# Patient Record
Sex: Male | Born: 1945
Health system: Southern US, Community
[De-identification: ages and names within clinical notes are randomized; demographics above are authoritative.]

## PROBLEM LIST (undated history)

## (undated) DIAGNOSIS — I10 Essential (primary) hypertension: Secondary | ICD-10-CM

## (undated) DIAGNOSIS — I509 Heart failure, unspecified: Secondary | ICD-10-CM

## (undated) DIAGNOSIS — R030 Elevated blood-pressure reading, without diagnosis of hypertension: Secondary | ICD-10-CM

## (undated) DIAGNOSIS — E119 Type 2 diabetes mellitus without complications: Secondary | ICD-10-CM

## (undated) DIAGNOSIS — K439 Ventral hernia without obstruction or gangrene: Secondary | ICD-10-CM

## (undated) DIAGNOSIS — IMO0001 Reserved for inherently not codable concepts without codable children: Secondary | ICD-10-CM

## (undated) DIAGNOSIS — I499 Cardiac arrhythmia, unspecified: Secondary | ICD-10-CM

## (undated) DIAGNOSIS — R42 Dizziness and giddiness: Secondary | ICD-10-CM

## (undated) DIAGNOSIS — N400 Enlarged prostate without lower urinary tract symptoms: Secondary | ICD-10-CM

## (undated) HISTORY — DX: Type 2 diabetes mellitus without complications: E11.9

## (undated) HISTORY — DX: Heart failure, unspecified: I50.9

## (undated) HISTORY — DX: Elevated blood-pressure reading, without diagnosis of hypertension: R03.0

## (undated) HISTORY — DX: Essential (primary) hypertension: I10

## (undated) HISTORY — DX: Benign prostatic hyperplasia without lower urinary tract symptoms: N40.0

## (undated) HISTORY — DX: Reserved for inherently not codable concepts without codable children: IMO0001

## (undated) HISTORY — DX: Dizziness and giddiness: R42

## (undated) HISTORY — DX: Cardiac arrhythmia, unspecified: I49.9

---

## 2015-10-16 DIAGNOSIS — S20469A Insect bite (nonvenomous) of unspecified back wall of thorax, initial encounter: Secondary | ICD-10-CM | POA: Diagnosis not present

## 2015-10-16 DIAGNOSIS — W57XXXA Bitten or stung by nonvenomous insect and other nonvenomous arthropods, initial encounter: Secondary | ICD-10-CM | POA: Diagnosis not present

## 2016-02-12 ENCOUNTER — Encounter (HOSPITAL_COMMUNITY): Payer: Self-pay | Admitting: *Deleted

## 2016-02-12 ENCOUNTER — Emergency Department (HOSPITAL_COMMUNITY): Payer: Medicare Other

## 2016-02-12 ENCOUNTER — Emergency Department (HOSPITAL_COMMUNITY)
Admission: EM | Admit: 2016-02-12 | Discharge: 2016-02-13 | Disposition: A | Discharge status: Home / Self Care | Payer: Medicare Other | Attending: Emergency Medicine | Admitting: Emergency Medicine

## 2016-02-12 DIAGNOSIS — R110 Nausea: Secondary | ICD-10-CM | POA: Insufficient documentation

## 2016-02-12 DIAGNOSIS — E669 Obesity, unspecified: Secondary | ICD-10-CM | POA: Diagnosis not present

## 2016-02-12 DIAGNOSIS — R42 Dizziness and giddiness: Secondary | ICD-10-CM | POA: Insufficient documentation

## 2016-02-12 DIAGNOSIS — R5383 Other fatigue: Secondary | ICD-10-CM | POA: Insufficient documentation

## 2016-02-12 DIAGNOSIS — R404 Transient alteration of awareness: Secondary | ICD-10-CM | POA: Diagnosis not present

## 2016-02-12 LAB — CBC
HCT: 41.5 % (ref 39.0–52.0)
Hemoglobin: 14.1 g/dL (ref 13.0–17.0)
MCH: 31.7 pg (ref 26.0–34.0)
MCHC: 34 g/dL (ref 30.0–36.0)
MCV: 93.3 fL (ref 78.0–100.0)
PLATELETS: 206 10*3/uL (ref 150–400)
RBC: 4.45 MIL/uL (ref 4.22–5.81)
RDW: 13.3 % (ref 11.5–15.5)
WBC: 9.5 10*3/uL (ref 4.0–10.5)

## 2016-02-12 NOTE — ED Notes (Signed)
Pt to ED from home by EMS c/o dizziness and weakness x 2 days, worsening today around 4pm. Has had nausea with one episode of emesis, in which nausea improved after emesis. Initial EMS ekg was sinus brady at rate of 52 with BBB, following EKG NSR with rate of 66. Pt denies nausea, no neuro deficits on exam

## 2016-02-12 NOTE — ED Provider Notes (Signed)
By signing my name below, I, Freida Busman, attest that this documentation has been prepared under the direction and in the presence of Osceola Depaz N Madigan Rosensteel, DO . Electronically Signed: Freida Busman, Scribe. 02/12/2016. 11:31 PM.  TIME SEEN: 11:14 PM  CHIEF COMPLAINT:  Chief Complaint  Patient presents with  . Dizziness  . Fatigue   HPI:  HPI Comments:  Adam Jennings is a 70 y.o. male who presents to the Emergency Department complaining dizziness that began this evening and lasted for 1 hour. Pt reports associated nausea without episode of vomiting. He describes the sensation as feeling like he's had too much to drink. He also notes "hot sweats" with the nausea. Pt states at this time all symptoms have resolved. He denies CP, abdominal pain, SOB, numbness/tingling/focal weakness, speech changes, hearing loss, tinnitus, severe HA and blood in his stool, melena. States he had a mild frontal headache with these symptoms that is gone. No modifying factors . He also denies h/o vertigo, DM, HTN, HLD, and CAD, CVA. Not on anticoagulation.  PCP was Dr. Jeanie Sewer, sees unknown physician in [place]  ROS: See HPI Constitutional: no fever  Eyes: no drainage  ENT: no runny nose   Cardiovascular:  no chest pain  Resp: no SOB  GI: no vomiting GU: no dysuria Integumentary: no rash  Allergy: no hives  Musculoskeletal: no leg swelling  Neurological: no slurred speech ROS otherwise negative  PAST MEDICAL HISTORY/PAST SURGICAL HISTORY:  History reviewed. No pertinent past medical history.  MEDICATIONS:  Prior to Admission medications   Not on File    ALLERGIES:  No Known Allergies  SOCIAL HISTORY:  Social History  Substance Use Topics  . Smoking status: Never Smoker   . Smokeless tobacco: Not on file  . Alcohol Use: No    FAMILY HISTORY: No family history on file.  EXAM: BP 150/67 mmHg  Pulse 56  Temp(Src) 97.6 F (36.4 C)  Resp 16  Ht 6' (1.829 m)  Wt 350 lb (158.759 kg)  BMI  47.46 kg/m2  SpO2 94% CONSTITUTIONAL: Alert and oriented and responds appropriately to questions. Well-appearing; obese HEAD: Normocephalic EYES: Conjunctivae clear, PERRL; strabismus of the right eye; EOMI ENT: normal nose; no rhinorrhea; moist mucous membranes NECK: Supple, no meningismus, no LAD  CARD: RRR; S1 and S2 appreciated; no murmurs, no clicks, no rubs, no gallops RESP: Normal chest excursion without splinting or tachypnea; breath sounds clear and equal bilaterally; no wheezes, no rhonchi, no rales, no hypoxia or respiratory distress, speaking full sentences ABD/GI: Normal bowel sounds; non-distended; soft, non-tender, no rebound, no guarding, no peritoneal signs BACK:  The back appears normal and is non-tender to palpation, there is no CVA tenderness EXT: Normal ROM in all joints; non-tender to palpation; no edema; normal capillary refill; no cyanosis, no calf tenderness or swelling    SKIN: Normal color for age and race; warm; no rash NEURO: Moves all extremities equally, sensation to light touch intact diffusely, cranial nerves II through XII intact; strength 5/5 all 4 extremites  PSYCH: The patient's mood and manner are appropriate. Grooming and personal hygiene are appropriate.   EKG Interpretation  Date/Time:  Tuesday February 12 2016 23:01:34 EDT Ventricular Rate:  55 PR Interval:  184 QRS Duration: 156 QT Interval:  505 QTC Calculation: 483 R Axis:   -62 Text Interpretation:  Sinus rhythm IVCD, consider atypical RBBB LVH with IVCD and secondary repol abnrm Borderline prolonged QT interval No old tracing to compare Confirmed by Wynonia Medero,  DO, Tanaka Gillen ([id number])  on 02/12/2016 11:07:01 PM       MEDICAL DECISION MAKING: Pt here With dizziness described as what sounds like vertigo, generalized weakness. Feeling better now. Asymptomatic currently. Symptoms lasted for 1 hour. Had mild frontal headache with this. Hemodynamically stable. Neurologically intact. They have been a brief  episode of BPPV versus TIA. Reports he felt like he was leaning to the left. Will obtain labs, urine, head CT. EKG shows intraventricular conduction delay with old for comparison.  ED PROGRESS: 12:45 AM  Pt's labs are unremarkable. Head CT shows no acute abnormality. He is still a symptomatic. Orthostatic vital signs negative. Will consult neurology for further recommendations.   1:00 AM  D/w Dr. Roseanne RenoStewart who is on-call for neurology. Given patient is still asymptomatic, he feels this is likely peripheral vertigo. Unlikely stroke which I agree given no symptoms currently. He does not recommend further imaging given patient's low risk factors. Recommended outpatient follow-up. He has been able to ambulate without difficulty.  2:00 AM  Pt is still symptomatic. Been able to drink without difficulty or vomiting.  Urine shows no sign of infection, no ketones. I feel he is safe to be discharged. Discussed return precautions with patient and family. Recommended outpatient follow-up with his primary care provider but have also provided him with outpatient neurology follow-up. Patient and family verbalize understanding and are comfortable with this plan.  I personally performed the services described in this documentation, which was scribed in my presence. The recorded information has been reviewed and is accurate.    Layla MawKristen N Phillippe Orlick, DO 02/13/16 [phone]

## 2016-02-13 DIAGNOSIS — R42 Dizziness and giddiness: Secondary | ICD-10-CM | POA: Diagnosis not present

## 2016-02-13 LAB — URINALYSIS, ROUTINE W REFLEX MICROSCOPIC
BILIRUBIN URINE: NEGATIVE
GLUCOSE, UA: NEGATIVE mg/dL
Hgb urine dipstick: NEGATIVE
KETONES UR: NEGATIVE mg/dL
LEUKOCYTES UA: NEGATIVE
NITRITE: NEGATIVE
PROTEIN: NEGATIVE mg/dL
Specific Gravity, Urine: 1.02 (ref 1.005–1.030)
pH: 6.5 (ref 5.0–8.0)

## 2016-02-13 LAB — BASIC METABOLIC PANEL
Anion gap: 7 (ref 5–15)
BUN: 11 mg/dL (ref 6–20)
CALCIUM: 9.3 mg/dL (ref 8.9–10.3)
CO2: 26 mmol/L (ref 22–32)
CREATININE: 0.94 mg/dL (ref 0.61–1.24)
Chloride: 105 mmol/L (ref 101–111)
GFR calc Af Amer: >60 mL/min (ref >60)
GLUCOSE: 158 mg/dL — AB (ref 65–99)
Potassium: 4.2 mmol/L (ref 3.5–5.1)
SODIUM: 138 mmol/L (ref 135–145)

## 2016-02-13 LAB — TROPONIN I: Troponin I: <0.03 ng/mL (ref <0.031)

## 2016-02-13 NOTE — ED Notes (Signed)
Ambulated Pt to bathroom. PT did not feel dizzy and claimed to feel much better/normal. RN notified.

## 2016-02-13 NOTE — Discharge Instructions (Signed)

## 2016-02-25 ENCOUNTER — Ambulatory Visit (INDEPENDENT_AMBULATORY_CARE_PROVIDER_SITE_OTHER): Payer: Medicare Other | Admitting: Primary Care

## 2016-02-25 ENCOUNTER — Encounter: Payer: Self-pay | Admitting: Primary Care

## 2016-02-25 VITALS — BP 162/90 | HR 67 | Temp 98.0°F | Ht 72.0 in | Wt 333.0 lb

## 2016-02-25 DIAGNOSIS — I10 Essential (primary) hypertension: Secondary | ICD-10-CM

## 2016-02-25 MED ORDER — LISINOPRIL 20 MG PO TABS: 20.0000 mg | ORAL_TABLET | Freq: Every day | ORAL | Status: DC

## 2016-02-25 NOTE — Progress Notes (Signed)
Subjective:    Patient ID: Adam Jennings, male    DOB: 14-Sep-1946, 70 y.o.   MRN: [id number]  HPI  Adam Jennings is a 70 year old male who presents today to establish care and discuss the problems mentioned below. Will obtain old records. His last physical was 3 years ago.   1) Elevated Blood Pressure: Elevated in the clinic today at 162/90. Denies prior history of Hypertension. His BP was elevated in the Emergency Department at 150/67, and 151/74 upon recheck. He endorses a headache to his bilateral occipital lobes that has been constant since his visit to the Emergency Department for dizziness on 02/12/16. He's taken Excedrin Migraine for his headaches with improvement for 1-2 hours. Denies chest pain, SOB, lower extremity swelling, changes in vision. FH of hypertension with his father. Denies FH of MI, CAD, Stroke.  2) Hospital Follow Up: Evaluated in the ED on 02/12/16 with a chief complaint of dizziness and fatigue. His dizziness had been present for 1 hour with nausea. Denies chest pain, SOB. His BP was slightly above goal in the ED at 150/67 that day. He underwent ECG which showed questionable RBBB with LVH, borderline prolonged QT. He underwent labs including Troponin, CBC, BMP, and UA which were unremarkable. CT scan of head was unremarkable. The ED physician did consult with neurology who believed no further work up was necessary. He was provided with instructions for outpatient follow up upon discharge if symptoms persisted. He was discharged early morning of 02/13/16.  Since his discharge home he's experienced 1 episode of dizziness 2 days after discharge, denies dizziness since. Denies numbness/tingling, changes in vision, chest pain. His main complaint today is the persistent headache to his occipital lobes since ED visit.  Review of Systems  Eyes: Negative for photophobia and visual disturbance.  Respiratory: Negative for shortness of breath.   Cardiovascular: Negative for chest pain and  leg swelling.  Gastrointestinal:       Endorses periumbilical hernia for 12 years  Musculoskeletal:       Chronic knee pain to right knee.   Neurological: Positive for headaches. Negative for dizziness.       Past Medical History  Diagnosis Date  . Elevated blood pressure   . Dizziness     Social History   Social History  . Marital Status: Married    Spouse Name: N/A  . Number of Children: N/A  . Years of Education: N/A   Occupational History  . Not on file.   Social History Main Topics  . Smoking status: Never Smoker   . Smokeless tobacco: Not on file  . Alcohol Use: No  . Drug Use: No  . Sexual Activity: Not on file   Other Topics Concern  . Not on file   Social History Narrative   Married.   1 daughter, 4 grandchildren.   Works in [employer] in [place].   Enjoys working on tractors.     No past surgical history on file.  Family History  Problem Relation Age of Onset  . Hypertension Father     No Known Allergies  Current Outpatient Prescriptions on File Prior to Visit  Medication Sig Dispense Refill  . Omega-3 Fatty Acids (FISH OIL) 1000 MG CAPS Take 1,000 mg by mouth daily.     No current facility-administered medications on file prior to visit.    BP 162/90 mmHg  Pulse 67  Temp(Src) 98 F (36.7 C) (Oral)  Ht 6' (1.829 m)  Wt 333 lb (151.048  kg)  BMI 45.15 kg/m2  SpO2 95%    Objective:   Physical Exam  Constitutional: He appears well-nourished.  Eyes: EOM are normal. Pupils are equal, round, and reactive to light.  Neck: Neck supple.  Cardiovascular: Normal rate and regular rhythm.   Pulmonary/Chest: Effort normal and breath sounds normal.  Neurological: No cranial nerve deficit.  Skin: Skin is warm and dry.  Psychiatric: He has a normal mood and affect.          Assessment & Plan:  Hospital Follow Up:  Evaluated in ED on 02/12/16 for dizziness and nausea. BP noted to be slightly above goal, even on recheck. All labs and imaging  unremarkable. ECG abnormal, no prior ECG on file to compare. Dizziness has dissipated since, now complains of headache. Exam unremarkable. Suspect headache, and possibly dizziness are related to undiagnosed HTN which is addressed in another section. Return precautions provided regarding dizziness.  All labs, imaging, and hospital notes reviewed.

## 2016-02-25 NOTE — Assessment & Plan Note (Signed)
No prior history, FH of HTN with father. Elevated today in clinic, elevated on 2 separate readings in ED 2 weeks ago. Suspect his headaches and dizziness are related and will treat. Start Lisinopril 20 mg tablets.  Also discussed importance of low salt diet and exercise. Follow up in 2 weeks for BMP and evaluation.

## 2016-02-25 NOTE — Progress Notes (Signed)
Pre visit review using our clinic review tool, if applicable. No additional management support is needed unless otherwise documented below in the visit note. 

## 2016-02-25 NOTE — Patient Instructions (Signed)
Start Lisinopril 20 mg for high blood pressure. Take 1 tablet by mouth every morning.  Take a look at the information below regarding a proper diet. Ensure to limit salty, fried, and fatty foods.  Follow up in 2 weeks for recheck of your blood pressure.  It was a pleasure to meet you today! Please don't hesitate to call me with any questions. Welcome to [facility]!   DASH Eating Plan DASH stands for "Dietary Approaches to Stop Hypertension." The DASH eating plan is a healthy eating plan that has been shown to reduce high blood pressure (hypertension). Additional health benefits may include reducing the risk of type 2 diabetes mellitus, heart disease, and stroke. The DASH eating plan may also help with weight loss. WHAT DO I NEED TO KNOW ABOUT THE DASH EATING PLAN? For the DASH eating plan, you will follow these general guidelines:  Choose foods with a percent daily value for sodium of less than 5% (as listed on the food label).  Use salt-free seasonings or herbs instead of table salt or sea salt.  Check with your health care provider or pharmacist before using salt substitutes.  Eat lower-sodium products, often labeled as "lower sodium" or "no salt added."  Eat fresh foods.  Eat more vegetables, fruits, and low-fat dairy products.  Choose whole grains. Look for the word "whole" as the first word in the ingredient list.  Choose fish and skinless chicken or [place] more often than red meat. Limit fish, poultry, and meat to 6 oz (170 g) each day.  Limit sweets, desserts, sugars, and sugary drinks.  Choose heart-healthy fats.  Limit cheese to 1 oz (28 g) per day.  Eat more home-cooked food and less restaurant, buffet, and fast food.  Limit fried foods.  Cook foods using methods other than frying.  Limit canned vegetables. If you do use them, rinse them well to decrease the sodium.  When eating at a restaurant, ask that your food be prepared with less salt, or no salt if  possible. WHAT FOODS CAN I EAT? Seek help from a dietitian for individual calorie needs. Grains Whole grain or whole wheat bread. Brown rice. Whole grain or whole wheat pasta. Quinoa, bulgur, and whole grain cereals. Low-sodium cereals. Corn or whole wheat flour tortillas. Whole grain cornbread. Whole grain crackers. Low-sodium crackers. Vegetables Fresh or frozen vegetables (raw, steamed, roasted, or grilled). Low-sodium or reduced-sodium tomato and vegetable juices. Low-sodium or reduced-sodium tomato sauce and paste. Low-sodium or reduced-sodium canned vegetables.  Fruits All fresh, canned (in natural juice), or frozen fruits. Meat and Other Protein Products Ground beef (85% or leaner), grass-fed beef, or beef trimmed of fat. Skinless chicken or [place]. Ground chicken or [place]. Pork trimmed of fat. All fish and seafood. Eggs. Dried beans, peas, or lentils. Unsalted nuts and seeds. Unsalted canned beans. Dairy Low-fat dairy products, such as skim or 1% milk, 2% or reduced-fat cheeses, low-fat ricotta or cottage cheese, or plain low-fat yogurt. Low-sodium or reduced-sodium cheeses. Fats and Oils Tub margarines without trans fats. Light or reduced-fat mayonnaise and salad dressings (reduced sodium). Avocado. Safflower, olive, or canola oils. Natural peanut or almond butter. Other Unsalted popcorn and pretzels. The items listed above may not be a complete list of recommended foods or beverages. Contact your dietitian for more options. WHAT FOODS ARE NOT RECOMMENDED? Grains White bread. White pasta. White rice. Refined cornbread. Bagels and croissants. Crackers that contain trans fat. Vegetables Creamed or fried vegetables. Vegetables in a cheese sauce. Regular canned vegetables. Regular  canned tomato sauce and paste. Regular tomato and vegetable juices. Fruits Dried fruits. Canned fruit in light or heavy syrup. Fruit juice. Meat and Other Protein Products Fatty cuts of meat. Ribs, chicken  wings, bacon, sausage, bologna, salami, chitterlings, fatback, hot dogs, bratwurst, and packaged luncheon meats. Salted nuts and seeds. Canned beans with salt. Dairy Whole or 2% milk, cream, half-and-half, and cream cheese. Whole-fat or sweetened yogurt. Full-fat cheeses or blue cheese. Nondairy creamers and whipped toppings. Processed cheese, cheese spreads, or cheese curds. Condiments Onion and garlic salt, seasoned salt, table salt, and sea salt. Canned and packaged gravies. Worcestershire sauce. Tartar sauce. Barbecue sauce. Teriyaki sauce. Soy sauce, including reduced sodium. Steak sauce. Fish sauce. Oyster sauce. Cocktail sauce. Horseradish. Ketchup and mustard. Meat flavorings and tenderizers. Bouillon cubes. Hot sauce. Tabasco sauce. Marinades. Taco seasonings. Relishes. Fats and Oils Butter, stick margarine, lard, shortening, ghee, and bacon fat. Coconut, palm kernel, or palm oils. Regular salad dressings. Other Pickles and olives. Salted popcorn and pretzels. The items listed above may not be a complete list of foods and beverages to avoid. Contact your dietitian for more information. WHERE CAN I FIND MORE INFORMATION? National Heart, Lung, and Blood Institute: [employer].itwww.nhlbi.nih.gov/health/health-topics/topics/dash/   This information is not intended to replace advice given to you by your health care provider. Make sure you discuss any questions you have with your health care provider.   Document Released: 10/30/2011 Document Revised: 12/01/2014 Document Reviewed: 09/14/2013 Elsevier Interactive Patient Education [employer]2016 Elsevier Inc.

## 2016-03-10 ENCOUNTER — Ambulatory Visit (INDEPENDENT_AMBULATORY_CARE_PROVIDER_SITE_OTHER): Payer: Medicare Other | Admitting: Primary Care

## 2016-03-10 ENCOUNTER — Encounter: Payer: Self-pay | Admitting: Primary Care

## 2016-03-10 VITALS — BP 144/84 | HR 64 | Temp 98.1°F | Ht 72.0 in | Wt 332.0 lb

## 2016-03-10 DIAGNOSIS — I10 Essential (primary) hypertension: Secondary | ICD-10-CM | POA: Diagnosis not present

## 2016-03-10 LAB — BASIC METABOLIC PANEL
BUN: 14 mg/dL (ref 6–23)
CALCIUM: 9.6 mg/dL (ref 8.4–10.5)
CHLORIDE: 103 meq/L (ref 96–112)
CO2: 29 mEq/L (ref 19–32)
CREATININE: 1.03 mg/dL (ref 0.40–1.50)
GFR: 76.27 mL/min (ref >60.00)
Glucose, Bld: 175 mg/dL — ABNORMAL HIGH (ref 70–99)
Potassium: 4.2 mEq/L (ref 3.5–5.1)
Sodium: 140 mEq/L (ref 135–145)

## 2016-03-10 NOTE — Assessment & Plan Note (Signed)
Stable and improved on Lisinopril 20 mg per current guidelines. Will continue Lisinopril for now, check BMP today. Will complete physical and BP recheck in 3 months.

## 2016-03-10 NOTE — Progress Notes (Signed)
   Subjective:    Patient ID: Adam Jennings, male    DOB: 02/09/1946, 70 y.o.   MRN: [id number]  HPI  Adam Jennings is a 70 year old male who presents today for follow up of hypertension. He was evaluated as a new patient on 02/25/16 and noted to have recent and prior elevated BP readings with dizziness and headaches. He was initiated on Lisinopril 20 mg last visit.   Since his last visit his BP is stable at 144/84 in the office today. He's not checked his BP since his last visit. His headaches had dissipated 3 days ago and he's feeling much better overall. Denies chest pain, dizziness, cough.   Review of Systems  Respiratory: Negative for cough and shortness of breath.   Cardiovascular: Negative for chest pain.  Neurological: Negative for dizziness and headaches.       Past Medical History  Diagnosis Date  . Elevated blood pressure   . Dizziness      Social History   Social History  . Marital Status: Married    Spouse Name: N/A  . Number of Children: N/A  . Years of Education: N/A   Occupational History  . Not on file.   Social History Main Topics  . Smoking status: Never Smoker   . Smokeless tobacco: Not on file  . Alcohol Use: No  . Drug Use: No  . Sexual Activity: Not on file   Other Topics Concern  . Not on file   Social History Narrative   Married.   1 daughter, 4 grandchildren.   Works in [employer] in [place].   Enjoys working on tractors.     No past surgical history on file.  Family History  Problem Relation Age of Onset  . Hypertension Father     No Known Allergies  Current Outpatient Prescriptions on File Prior to Visit  Medication Sig Dispense Refill  . lisinopril (PRINIVIL,ZESTRIL) 20 MG tablet Take 1 tablet (20 mg total) by mouth daily. 30 tablet 3  . Omega-3 Fatty Acids (FISH OIL) 1000 MG CAPS Take 1,000 mg by mouth daily.     No current facility-administered medications on file prior to visit.    BP 144/84 mmHg  Pulse 64  Temp(Src) 98.1 F  (36.7 C) (Oral)  Ht 6' (1.829 m)  Wt 332 lb (150.594 kg)  BMI 45.02 kg/m2  SpO2 96%    Objective:   Physical Exam  Constitutional: He appears well-nourished.  Cardiovascular: Normal rate and regular rhythm.   Pulmonary/Chest: Effort normal and breath sounds normal.  Skin: Skin is warm and dry.          Assessment & Plan:

## 2016-03-10 NOTE — Patient Instructions (Signed)
Complete lab work prior to leaving today. I will notify you of your results once received.   Please schedule a physical with me in 3 months. We will also follow up on your blood pressure at that time. You may also schedule a lab only appointment 3-4 days prior. We will discuss your lab results in detail during your physical.  It was a pleasure to see you today!  Hypertension Hypertension, commonly called high blood pressure, is when the force of blood pumping through your arteries is too strong. Your arteries are the blood vessels that carry blood from your heart throughout your body. A blood pressure reading consists of a higher number over a lower number, such as 110/72. The higher number (systolic) is the pressure inside your arteries when your heart pumps. The lower number (diastolic) is the pressure inside your arteries when your heart relaxes. Ideally you want your blood pressure below 120/80. Hypertension forces your heart to work harder to pump blood. Your arteries may become narrow or stiff. Having untreated or uncontrolled hypertension can cause heart attack, stroke, kidney disease, and other problems. RISK FACTORS Some risk factors for high blood pressure are controllable. Others are not.  Risk factors you cannot control include:   Race. You may be at higher risk if you are African American.  Age. Risk increases with age.  Gender. Men are at higher risk than women before age 68 years. After age 4, women are at higher risk than men. Risk factors you can control include:  Not getting enough exercise or physical activity.  Being overweight.  Getting too much fat, sugar, calories, or salt in your diet.  Drinking too much alcohol. SIGNS AND SYMPTOMS Hypertension does not usually cause signs or symptoms. Extremely high blood pressure (hypertensive crisis) may cause headache, anxiety, shortness of breath, and nosebleed. DIAGNOSIS To check if you have hypertension, your health care  provider will measure your blood pressure while you are seated, with your arm held at the level of your heart. It should be measured at least twice using the same arm. Certain conditions can cause a difference in blood pressure between your right and left arms. A blood pressure reading that is higher than normal on one occasion does not mean that you need treatment. If it is not clear whether you have high blood pressure, you may be asked to return on a different day to have your blood pressure checked again. Or, you may be asked to monitor your blood pressure at home for 1 or more weeks. TREATMENT Treating high blood pressure includes making lifestyle changes and possibly taking medicine. Living a healthy lifestyle can help lower high blood pressure. You may need to change some of your habits. Lifestyle changes may include:  Following the DASH diet. This diet is high in fruits, vegetables, and whole grains. It is low in salt, red meat, and added sugars.  Keep your sodium intake below 2,300 mg per day.  Getting at least 30-45 minutes of aerobic exercise at least 4 times per week.  Losing weight if necessary.  Not smoking.  Limiting alcoholic beverages.  Learning ways to reduce stress. Your health care provider may prescribe medicine if lifestyle changes are not enough to get your blood pressure under control, and if one of the following is true:  You are 69-58 years of age and your systolic blood pressure is above 140.  You are 77 years of age or older, and your systolic blood pressure is above 150.  Your diastolic blood pressure is above 90.  You have diabetes, and your systolic blood pressure is over 140 or your diastolic blood pressure is over 90.  You have kidney disease and your blood pressure is above 140/90.  You have heart disease and your blood pressure is above 140/90. Your personal target blood pressure may vary depending on your medical conditions, your age, and other  factors. HOME CARE INSTRUCTIONS  Have your blood pressure rechecked as directed by your health care provider.   Take medicines only as directed by your health care provider. Follow the directions carefully. Blood pressure medicines must be taken as prescribed. The medicine does not work as well when you skip doses. Skipping doses also puts you at risk for problems.  Do not smoke.   Monitor your blood pressure at home as directed by your health care provider. SEEK MEDICAL CARE IF:   You think you are having a reaction to medicines taken.  You have recurrent headaches or feel dizzy.  You have swelling in your ankles.  You have trouble with your vision. SEEK IMMEDIATE MEDICAL CARE IF:  You develop a severe headache or confusion.  You have unusual weakness, numbness, or feel faint.  You have severe chest or abdominal pain.  You vomit repeatedly.  You have trouble breathing. MAKE SURE YOU:   Understand these instructions.  Will watch your condition.  Will get help right away if you are not doing well or get worse.   This information is not intended to replace advice given to you by your health care provider. Make sure you discuss any questions you have with your health care provider.   Document Released: 11/10/2005 Document Revised: 03/27/2015 Document Reviewed: 09/02/2013 Elsevier Interactive Patient Education [employer]2016 Elsevier Inc.

## 2016-03-10 NOTE — Progress Notes (Signed)
Pre visit review using our clinic review tool, if applicable. No additional management support is needed unless otherwise documented below in the visit note. 

## 2016-03-11 ENCOUNTER — Encounter: Payer: Self-pay | Admitting: *Deleted

## 2016-04-18 ENCOUNTER — Ambulatory Visit (INDEPENDENT_AMBULATORY_CARE_PROVIDER_SITE_OTHER): Payer: Medicare Other | Admitting: Primary Care

## 2016-04-18 ENCOUNTER — Ambulatory Visit (INDEPENDENT_AMBULATORY_CARE_PROVIDER_SITE_OTHER)
Admission: RE | Admit: 2016-04-18 | Discharge: 2016-04-18 | Disposition: A | Discharge status: Home / Self Care | Payer: Medicare Other | Source: Ambulatory Visit | Attending: Primary Care | Admitting: Primary Care

## 2016-04-18 VITALS — BP 138/70 | HR 63 | Temp 97.7°F | Ht 72.0 in | Wt 329.0 lb

## 2016-04-18 DIAGNOSIS — M25512 Pain in left shoulder: Secondary | ICD-10-CM

## 2016-04-18 DIAGNOSIS — S4992XA Unspecified injury of left shoulder and upper arm, initial encounter: Secondary | ICD-10-CM | POA: Diagnosis not present

## 2016-04-18 NOTE — Progress Notes (Signed)
Pre visit review using our clinic review tool, if applicable. No additional management support is needed unless otherwise documented below in the visit note. 

## 2016-04-18 NOTE — Patient Instructions (Signed)
Complete xray(s) prior to leaving today. I will notify you of your results once received.  Start Ibuprofen 600 mg. Take 600 mg three times daily as needed for pain and inflammation.  Apply a heat/ice to your left shoulder for pain three times daily for about 20-30 minutes.  Please do not lift anything heavier than 5 pounds at this time as you could cause further injury.  I will be in touch later today.  It was a pleasure to see you today!  Shoulder Pain The shoulder is the joint that connects your arms to your body. The bones that form the shoulder joint include the upper arm bone (humerus), the shoulder blade (scapula), and the collarbone (clavicle). The top of the humerus is shaped like a ball and fits into a rather flat socket on the scapula (glenoid cavity). A combination of muscles and strong, fibrous tissues that connect muscles to bones (tendons) support your shoulder joint and hold the ball in the socket. Small, fluid-filled sacs (bursae) are located in different areas of the joint. They act as cushions between the bones and the overlying soft tissues and help reduce friction between the gliding tendons and the bone as you move your arm. Your shoulder joint allows a wide range of motion in your arm. This range of motion allows you to do things like scratch your back or throw a ball. However, this range of motion also makes your shoulder more prone to pain from overuse and injury. Causes of shoulder pain can originate from both injury and overuse and usually can be grouped in the following four categories:  Redness, swelling, and pain (inflammation) of the tendon (tendinitis) or the bursae (bursitis).  Instability, such as a dislocation of the joint.  Inflammation of the joint (arthritis).  Broken bone (fracture). HOME CARE INSTRUCTIONS   Apply ice to the sore area.  Put ice in a plastic bag.  Place a towel between your skin and the bag.  Leave the ice on for 15-20 minutes, 3-4  times per day for the first 2 days, or as directed by your health care provider.  Stop using cold packs if they do not help with the pain.  If you have a shoulder sling or immobilizer, wear it as long as your caregiver instructs. Only remove it to shower or bathe. Move your arm as little as possible, but keep your hand moving to prevent swelling.  Squeeze a soft ball or foam pad as much as possible to help prevent swelling.  Only take over-the-counter or prescription medicines for pain, discomfort, or fever as directed by your caregiver. SEEK MEDICAL CARE IF:   Your shoulder pain increases, or new pain develops in your arm, hand, or fingers.  Your hand or fingers become cold and numb.  Your pain is not relieved with medicines. SEEK IMMEDIATE MEDICAL CARE IF:   Your arm, hand, or fingers are numb or tingling.  Your arm, hand, or fingers are significantly swollen or turn white or blue. MAKE SURE YOU:   Understand these instructions.  Will watch your condition.  Will get help right away if you are not doing well or get worse.   This information is not intended to replace advice given to you by your health care provider. Make sure you discuss any questions you have with your health care provider.   Document Released: 08/20/2005 Document Revised: 12/01/2014 Document Reviewed: 03/05/2015 Elsevier Interactive Patient Education [employer]2016 Elsevier Inc.

## 2016-04-18 NOTE — Progress Notes (Signed)
   Subjective:    Patient ID: Adam Jennings, male    DOB: 02/05/1946, 70 y.o.   MRN: [id number]  HPI  [name] is a 70 year old male who presents today with a chief complaint of shoulder pain. His pain is located to the left shoulder and has been present since his fall about 2 weeks ago. He accidentally tripped and fell down into an embankment landing on his left shoulder which was fully extended.   His pain initially didn't bother him until 1 week ago. He describes his pain as a "toothache" and is worse when laying down, improved with sitting up. He's been lifting heavy objects during the last several weeks at work, even after his injury. His pain is present with any abduction. He's not taken anything for his pain. He's not tried applying ice or heat. His pain is no worse, but is no better.  Review of Systems  Musculoskeletal:       Left shoulder pain  Neurological: Negative for numbness.       Past Medical History  Diagnosis Date  . Elevated blood pressure   . Dizziness      Social History   Social History  . Marital Status: Married    Spouse Name: N/A  . Number of Children: N/A  . Years of Education: N/A   Occupational History  . Not on file.   Social History Main Topics  . Smoking status: Never Smoker   . Smokeless tobacco: Not on file  . Alcohol Use: No  . Drug Use: No  . Sexual Activity: Not on file   Other Topics Concern  . Not on file   Social History Narrative   Married.   1 daughter, 4 grandchildren.   Works in [employer] [place]   Enjoys working on tractors.     No past surgical history on file.  Family History  Problem Relation Age of Onset  . Hypertension Father     No Known Allergies  Current Outpatient Prescriptions on File Prior to Visit  Medication Sig Dispense Refill  . lisinopril (PRINIVIL,ZESTRIL) 20 MG tablet Take 1 tablet (20 mg total) by mouth daily. 30 tablet 3  . Omega-3 Fatty Acids (FISH OIL) 1000 MG CAPS Take 1,000 mg by mouth  daily.     No current facility-administered medications on file prior to visit.    BP 138/70 mmHg  Pulse 63  Temp(Src) 97.7 F (36.5 C) (Oral)  Ht 6' (1.829 m)  Wt 329 lb (149.233 kg)  BMI 44.61 kg/m2  SpO2 93%    Objective:   Physical Exam  Constitutional: He appears well-nourished.  Cardiovascular: Normal rate and regular rhythm.   Pulmonary/Chest: Effort normal and breath sounds normal.  Musculoskeletal:       Left shoulder: He exhibits decreased range of motion, pain and decreased strength. He exhibits no tenderness and no deformity.  Moderate reduction in ROM with any form of abduction.  Skin: Skin is warm and dry.          Assessment & Plan:  Shoulder Pain:  Located to left shoulder x 1 week. Patient fell on fully extended left shoulder 2 weeks ago. Exam today with moderate reduction in ROM with any form of abduction to left shoulder. He's not tried anything OTC for his symptoms. No obvious deformity or dislocation. Xray ordered and is pending. Will have him start Ibuprofen TID, PRN. Apply ice/heat TID. May need MRI.

## 2016-07-11 ENCOUNTER — Other Ambulatory Visit: Payer: Self-pay | Admitting: Primary Care

## 2016-07-11 DIAGNOSIS — I10 Essential (primary) hypertension: Secondary | ICD-10-CM

## 2016-08-20 ENCOUNTER — Telehealth: Payer: Self-pay

## 2016-08-20 NOTE — Telephone Encounter (Signed)
Noted. Per our records, patient is not on a blood thinner unless he takes daily Aspirin.

## 2016-08-20 NOTE — Telephone Encounter (Signed)
Adam Jennings [facility] said pt is at office for tooth extraction. Pt advised Adam Jennings that he thinks he takes a blood thinner that is 2 mg and pinkish color. [facility] and pt does not have blood thinner there either. Pharmacist said the Lisinopril 20 mg is a peach color. I advised Adam Jennings if pt thinks he is on blood thinner need name of med. Adam Jennings will talk with dentist and pt. FYI to Adam ReelKate Clark NP.

## 2016-10-09 ENCOUNTER — Ambulatory Visit (INDEPENDENT_AMBULATORY_CARE_PROVIDER_SITE_OTHER): Payer: Medicare Other | Admitting: Primary Care

## 2016-10-09 ENCOUNTER — Ambulatory Visit (INDEPENDENT_AMBULATORY_CARE_PROVIDER_SITE_OTHER)
Admission: RE | Admit: 2016-10-09 | Discharge: 2016-10-09 | Disposition: A | Discharge status: Home / Self Care | Payer: Medicare Other | Source: Ambulatory Visit | Attending: Primary Care | Admitting: Primary Care

## 2016-10-09 ENCOUNTER — Encounter: Payer: Self-pay | Admitting: Primary Care

## 2016-10-09 VITALS — BP 130/74 | HR 65 | Temp 97.9°F | Ht 72.0 in | Wt 327.4 lb

## 2016-10-09 DIAGNOSIS — G8929 Other chronic pain: Secondary | ICD-10-CM | POA: Diagnosis not present

## 2016-10-09 DIAGNOSIS — M25561 Pain in right knee: Secondary | ICD-10-CM

## 2016-10-09 MED ORDER — DICLOFENAC SODIUM 75 MG PO TBEC: 75.0000 mg | DELAYED_RELEASE_TABLET | Freq: Two times a day (BID) | ORAL | 0 refills | Status: DC

## 2016-10-09 NOTE — Assessment & Plan Note (Signed)
Present since May 2017, worse over last 3-4 weeks. Given injury in May, will obtain plain films for further evaluation.  Discussed to obtain brace for support.  Rx for Diclofenac sent to pharmacy. May consider PT/Sports Med referral.

## 2016-10-09 NOTE — Progress Notes (Signed)
Pre visit review using our clinic review tool, if applicable. No additional management support is needed unless otherwise documented below in the visit note. 

## 2016-10-09 NOTE — Patient Instructions (Signed)
Complete xray(s) prior to leaving today. I will notify you of your results once received.  You may take diclofenac tablets as needed for pain and inflammation. Take 1 tablet by mouth twice daily with meals as needed for pain.  It was a pleasure to see you today!   Knee Pain Knee pain is a very common symptom and can have many causes. Knee pain often goes away when you follow your health care provider's instructions for relieving pain and discomfort at home. However, knee pain can develop into a condition that needs treatment. Some conditions may include:  Arthritis caused by wear and tear (osteoarthritis).  Arthritis caused by swelling and irritation (rheumatoid arthritis or gout).  A cyst or growth in your knee.  An infection in your knee joint.  An injury that will not heal.  Damage, swelling, or irritation of the tissues that support your knee (torn ligaments or tendinitis). If your knee pain continues, additional tests may be ordered to diagnose your condition. Tests may include X-rays or other imaging studies of your knee. You may also need to have fluid removed from your knee. Treatment for ongoing knee pain depends on the cause, but treatment may include:  Medicines to relieve pain or swelling.  Steroid injections in your knee.  Physical therapy.  Surgery. HOME CARE INSTRUCTIONS  Take medicines only as directed by your health care provider.  Rest your knee and keep it raised (elevated) while you are resting.  Do not do things that cause or worsen pain.  Avoid high-impact activities or exercises, such as running, jumping rope, or doing jumping jacks.  Apply ice to the knee area:  Put ice in a plastic bag.  Place a towel between your skin and the bag.  Leave the ice on for 20 minutes, 2-3 times a day.  Ask your health care provider if you should wear an elastic knee support.  Keep a pillow under your knee when you sleep.  Lose weight if you are overweight.  Extra weight can put pressure on your knee.  Do not use any tobacco products, including cigarettes, chewing tobacco, or electronic cigarettes. If you need help quitting, ask your health care provider. Smoking may slow the healing of any bone and joint problems that you may have. SEEK MEDICAL CARE IF:  Your knee pain continues, changes, or gets worse.  You have a fever along with knee pain.  Your knee buckles or locks up.  Your knee becomes more swollen. SEEK IMMEDIATE MEDICAL CARE IF:   Your knee joint feels hot to the touch.  You have chest pain or trouble breathing. This information is not intended to replace advice given to you by your health care provider. Make sure you discuss any questions you have with your health care provider. Document Released: 09/07/2007 Document Revised: 12/01/2014 Document Reviewed: 06/26/2014 Elsevier Interactive Patient Education  2017 [employer]

## 2016-10-09 NOTE — Progress Notes (Signed)
   Subjective:    Patient ID: Adam Jennings, male    DOB: 11/05/1946, 70 y.o.   MRN: [id number]  HPI  [name] is a 70 year old male who presents today with a chief complaint of knee pain. His pain is located to the right knee which has been present since his fall in May 2017. Over the 3-4 weeks his pain has increased, also with temporary weakness until he starts walking for a few minutes.   He denies recent injury/trauma, numbness/tingling. His pain is constant, worse with flexion, also worse with standing after sitting for prolonged periods of time. He's had imaging of his knees several years ago and was told they were "wore out". He's taking "joint pills" OTC without much improvement.   Review of Systems  Cardiovascular: Negative for leg swelling.  Musculoskeletal: Positive for arthralgias.  Skin: Negative for color change.  Neurological: Positive for weakness. Negative for numbness.       Past Medical History:  Diagnosis Date  . Dizziness   . Elevated blood pressure      Social History   Social History  . Marital status: Married    Spouse name: N/A  . Number of children: N/A  . Years of education: N/A   Occupational History  . Not on file.   Social History Main Topics  . Smoking status: Never Smoker  . Smokeless tobacco: Not on file  . Alcohol use No  . Drug use: No  . Sexual activity: Not on file   Other Topics Concern  . Not on file   Social History Narrative   Married.   1 daughter, 4 grandchildren.   Works in [employer] [place]   Enjoys working on tractors.     No past surgical history on file.  Family History  Problem Relation Age of Onset  . Hypertension Father     No Known Allergies  Current Outpatient Prescriptions on File Prior to Visit  Medication Sig Dispense Refill  . lisinopril (PRINIVIL,ZESTRIL) 20 MG tablet TAKE 1 TABLET (20 MG TOTAL) BY MOUTH DAILY. 30 tablet 5  . Omega-3 Fatty Acids (FISH OIL) 1000 MG CAPS Take 1,000 mg by mouth  daily.     No current facility-administered medications on file prior to visit.     BP 130/74   Pulse 65   Temp 97.9 F (36.6 C) (Oral)   Ht 6' (1.829 m)   Wt (!) 327 lb 6.4 oz (148.5 kg)   SpO2 98%   BMI 44.40 kg/m    Objective:   Physical Exam  Constitutional: He appears well-nourished.  Neck: Neck supple.  Cardiovascular: Normal rate.   Pulmonary/Chest: Effort normal.  Musculoskeletal:       Right knee: He exhibits decreased range of motion. He exhibits no swelling, normal alignment and no bony tenderness. No tenderness found.  Decreased ROM with flexion and extension. 5/5 strength bilaterally  Skin: Skin is warm and dry.          Assessment & Plan:

## 2016-11-19 ENCOUNTER — Other Ambulatory Visit: Payer: Self-pay | Admitting: Primary Care

## 2016-11-19 DIAGNOSIS — M25561 Pain in right knee: Principal | ICD-10-CM

## 2016-11-19 DIAGNOSIS — G8929 Other chronic pain: Secondary | ICD-10-CM

## 2017-01-10 ENCOUNTER — Other Ambulatory Visit: Payer: Self-pay | Admitting: Primary Care

## 2017-01-10 DIAGNOSIS — I10 Essential (primary) hypertension: Secondary | ICD-10-CM

## 2017-01-21 ENCOUNTER — Telehealth: Payer: Self-pay | Admitting: Primary Care

## 2017-01-21 NOTE — Telephone Encounter (Signed)
LVM for pt to call back and schedule AWV + labs with Lesia and CPE with PCP. °

## 2017-02-12 DIAGNOSIS — M25562 Pain in left knee: Secondary | ICD-10-CM | POA: Diagnosis not present

## 2017-02-14 NOTE — Telephone Encounter (Signed)
Scheduled 03/05/17

## 2017-03-04 ENCOUNTER — Other Ambulatory Visit: Payer: Self-pay | Admitting: Primary Care

## 2017-03-04 DIAGNOSIS — I10 Essential (primary) hypertension: Secondary | ICD-10-CM

## 2017-03-04 DIAGNOSIS — Z125 Encounter for screening for malignant neoplasm of prostate: Secondary | ICD-10-CM

## 2017-03-05 ENCOUNTER — Encounter (INDEPENDENT_AMBULATORY_CARE_PROVIDER_SITE_OTHER): Payer: Self-pay

## 2017-03-05 ENCOUNTER — Ambulatory Visit (INDEPENDENT_AMBULATORY_CARE_PROVIDER_SITE_OTHER): Payer: Medicare Other

## 2017-03-05 VITALS — BP 124/62 | HR 71 | Temp 98.4°F | Ht 70.25 in | Wt 322.5 lb

## 2017-03-05 DIAGNOSIS — I10 Essential (primary) hypertension: Secondary | ICD-10-CM | POA: Diagnosis not present

## 2017-03-05 DIAGNOSIS — Z0000 Encounter for general adult medical examination without abnormal findings: Secondary | ICD-10-CM | POA: Diagnosis not present

## 2017-03-05 DIAGNOSIS — Z125 Encounter for screening for malignant neoplasm of prostate: Secondary | ICD-10-CM

## 2017-03-05 DIAGNOSIS — M25562 Pain in left knee: Secondary | ICD-10-CM | POA: Diagnosis not present

## 2017-03-05 DIAGNOSIS — Z1159 Encounter for screening for other viral diseases: Secondary | ICD-10-CM

## 2017-03-05 LAB — COMPREHENSIVE METABOLIC PANEL
ALT: 34 U/L (ref 0–53)
AST: 25 U/L (ref 0–37)
Albumin: 4.4 g/dL (ref 3.5–5.2)
Alkaline Phosphatase: 42 U/L (ref 39–117)
BUN: 20 mg/dL (ref 6–23)
CHLORIDE: 104 meq/L (ref 96–112)
CO2: 28 meq/L (ref 19–32)
CREATININE: 1.08 mg/dL (ref 0.40–1.50)
Calcium: 9.5 mg/dL (ref 8.4–10.5)
GFR: 72 mL/min (ref >60.00)
Glucose, Bld: 141 mg/dL — ABNORMAL HIGH (ref 70–99)
Potassium: 4.7 mEq/L (ref 3.5–5.1)
SODIUM: 140 meq/L (ref 135–145)
Total Bilirubin: 0.7 mg/dL (ref 0.2–1.2)
Total Protein: 7.2 g/dL (ref 6.0–8.3)

## 2017-03-05 LAB — LIPID PANEL
CHOL/HDL RATIO: 8
Cholesterol: 255 mg/dL — ABNORMAL HIGH (ref 0–200)
HDL: 32.9 mg/dL — ABNORMAL LOW (ref >39.00)
LDL CALC: 189 mg/dL — AB (ref 0–99)
NonHDL: 222.51
Triglycerides: 169 mg/dL — ABNORMAL HIGH (ref 0.0–149.0)
VLDL: 33.8 mg/dL (ref 0.0–40.0)

## 2017-03-05 LAB — PSA, MEDICARE: PSA: 7.05 ng/mL — AB (ref 0.10–4.00)

## 2017-03-05 NOTE — Progress Notes (Signed)
Pre visit review using our clinic review tool, if applicable. No additional management support is needed unless otherwise documented below in the visit note. 

## 2017-03-05 NOTE — Progress Notes (Signed)
PCP notes:   Health maintenance:  Tetanus - declined Flu vaccine - declined PNA vaccine - declined Hep C screening - completed Colon cancer screening - pt will discuss with PCP at next appt  Abnormal screenings:   None  Patient concerns:   None  Nurse concerns:  None  Next PCP appt:   03/11/17 @ 0815  I reviewed health advisor's note, was available for consultation on the day of service listed in this note, and agree with documentation and plan. Crawford Givens, MD.

## 2017-03-05 NOTE — Patient Instructions (Addendum)
Adam Jennings , Thank you for taking time to come for your Medicare Wellness Visit. I appreciate your ongoing commitment to your health goals. Please review the following plan we discussed and let me know if I can assist you in the future.   These are the goals we discussed: Goals    . Increase physical activity          Starting 03/05/2017, I will continue to walk at least 2.5 hours daily.        This is a list of the screening recommended for you and due dates:  Health Maintenance  Topic Date Due  . Tetanus Vaccine  03/23/2026*  . Pneumonia vaccines (1 of 2 - PCV13) 03/23/2026*  . Colon Cancer Screening  03/06/2027*  . Flu Shot  06/24/2017  .  Hepatitis C: One time screening is recommended by Center for Disease Control  (CDC) for  adults born from [birth year] through 1963.   Completed  *Topic was postponed. The date shown is not the original due date.    Preventive Care for Adults  A healthy lifestyle and preventive care can promote health and wellness. Preventive health guidelines for adults include the following key practices.  . A routine yearly physical is a good way to check with your health care provider about your health and preventive screening. It is a chance to share any concerns and updates on your health and to receive a thorough exam.  . Visit your dentist for a routine exam and preventive care every 6 months. Brush your teeth twice a day and floss once a day. Good oral hygiene prevents tooth decay and gum disease.  . The frequency of eye exams is based on your age, health, family medical history, use  of contact lenses, and other factors. Follow your health care provider's ecommendations for frequency of eye exams.  . Eat a healthy diet. Foods like vegetables, fruits, whole grains, low-fat dairy products, and lean protein foods contain the nutrients you need without too many calories. Decrease your intake of foods high in solid fats, added sugars, and salt. Eat the right amount  of calories for you. Get information about a proper diet from your health care provider, if necessary.  . Regular physical exercise is one of the most important things you can do for your health. Most adults should get at least 150 minutes of moderate-intensity exercise (any activity that increases your heart rate and causes you to sweat) each week. In addition, most adults need muscle-strengthening exercises on 2 or more days a week.  Silver Sneakers may be a benefit available to you. To determine eligibility, you may visit the website: www.silversneakers.com or contact program at 531-401-9060 Mon-Fri between 8AM-8PM.   . Maintain a healthy weight. The body mass index (BMI) is a screening tool to identify possible weight problems. It provides an estimate of body fat based on height and weight. Your health care provider can find your BMI and can help you achieve or maintain a healthy weight.   For adults 20 years and older: ? A BMI below 18.5 is considered underweight. ? A BMI of 18.5 to 24.9 is normal. ? A BMI of 25 to 29.9 is considered overweight. ? A BMI of 30 and above is considered obese.   . Maintain normal blood lipids and cholesterol levels by exercising and minimizing your intake of saturated fat. Eat a balanced diet with plenty of fruit and vegetables. Blood tests for lipids and cholesterol should begin at age  20 and be repeated every 5 years. If your lipid or cholesterol levels are high, you are over 50, or you are at high risk for heart disease, you may need your cholesterol levels checked more frequently. Ongoing high lipid and cholesterol levels should be treated with medicines if diet and exercise are not working.  . If you smoke, find out from your health care provider how to quit. If you do not use tobacco, please do not start.  . If you choose to drink alcohol, please do not consume more than 2 drinks per day. One drink is considered to be 12 ounces (355 mL) of beer, 5 ounces  (148 mL) of wine, or 1.5 ounces (44 mL) of liquor.  . If you are 69-54 years old, ask your health care provider if you should take aspirin to prevent strokes.  . Use sunscreen. Apply sunscreen liberally and repeatedly throughout the day. You should seek shade when your shadow is shorter than you. Protect yourself by wearing long sleeves, pants, a wide-brimmed hat, and sunglasses year round, whenever you are outdoors.  . Once a month, do a whole body skin exam, using a mirror to look at the skin on your back. Tell your health care provider of new moles, moles that have irregular borders, moles that are larger than a pencil eraser, or moles that have changed in shape or color.

## 2017-03-05 NOTE — Progress Notes (Signed)
Subjective:   Adam Jennings is a 71 y.o. male who presents for an Initial Medicare Annual Wellness Visit.  Review of Systems  N/A Cardiac Risk Factors include: advanced age (>32men, >71 women);obesity (BMI >30kg/m2);male gender;hypertension    Objective:    Today's Vitals   03/05/17 1317  BP: 124/62  Pulse: 71  Temp: 98.4 F (36.9 C)  TempSrc: Oral  SpO2: 97%  Weight: (!) 322 lb 8 oz (146.3 kg)  Height: 5' 10.25" (1.784 m)  PainSc: 0-No pain   Body mass index is 45.95 kg/m.  Current Medications (verified) Outpatient Encounter Prescriptions as of 03/05/2017  Medication Sig  . diclofenac (VOLTAREN) 75 MG EC tablet TAKE 1 TABLET (75 MG TOTAL) BY MOUTH 2 (TWO) TIMES DAILY WITH A MEAL.  Marland Kitchen lisinopril (PRINIVIL,ZESTRIL) 20 MG tablet TAKE 1 TABLET (20 MG TOTAL) BY MOUTH DAILY.  Marland Kitchen Omega-3 Fatty Acids (FISH OIL) 1000 MG CAPS Take 1,000 mg by mouth daily.   No facility-administered encounter medications on file as of 03/05/2017.     Allergies (verified) Patient has no known allergies.   History: Past Medical History:  Diagnosis Date  . Dizziness   . Elevated blood pressure    History reviewed. No pertinent surgical history. Family History  Problem Relation Age of Onset  . Hypertension Father    Social History   Occupational History  . Not on file.   Social History Main Topics  . Smoking status: Never Smoker  . Smokeless tobacco: Never Used  . Alcohol use No  . Drug use: No  . Sexual activity: Not on file   Tobacco Counseling Counseling given: No   Activities of Daily Living In your present state of health, do you have any difficulty performing the following activities: 03/05/2017  Hearing? N  Vision? N  Difficulty concentrating or making decisions? N  Walking or climbing stairs? N  Dressing or bathing? N  Doing errands, shopping? N  Preparing Food and eating ? N  Using the Toilet? N  In the past six months, have you accidently leaked urine? Y  Do  you have problems with loss of bowel control? N  Managing your Medications? N  Managing your Finances? N  Housekeeping or managing your Housekeeping? N  Some recent data might be hidden    Immunizations and Health Maintenance  There is no immunization history on file for this patient. There are no preventive care reminders to display for this patient.  Patient Care Team: Doreene Nest, NP as PCP - General (Internal Medicine)    Assessment:   This is a routine wellness examination for Adam Jennings.   Hearing/Vision screen  Hearing Screening             Right ear:   40 40 40  40    Left ear:   40 40 40  40      Dietary issues and exercise activities discussed: Current Exercise Habits: Home exercise routine, Type of exercise: walking, Time (Minutes): > 60 (2.5 hours), Frequency (Times/Week): 7, Weekly Exercise (Minutes/Week): 0, Intensity: Mild, Exercise limited by: None identified  Goals    . Increase physical activity          Starting 03/05/2017, I will continue to walk at least 2.5 hours daily.       Depression Screen PHQ 2/9 Scores 03/05/2017  PHQ - 2 Score 0    Fall Risk Fall Risk  03/05/2017  Falls in the past year? No    Cognitive Function:  MMSE - Mini Mental State Exam 03/05/2017  Orientation to time 5  Orientation to Place 5  Registration 3  Attention/ Calculation 0  Recall 3  Language- name 2 objects 0  Language- repeat 1  Language- follow 3 step command 3  Language- read & follow direction 0  Write a sentence 0  Copy design 0  Total score 20     PLEASE NOTE: A Mini-Cog screen was completed. Maximum score is 20. A value of 0 denotes this part of Folstein MMSE was not completed or the patient failed this part of the Mini-Cog screening.   Mini-Cog Screening Orientation to Time - Max 5 pts Orientation to Place - Max 5 pts Registration - Max 3 pts Recall - Max 3 pts Language Repeat - Max 1 pts Language  Follow 3 Step Command - Max 3 pts     Screening Tests Health Maintenance  Topic Date Due  . TETANUS/TDAP  03/23/2026 (Originally 12/02/1964)  . PNA vac Low Risk Adult (1 of 2 - PCV13) 03/23/2026 (Originally 12/02/2012)  . COLONOSCOPY  03/06/2027 (Originally 12/02/1997)  . INFLUENZA VACCINE  06/24/2017  . Hepatitis C Screening  Completed        Plan:     I have personally reviewed and addressed the Medicare Annual Wellness questionnaire and have noted the following in the patient's chart:  A. Medical and social history B. Use of alcohol, tobacco or illicit drugs  C. Current medications and supplements D. Functional ability and status E.  Nutritional status F.  Physical activity G. Advance directives H. List of other physicians I.  Hospitalizations, surgeries, and ER visits in previous 12 months J.  Vitals K. Screenings to include hearing, vision, cognitive, depression L. Referrals and appointments - none  In addition, I have reviewed and discussed with patient certain preventive protocols, quality metrics, and best practice recommendations. A written personalized care plan for preventive services as well as general preventive health recommendations were provided to patient.  See attached scanned questionnaire for additional information.   Signed,   Randa Evens, MHA, BS, LPN Health Coach

## 2017-03-06 ENCOUNTER — Other Ambulatory Visit: Payer: Self-pay | Admitting: Primary Care

## 2017-03-06 DIAGNOSIS — R739 Hyperglycemia, unspecified: Secondary | ICD-10-CM

## 2017-03-06 LAB — HEPATITIS C ANTIBODY: HCV AB: NEGATIVE

## 2017-03-11 ENCOUNTER — Ambulatory Visit (INDEPENDENT_AMBULATORY_CARE_PROVIDER_SITE_OTHER): Payer: Medicare Other | Admitting: Primary Care

## 2017-03-11 ENCOUNTER — Encounter: Payer: Self-pay | Admitting: Primary Care

## 2017-03-11 VITALS — BP 126/70 | HR 69 | Temp 98.0°F | Ht 70.0 in | Wt 321.1 lb

## 2017-03-11 DIAGNOSIS — I10 Essential (primary) hypertension: Secondary | ICD-10-CM

## 2017-03-11 DIAGNOSIS — R972 Elevated prostate specific antigen [PSA]: Secondary | ICD-10-CM

## 2017-03-11 DIAGNOSIS — R7303 Prediabetes: Secondary | ICD-10-CM | POA: Insufficient documentation

## 2017-03-11 DIAGNOSIS — R739 Hyperglycemia, unspecified: Secondary | ICD-10-CM

## 2017-03-11 DIAGNOSIS — E785 Hyperlipidemia, unspecified: Secondary | ICD-10-CM | POA: Diagnosis not present

## 2017-03-11 DIAGNOSIS — E1165 Type 2 diabetes mellitus with hyperglycemia: Secondary | ICD-10-CM | POA: Insufficient documentation

## 2017-03-11 DIAGNOSIS — E119 Type 2 diabetes mellitus without complications: Secondary | ICD-10-CM | POA: Insufficient documentation

## 2017-03-11 NOTE — Assessment & Plan Note (Signed)
Noted on recent labs, not fasting for these labs. Recheck lipids when he is fasting, he will return within the next week. Discussed the importance of a healthy diet and regular exercise in order for weight loss, and to reduce the risk of other medical diseases.

## 2017-03-11 NOTE — Progress Notes (Signed)
Pre visit review using our clinic review tool, if applicable. No additional management support is needed unless otherwise documented below in the visit note. 

## 2017-03-11 NOTE — Assessment & Plan Note (Signed)
Noted on recent labs, he was not fasting.  Check A1C today to ensure no diabetes as he is obese and at risk.

## 2017-03-11 NOTE — Progress Notes (Signed)
Subjective:    Patient ID: Adam Jennings, male    DOB: 08-08-1946, 71 y.o.   MRN: [id number]  HPI  Adam Jennings is a 71 year old male who presents today for MWV Part 2.  He completed his colonoscopy 8-9 years ago at [facility] which was clear. Prior to his labs last week he ate a large bowl of Special K cereal.  Overall feeling well, he has no complaints today.  Diet currently consists of:  Breakfast: Cereal  Lunch: [place] sandwich Dinner: Meat (fried), vegetable, starch Snacks: None Desserts: None Beverages: Water, soda, sweet tea with dinner, water at bedtime.   Exercise: He does not currently exercise.   1) Essential Hypertension: Currently managed on lisinopril 20 mg. His BP in the office today is 126/70. He denies chest pain, shortness of breath, dizziness. He has noticed a change in vision like "vasaline over my eye".   2) Elevated PSA: Recent PSA noted to be elevated at 7.05. He denies prior histoyr of elevated readings. He does experience nocturia every 1-2 hours most nights. He has no family history of prostate cancer. His PSA is 7.05. He does drink sweet tea at dinner and water before bed most nights.  Review of Systems  Constitutional: Negative for unexpected weight change.  HENT: Negative for rhinorrhea.   Respiratory: Negative for cough and shortness of breath.   Cardiovascular: Negative for chest pain.  Gastrointestinal: Negative for constipation and diarrhea.  Genitourinary: Negative for difficulty urinating.  Musculoskeletal: Negative for arthralgias and myalgias.  Skin: Negative for rash.  Allergic/Immunologic: Negative for environmental allergies.  Neurological: Negative for dizziness, numbness and headaches.  Psychiatric/Behavioral:       Denies concerns for anxiety or depression       Past Medical History:  Diagnosis Date  . Dizziness   . Elevated blood pressure      Social History   Social History  . Marital status: Married    Spouse  name: N/A  . Number of children: N/A  . Years of education: N/A   Occupational History  . Not on file.   Social History Main Topics  . Smoking status: Never Smoker  . Smokeless tobacco: Never Used  . Alcohol use No  . Drug use: No  . Sexual activity: Not on file   Other Topics Concern  . Not on file   Social History Narrative   Married.   1 daughter, 4 grandchildren.   Works in [employer] in [place].   Enjoys working on tractors.     No past surgical history on file.  Family History  Problem Relation Age of Onset  . Hypertension Father     No Known Allergies  Current Outpatient Prescriptions on File Prior to Visit  Medication Sig Dispense Refill  . diclofenac (VOLTAREN) 75 MG EC tablet TAKE 1 TABLET (75 MG TOTAL) BY MOUTH 2 (TWO) TIMES DAILY WITH A MEAL. 60 tablet 0  . lisinopril (PRINIVIL,ZESTRIL) 20 MG tablet TAKE 1 TABLET (20 MG TOTAL) BY MOUTH DAILY. 30 tablet 5  . Omega-3 Fatty Acids (FISH OIL) 1000 MG CAPS Take 1,000 mg by mouth daily.     No current facility-administered medications on file prior to visit.     BP 126/70   Pulse 69   Temp 98 F (36.7 C) (Oral)   Ht  (1.778 m)   Wt (!) 321 lb 1.9 oz (145.7 kg)   SpO2 98%   BMI 46.08 kg/m    Objective:  Physical Exam  Constitutional: He is oriented to person, place, and time. He appears well-nourished.  HENT:  Right Ear: Tympanic membrane and ear canal normal.  Left Ear: Tympanic membrane and ear canal normal.  Nose: Right sinus exhibits no maxillary sinus tenderness and no frontal sinus tenderness. Left sinus exhibits no maxillary sinus tenderness and no frontal sinus tenderness.  Mouth/Throat: Oropharynx is clear and moist.  Eyes: Conjunctivae are normal.  Neck: Neck supple. Carotid bruit is not present.  Cardiovascular: Normal rate, regular rhythm and normal heart sounds.   Pulmonary/Chest: Effort normal and breath sounds normal. He has no wheezes. He has no rales.  Abdominal: Soft. There is no  tenderness.  Umbilical hernia noted, non tender.  Neurological: He is alert and oriented to person, place, and time.  Skin: Skin is warm and dry.  Psychiatric: He has a normal mood and affect.          Assessment & Plan:

## 2017-03-11 NOTE — Assessment & Plan Note (Signed)
Stable today, continue lisinopril. BMP UTD.

## 2017-03-11 NOTE — Assessment & Plan Note (Signed)
Elevated at 7.05. Could very well be BHP, especially given symptoms of nocturia. Nocturia could be related to undiagnosed diabetes and/or caffeine/water consumption in the evening. A1C pending. Referral placed to Urology for further evaluation.

## 2017-03-11 NOTE — Patient Instructions (Addendum)
Schedule a lab only appointment and return fasting for labs.  You will be contacted regarding your referral to Urology.  Please let us know if you have not heard back within one week.   Limit intake of fluids at bedtime. Sweet tea is a diuretic which will cause you to urinate more than usual.  It's important to improve your diet by reducing consumption of fast food, fried food, processed snack foods, sugary drinks. Increase consumption of fresh vegetables and fruits, whole grains, water.  Ensure you are drinking 64 ounces of water daily.  Start exercising. You should be getting 150 minutes of moderate intensity exercise weekly.  It was a pleasure to see you today!

## 2017-03-12 ENCOUNTER — Other Ambulatory Visit (INDEPENDENT_AMBULATORY_CARE_PROVIDER_SITE_OTHER): Payer: Medicare Other

## 2017-03-12 DIAGNOSIS — E785 Hyperlipidemia, unspecified: Secondary | ICD-10-CM

## 2017-03-12 DIAGNOSIS — R739 Hyperglycemia, unspecified: Secondary | ICD-10-CM | POA: Diagnosis not present

## 2017-03-12 LAB — LIPID PANEL
CHOLESTEROL: 261 mg/dL — AB (ref 0–200)
HDL: 32.4 mg/dL — ABNORMAL LOW (ref >39.00)
LDL Cholesterol: 196 mg/dL — ABNORMAL HIGH (ref 0–99)
NonHDL: 228.3
TRIGLYCERIDES: 161 mg/dL — AB (ref 0.0–149.0)
Total CHOL/HDL Ratio: 8
VLDL: 32.2 mg/dL (ref 0.0–40.0)

## 2017-03-12 LAB — HEMOGLOBIN A1C: HEMOGLOBIN A1C: 6.2 % (ref 4.6–6.5)

## 2017-03-16 ENCOUNTER — Other Ambulatory Visit: Payer: Self-pay | Admitting: Primary Care

## 2017-03-16 DIAGNOSIS — E785 Hyperlipidemia, unspecified: Secondary | ICD-10-CM

## 2017-03-16 MED ORDER — ATORVASTATIN CALCIUM 20 MG PO TABS: 20.0000 mg | ORAL_TABLET | Freq: Every evening | ORAL | 0 refills | Status: DC

## 2017-03-24 ENCOUNTER — Telehealth: Payer: Self-pay | Admitting: Primary Care

## 2017-03-24 NOTE — Telephone Encounter (Signed)
Do we have any handouts containing dye information for hyperlipidemia and prediabetes? I know we can prevent them in Epic onto the patient's AVS, not sure if we can print this out through a phone note.

## 2017-03-24 NOTE — Telephone Encounter (Signed)
Pt spouse called regarding a diet list that was supposed to be sent out after last labs. Patty said she spoke with Selena Batten concerning this and hasn't received it yet. She prefers to pick it up in the office- please call when ready.

## 2017-03-24 NOTE — Telephone Encounter (Signed)
Placed in Chan's inbox, ready for patient pick up.

## 2017-03-24 NOTE — Telephone Encounter (Signed)
Spoken to patient's wife and left the handout in the front office for pick up.

## 2017-03-24 NOTE — Telephone Encounter (Signed)
Diet info for HLD given to [facility].

## 2017-06-16 ENCOUNTER — Other Ambulatory Visit: Payer: Self-pay | Admitting: Primary Care

## 2017-06-16 DIAGNOSIS — I10 Essential (primary) hypertension: Secondary | ICD-10-CM

## 2017-06-24 DIAGNOSIS — R972 Elevated prostate specific antigen [PSA]: Secondary | ICD-10-CM | POA: Diagnosis not present

## 2017-06-24 DIAGNOSIS — R351 Nocturia: Secondary | ICD-10-CM | POA: Diagnosis not present

## 2017-06-26 ENCOUNTER — Ambulatory Visit: Payer: Medicare Other | Admitting: Primary Care

## 2017-06-26 ENCOUNTER — Telehealth: Payer: Self-pay | Admitting: Primary Care

## 2017-06-26 NOTE — Telephone Encounter (Signed)
No fee. We will see him at his rescheduled appointment time.

## 2017-06-26 NOTE — Telephone Encounter (Signed)
Patient did not come in for their appointment today for follow up. Please let me know if patient needs to be contacted immediately for follow up or no follow up needed. Do you want to charge the NSF?  Pt came in at 230, his wife told him his appointment was at 3 pm.

## 2017-06-30 ENCOUNTER — Encounter: Payer: Self-pay | Admitting: Primary Care

## 2017-06-30 ENCOUNTER — Ambulatory Visit (INDEPENDENT_AMBULATORY_CARE_PROVIDER_SITE_OTHER): Payer: Medicare Other | Admitting: Primary Care

## 2017-06-30 VITALS — BP 114/70 | HR 67 | Temp 97.9°F | Ht 70.25 in | Wt 312.6 lb

## 2017-06-30 DIAGNOSIS — E785 Hyperlipidemia, unspecified: Secondary | ICD-10-CM | POA: Diagnosis not present

## 2017-06-30 DIAGNOSIS — H538 Other visual disturbances: Secondary | ICD-10-CM | POA: Insufficient documentation

## 2017-06-30 DIAGNOSIS — I10 Essential (primary) hypertension: Secondary | ICD-10-CM

## 2017-06-30 DIAGNOSIS — R7303 Prediabetes: Secondary | ICD-10-CM | POA: Diagnosis not present

## 2017-06-30 HISTORY — DX: Other visual disturbances: H53.8

## 2017-06-30 NOTE — Assessment & Plan Note (Signed)
Stable in the office today. Continue lisinopril 20 mg.

## 2017-06-30 NOTE — Patient Instructions (Signed)
Stop by the front desk and speak with either Shirlee Limerick or Zella Ball regarding your referral to the eye doctor.   Complete lab work prior to leaving today. I will notify you of your results once received.   Continue to work on improvements in diet and exercise. Increase vegetables, fruit, whole grains, lean protein, water.  It was a pleasure to see you today!   High Cholesterol High cholesterol is a condition in which the blood has high levels of a white, waxy, fat-like substance (cholesterol). The human body needs small amounts of cholesterol. The liver makes all the cholesterol that the body needs. Extra (excess) cholesterol comes from the food that we eat. Cholesterol is carried from the liver by the blood through the blood vessels. If you have high cholesterol, deposits (plaques) may build up on the walls of your blood vessels (arteries). Plaques make the arteries narrower and stiffer. Cholesterol plaques increase your risk for heart attack and stroke. Work with your health care provider to keep your cholesterol levels in a healthy range. What increases the risk? This condition is more likely to develop in people who:  Eat foods that are high in animal fat (saturated fat) or cholesterol.  Are overweight.  Are not getting enough exercise.  Have a family history of high cholesterol.  What are the signs or symptoms? There are no symptoms of this condition. How is this diagnosed? This condition may be diagnosed from the results of a blood test.  If you are older than age 93, your health care provider may check your cholesterol every 4-6 years.  You may be checked more often if you already have high cholesterol or other risk factors for heart disease.  The blood test for cholesterol measures:  "Bad" cholesterol (LDL cholesterol). This is the main type of cholesterol that causes heart disease. The desired level for LDL is less than 100.  "Good" cholesterol (HDL cholesterol). This type helps  to protect against heart disease by cleaning the arteries and carrying the LDL away. The desired level for HDL is 60 or higher.  Triglycerides. These are fats that the body can store or burn for energy. The desired number for triglycerides is lower than 150.  Total cholesterol. This is a measure of the total amount of cholesterol in your blood, including LDL cholesterol, HDL cholesterol, and triglycerides. A healthy number is less than 200.  How is this treated? This condition is treated with diet changes, lifestyle changes, and medicines. Diet changes  This may include eating more whole grains, fruits, vegetables, nuts, and fish.  This may also include cutting back on red meat and foods that have a lot of added sugar. Lifestyle changes  Changes may include getting at least 40 minutes of aerobic exercise 3 times a week. Aerobic exercises include walking, biking, and swimming. Aerobic exercise along with a healthy diet can help you maintain a healthy weight.  Changes may also include quitting smoking. Medicines  Medicines are usually given if diet and lifestyle changes have failed to reduce your cholesterol to healthy levels.  Your health care provider may prescribe a statin medicine. Statin medicines have been shown to reduce cholesterol, which can reduce the risk of heart disease. Follow these instructions at home: Eating and drinking  If told by your health care provider:  Eat chicken (without skin), fish, veal, shellfish, ground [place] breast, and round or loin cuts of red meat.  Do not eat fried foods or fatty meats, such as hot dogs and  salami.  Eat plenty of fruits, such as apples.  Eat plenty of vegetables, such as broccoli, potatoes, and carrots.  Eat beans, peas, and lentils.  Eat grains such as barley, rice, couscous, and bulgur wheat.  Eat pasta without cream sauces.  Use skim or nonfat milk, and eat low-fat or nonfat yogurt and cheeses.  Do not eat or drink  whole milk, cream, ice cream, egg yolks, or hard cheeses.  Do not eat stick margarine or tub margarines that contain trans fats (also called partially hydrogenated oils).  Do not eat saturated tropical oils, such as coconut oil and palm oil.  Do not eat cakes, cookies, crackers, or other baked goods that contain trans fats.  General instructions  Exercise as directed by your health care provider. Increase your activity level with activities such as gardening, walking, and taking the stairs.  Take over-the-counter and prescription medicines only as told by your health care provider.  Do not use any products that contain nicotine or tobacco, such as cigarettes and e-cigarettes. If you need help quitting, ask your health care provider.  Keep all follow-up visits as told by your health care provider. This is important. Contact a health care provider if:  You are struggling to maintain a healthy diet or weight.  You need help to start on an exercise program.  You need help to stop smoking. Get help right away if:  You have chest pain.  You have trouble breathing. This information is not intended to replace advice given to you by your health care provider. Make sure you discuss any questions you have with your health care provider. Document Released: 11/10/2005 Document Revised: 06/07/2016 Document Reviewed: 05/10/2016 Elsevier Interactive Patient Education  2017 [employer]

## 2017-06-30 NOTE — Assessment & Plan Note (Signed)
A1C of 6.2 on prior labs. Weight loss of 11 pounds since last visit. Will repeat A1C today. Referral placed to optometry for eye exam.

## 2017-06-30 NOTE — Assessment & Plan Note (Signed)
Noted to right eye for 2-3 months. Visual acuity abnormal. No alarm signs for acute glaucoma, but do recommend optometry evaluation. Could be cataracts. No obvious foreign body.

## 2017-06-30 NOTE — Progress Notes (Signed)
Subjective:    Patient ID: Adam Jennings, male    DOB: 03-19-1946, 71 y.o.   MRN: [id number]  HPI  Adam Jennings is a 71 year old male is a 71 year old male who presents today for follow up.  1) Hyperlipidemia: Currently managed on atorvastatin 20 mg. Cholesterol in April 2018 above goal with TC of 261, LDL of 196, Trigs of 161. During that time he was placed on atorvastatin 20 mg. He has been compliant to his atorvastatin daily, ran out two days ago. He is fasting today.  The 10-year ASCVD risk score [name], et al., 2013) is: 22.5%   Values used to calculate the score:     Age: 19 years     Sex: Male     Is Non-Hispanic African American: No     Diabetic: No     Tobacco smoker: No     Systolic Blood Pressure: 114 mmHg     Is BP treated: Yes     HDL Cholesterol: 32.4 mg/dL     Total Cholesterol: 261 mg/dL   2) Prediabetes: N8G of 6.2 on labs in April 2018. He hasn't had a recent eye exam. He has noticed a cloudy visual sensation to his right inner eye. This began several months ago. Denies headaches, pressure, loss of peripheral vision, trauma, foreign body.   Diet currently consists of:  Breakfast: Cereal Lunch: Sandwich, granola bar Dinner: Pasta, hot dogs, steak, potatoes, beans Snacks: Fruit Desserts: Occasional Jello Beverages: Water mostly, Gatorade, little soda  Exercise: He is active but is not exercising.   Wt Readings from Last 3 Encounters:  06/30/17 (!) 312 lb 9.6 oz (141.8 kg)  03/11/17 (!) 321 lb 1.9 oz (145.7 kg)  03/05/17 (!) 322 lb 8 oz (146.3 kg)      Review of Systems  HENT: Negative for congestion.   Eyes: Positive for visual disturbance. Negative for discharge and redness.  Respiratory: Negative for shortness of breath.   Cardiovascular: Negative for chest pain.  Neurological: Negative for headaches.       Past Medical History:  Diagnosis Date  . Dizziness   . Elevated blood pressure      Social History   Social History  .  Marital status: Married    Spouse name: N/A  . Number of children: N/A  . Years of education: N/A   Occupational History  . Not on file.   Social History Main Topics  . Smoking status: Never Smoker  . Smokeless tobacco: Never Used  . Alcohol use No  . Drug use: No  . Sexual activity: Not on file   Other Topics Concern  . Not on file   Social History Narrative   Married.   1 daughter, 4 grandchildren.   Works in [employer] in [place].   Enjoys working on tractors.     No past surgical history on file.  Family History  Problem Relation Age of Onset  . Hypertension Father     No Known Allergies  Current Outpatient Prescriptions on File Prior to Visit  Medication Sig Dispense Refill  . lisinopril (PRINIVIL,ZESTRIL) 20 MG tablet TAKE 1 TABLET (20 MG TOTAL) BY MOUTH DAILY. 30 tablet 5  . Omega-3 Fatty Acids (FISH OIL) 1000 MG CAPS Take 1,000 mg by mouth daily.    Marland Kitchen atorvastatin (LIPITOR) 20 MG tablet Take 1 tablet (20 mg total) by mouth every evening. (Patient not taking: Reported on 06/30/2017) 90 tablet 0   No current  facility-administered medications on file prior to visit.     BP 114/70   Pulse 67   Temp 97.9 F (36.6 C) (Oral)   Ht 5' 10.25" (1.784 m)   Wt (!) 312 lb 9.6 oz (141.8 kg)   SpO2 94%   BMI 44.53 kg/m    Objective:   Physical Exam  Constitutional: He appears well-nourished.  Eyes: Pupils are equal, round, and reactive to light. Conjunctivae and lids are normal. Right eye exhibits normal extraocular motion and no nystagmus. Left eye exhibits normal extraocular motion and no nystagmus.  No foreign body or cloudy film noted to eyes bilaterally   Neck: Neck supple.  Cardiovascular: Normal rate and regular rhythm.   Pulmonary/Chest: Effort normal and breath sounds normal.  Skin: Skin is warm and dry.          Assessment & Plan:

## 2017-06-30 NOTE — Assessment & Plan Note (Signed)
Weight loss of 11 pounds since last visit, through improvements in diet. Commended him on this success. Check lipids and LFT's today. Continue atorvastatin 20 mg.  The 10-year ASCVD risk score [name], et al., 2013) is: 22.5%   Values used to calculate the score:     Age: [age] years     Sex: Male     Is Non-Hispanic African American: No     Diabetic: No     Tobacco smoker: No     Systolic Blood Pressure: 114 mmHg     Is BP treated: Yes     HDL Cholesterol: 32.4 mg/dL     Total Cholesterol: 261 mg/dL

## 2017-07-01 ENCOUNTER — Other Ambulatory Visit: Payer: Self-pay | Admitting: Primary Care

## 2017-07-01 DIAGNOSIS — E785 Hyperlipidemia, unspecified: Secondary | ICD-10-CM

## 2017-07-01 LAB — HEPATIC FUNCTION PANEL
ALBUMIN: 4.6 g/dL (ref 3.5–5.2)
ALK PHOS: 45 U/L (ref 39–117)
ALT: 25 U/L (ref 0–53)
AST: 26 U/L (ref 0–37)
Bilirubin, Direct: 0.1 mg/dL (ref 0.0–0.3)
TOTAL PROTEIN: 7.7 g/dL (ref 6.0–8.3)
Total Bilirubin: 0.7 mg/dL (ref 0.2–1.2)

## 2017-07-01 LAB — LIPID PANEL
CHOLESTEROL: 212 mg/dL — AB (ref 0–200)
HDL: 37.3 mg/dL — AB (ref >39.00)
LDL Cholesterol: 151 mg/dL — ABNORMAL HIGH (ref 0–99)
NONHDL: 174.99
Total CHOL/HDL Ratio: 6
Triglycerides: 120 mg/dL (ref 0.0–149.0)
VLDL: 24 mg/dL (ref 0.0–40.0)

## 2017-07-01 LAB — HEMOGLOBIN A1C: Hgb A1c MFr Bld: 6 % (ref 4.6–6.5)

## 2017-08-19 DIAGNOSIS — R972 Elevated prostate specific antigen [PSA]: Secondary | ICD-10-CM | POA: Diagnosis not present

## 2017-08-19 DIAGNOSIS — N4289 Other specified disorders of prostate: Secondary | ICD-10-CM | POA: Diagnosis not present

## 2017-08-25 DIAGNOSIS — R351 Nocturia: Secondary | ICD-10-CM | POA: Diagnosis not present

## 2017-08-25 DIAGNOSIS — R972 Elevated prostate specific antigen [PSA]: Secondary | ICD-10-CM | POA: Diagnosis not present

## 2017-09-03 DIAGNOSIS — M25562 Pain in left knee: Secondary | ICD-10-CM | POA: Diagnosis not present

## 2017-10-13 ENCOUNTER — Other Ambulatory Visit: Payer: Self-pay | Admitting: Primary Care

## 2017-10-13 DIAGNOSIS — H2513 Age-related nuclear cataract, bilateral: Secondary | ICD-10-CM | POA: Diagnosis not present

## 2017-10-13 DIAGNOSIS — H25043 Posterior subcapsular polar age-related cataract, bilateral: Secondary | ICD-10-CM | POA: Diagnosis not present

## 2017-10-13 DIAGNOSIS — H25013 Cortical age-related cataract, bilateral: Secondary | ICD-10-CM | POA: Diagnosis not present

## 2017-10-13 DIAGNOSIS — H5213 Myopia, bilateral: Secondary | ICD-10-CM | POA: Diagnosis not present

## 2017-10-13 DIAGNOSIS — I10 Essential (primary) hypertension: Secondary | ICD-10-CM

## 2017-10-13 MED ORDER — LISINOPRIL 20 MG PO TABS: 20.0000 mg | ORAL_TABLET | Freq: Every day | ORAL | 0 refills | Status: DC

## 2017-10-19 LAB — HM DIABETES EYE EXAM

## 2017-10-27 ENCOUNTER — Encounter: Payer: Self-pay | Admitting: Primary Care

## 2017-11-05 DIAGNOSIS — H25011 Cortical age-related cataract, right eye: Secondary | ICD-10-CM | POA: Diagnosis not present

## 2017-11-05 DIAGNOSIS — H25041 Posterior subcapsular polar age-related cataract, right eye: Secondary | ICD-10-CM | POA: Diagnosis not present

## 2017-11-05 DIAGNOSIS — H2511 Age-related nuclear cataract, right eye: Secondary | ICD-10-CM | POA: Diagnosis not present

## 2017-11-05 DIAGNOSIS — H25811 Combined forms of age-related cataract, right eye: Secondary | ICD-10-CM | POA: Diagnosis not present

## 2017-11-05 HISTORY — PX: CATARACT EXTRACTION W/ INTRAOCULAR LENS IMPLANT: SHX1309

## 2017-11-25 DIAGNOSIS — M25561 Pain in right knee: Secondary | ICD-10-CM | POA: Diagnosis not present

## 2017-12-08 HISTORY — PX: CATARACT EXTRACTION W/ INTRAOCULAR LENS IMPLANT: SHX1309

## 2017-12-17 DIAGNOSIS — H25012 Cortical age-related cataract, left eye: Secondary | ICD-10-CM | POA: Diagnosis not present

## 2017-12-17 DIAGNOSIS — H25042 Posterior subcapsular polar age-related cataract, left eye: Secondary | ICD-10-CM | POA: Diagnosis not present

## 2017-12-17 DIAGNOSIS — H25812 Combined forms of age-related cataract, left eye: Secondary | ICD-10-CM | POA: Diagnosis not present

## 2017-12-17 DIAGNOSIS — H2512 Age-related nuclear cataract, left eye: Secondary | ICD-10-CM | POA: Diagnosis not present

## 2017-12-31 ENCOUNTER — Telehealth: Payer: Self-pay | Admitting: Primary Care

## 2017-12-31 DIAGNOSIS — M25561 Pain in right knee: Secondary | ICD-10-CM | POA: Diagnosis not present

## 2017-12-31 NOTE — Telephone Encounter (Signed)
Okay to take the meloxicam, it won't interfere with other medications. Kidney function normal. I added this to his med list.

## 2017-12-31 NOTE — Telephone Encounter (Signed)
Placed in Kate's inbox. 

## 2017-12-31 NOTE — Telephone Encounter (Signed)
Pt has been prescribed a pain medication from an orthopedic office and would like to make sure it will not interact/conflict with any of his current meds. Placed in Rx tower.

## 2018-01-01 NOTE — Telephone Encounter (Signed)
Per DPR, left detail message of Kate's comments for patient. 

## 2018-01-04 ENCOUNTER — Other Ambulatory Visit: Payer: Self-pay | Admitting: Primary Care

## 2018-01-04 DIAGNOSIS — E785 Hyperlipidemia, unspecified: Secondary | ICD-10-CM

## 2018-01-04 NOTE — Telephone Encounter (Signed)
Per DPR, left detail message of Kate's comments for patient. 

## 2018-01-14 ENCOUNTER — Other Ambulatory Visit: Payer: Self-pay | Admitting: Primary Care

## 2018-01-14 DIAGNOSIS — I10 Essential (primary) hypertension: Secondary | ICD-10-CM

## 2018-03-06 ENCOUNTER — Other Ambulatory Visit: Payer: Self-pay | Admitting: Primary Care

## 2018-03-06 DIAGNOSIS — R7303 Prediabetes: Secondary | ICD-10-CM

## 2018-03-06 DIAGNOSIS — E782 Mixed hyperlipidemia: Secondary | ICD-10-CM

## 2018-03-06 DIAGNOSIS — I10 Essential (primary) hypertension: Secondary | ICD-10-CM

## 2018-03-08 ENCOUNTER — Ambulatory Visit (INDEPENDENT_AMBULATORY_CARE_PROVIDER_SITE_OTHER): Payer: Medicare Other

## 2018-03-08 ENCOUNTER — Ambulatory Visit: Payer: Medicare Other

## 2018-03-08 VITALS — BP 122/80 | HR 61 | Temp 98.0°F | Ht 70.0 in | Wt 317.8 lb

## 2018-03-08 DIAGNOSIS — I10 Essential (primary) hypertension: Secondary | ICD-10-CM | POA: Diagnosis not present

## 2018-03-08 DIAGNOSIS — E782 Mixed hyperlipidemia: Secondary | ICD-10-CM | POA: Diagnosis not present

## 2018-03-08 DIAGNOSIS — Z0000 Encounter for general adult medical examination without abnormal findings: Secondary | ICD-10-CM

## 2018-03-08 DIAGNOSIS — R7303 Prediabetes: Secondary | ICD-10-CM

## 2018-03-08 LAB — COMPREHENSIVE METABOLIC PANEL
ALK PHOS: 47 U/L (ref 39–117)
ALT: 20 U/L (ref 0–53)
AST: 18 U/L (ref 0–37)
Albumin: 4.4 g/dL (ref 3.5–5.2)
BUN: 18 mg/dL (ref 6–23)
CHLORIDE: 104 meq/L (ref 96–112)
CO2: 29 meq/L (ref 19–32)
Calcium: 9.9 mg/dL (ref 8.4–10.5)
Creatinine, Ser: 0.91 mg/dL (ref 0.40–1.50)
GFR: 87.48 mL/min (ref >60.00)
GLUCOSE: 129 mg/dL — AB (ref 70–99)
POTASSIUM: 4.2 meq/L (ref 3.5–5.1)
SODIUM: 140 meq/L (ref 135–145)
TOTAL PROTEIN: 7.3 g/dL (ref 6.0–8.3)
Total Bilirubin: 0.7 mg/dL (ref 0.2–1.2)

## 2018-03-08 LAB — LIPID PANEL
CHOL/HDL RATIO: 6
Cholesterol: 183 mg/dL (ref 0–200)
HDL: 32.9 mg/dL — AB (ref >39.00)
LDL CALC: 125 mg/dL — AB (ref 0–99)
NONHDL: 150.28
Triglycerides: 128 mg/dL (ref 0.0–149.0)
VLDL: 25.6 mg/dL (ref 0.0–40.0)

## 2018-03-08 LAB — HEMOGLOBIN A1C: HEMOGLOBIN A1C: 6.2 % (ref 4.6–6.5)

## 2018-03-08 NOTE — Progress Notes (Signed)
PCP notes:   Health maintenance:  No gaps identified that have not been previously addressed.   Abnormal screenings:   Hearing - failed  Hearing Screening   125Hz  250Hz  500Hz  1000Hz  2000Hz  3000Hz  4000Hz  6000Hz  8000Hz   Right ear:   40 40 40  40    Left ear:   40 40 40  0     Mini-Cog score: 19/20 MMSE - Mini Mental State Exam 03/08/2018 03/05/2017  Orientation to time 5 5  Orientation to Place 5 5  Registration 3 3  Attention/ Calculation 0 0  Recall 2 3  Recall-comments unable to recall 1 of 3 words -  Language- name 2 objects 0 0  Language- repeat 1 1  Language- follow 3 step command 3 3  Language- read & follow direction 0 0  Write a sentence 0 0  Copy design 0 0  Total score 19 20    Patient concerns:   Patient has a 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 Patient feels these wounds are caused by fiberglass.Patient has not consulted with a dermatologist.   Nurse concerns:  None  Next PCP appt:   03/15/18 @ 0800

## 2018-03-08 NOTE — Patient Instructions (Signed)
Adam Jennings , Thank you for taking time to come for your Medicare Wellness Visit. I appreciate your ongoing commitment to your health goals. Please review the following plan we discussed and let me know if I can assist you in the future.   These are the goals we discussed: Goals    . DIET - INCREASE WATER INTAKE     Starting 03/08/2018, I will continue to drink at least 6-8 glasses of water daily.        This is a list of the screening recommended for you and due dates:  Health Maintenance  Topic Date Due  . Tetanus Vaccine  03/23/2026*  . Pneumonia vaccines (1 of 2 - PCV13) 03/23/2026*  . Colon Cancer Screening  03/06/2027*  . Flu Shot  06/24/2018  .  Hepatitis C: One time screening is recommended by Center for Disease Control  (CDC) for  adults born from [birth year] through 1963.   Completed  *Topic was postponed. The date shown is not the original due date.   Preventive Care for Adults  A healthy lifestyle and preventive care can promote health and wellness. Preventive health guidelines for adults include the following key practices.  . A routine yearly physical is a good way to check with your health care provider about your health and preventive screening. It is a chance to share any concerns and updates on your health and to receive a thorough exam.  . Visit your dentist for a routine exam and preventive care every 6 months. Brush your teeth twice a day and floss once a day. Good oral hygiene prevents tooth decay and gum disease.  . The frequency of eye exams is based on your age, health, family medical history, use  of contact lenses, and other factors. Follow your health care provider's recommendations for frequency of eye exams.  . Eat a healthy diet. Foods like vegetables, fruits, whole grains, low-fat dairy products, and lean protein foods contain the nutrients you need without too many calories. Decrease your intake of foods high in solid fats, added sugars, and salt. Eat the right  amount of calories for you. Get information about a proper diet from your health care provider, if necessary.  . Regular physical exercise is one of the most important things you can do for your health. Most adults should get at least 150 minutes of moderate-intensity exercise (any activity that increases your heart rate and causes you to sweat) each week. In addition, most adults need muscle-strengthening exercises on 2 or more days a week.  Silver Sneakers may be a benefit available to you. To determine eligibility, you may visit the website: www.silversneakers.com or contact program at [phone] Mon-Fri between 8AM-8PM.   . Maintain a healthy weight. The body mass index (BMI) is a screening tool to identify possible weight problems. It provides an estimate of body fat based on height and weight. Your health care provider can find your BMI and can help you achieve or maintain a healthy weight.   For adults 20 years and older: ? A BMI below 18.5 is considered underweight. ? A BMI of 18.5 to 24.9 is normal. ? A BMI of 25 to 29.9 is considered overweight. ? A BMI of 30 and above is considered obese.   . Maintain normal blood lipids and cholesterol levels by exercising and minimizing your intake of saturated fat. Eat a balanced diet with plenty of fruit and vegetables. Blood tests for lipids and cholesterol should begin at age 70 and  be repeated every 5 years. If your lipid or cholesterol levels are high, you are over 50, or you are at high risk for heart disease, you may need your cholesterol levels checked more frequently. Ongoing high lipid and cholesterol levels should be treated with medicines if diet and exercise are not working.  . If you smoke, find out from your health care provider how to quit. If you do not use tobacco, please do not start.  . If you choose to drink alcohol, please do not consume more than 2 drinks per day. One drink is considered to be 12 ounces (355 mL) of beer, 5  ounces (148 mL) of wine, or 1.5 ounces (44 mL) of liquor.  . If you are 39-53 years old, ask your health care provider if you should take aspirin to prevent strokes.  . Use sunscreen. Apply sunscreen liberally and repeatedly throughout the day. You should seek shade when your shadow is shorter than you. Protect yourself by wearing long sleeves, pants, a wide-brimmed hat, and sunglasses year round, whenever you are outdoors.  . Once a month, do a whole body skin exam, using a mirror to look at the skin on your back. Tell your health care provider of new moles, moles that have irregular borders, moles that are larger than a pencil eraser, or moles that have changed in shape or color.

## 2018-03-08 NOTE — Progress Notes (Signed)
Subjective:   Adam Jennings is a 72 y.o. male who presents for Medicare Annual/Subsequent preventive examination.  Review of Systems:  N/A Cardiac Risk Factors include: advanced age (>14men, >7 women);male gender;obesity (BMI >30kg/m2);hypertension     Objective:    Vitals: BP 122/80 (BP Location: Right Arm, Patient Position: Sitting, Cuff Size: Large)   Pulse 61   Temp 98 F (36.7 C) (Oral)   Ht 5\' 10"  (1.778 m) Comment: no shoes  Wt (!) 317 lb 12 oz (144.1 kg)   SpO2 96%   BMI 45.59 kg/m   Body mass index is 45.59 kg/m.  Advanced Directives 03/08/2018 03/05/2017 02/12/2016  Does Patient Have a Medical Advance Directive? No Yes No  Type of Advance Directive - Healthcare Power of Attorney -  Copy of Healthcare Power of Attorney in Chart? - No - copy requested -  Would patient like information on creating a medical advance directive? Yes (MAU/Ambulatory/Procedural Areas - Information given) - No - patient declined information    Tobacco Social History   Tobacco Use  Smoking Status Never Smoker  Smokeless Tobacco Never Used     Counseling given: No   Clinical Intake:  Pre-visit preparation completed: Yes  Pain : No/denies pain Pain Score: 0-No pain     Nutritional Status: BMI > 30  Obese Nutritional Risks: Non-healing wound(both hands between 3rd and 4th digits) Diabetes: No  How often do you need to have someone help you when you read instructions, pamphlets, or other written materials from your doctor or pharmacy?: 1 - Never What is the last grade level you completed in school?: 9th grade  Interpreter Needed?: No  Comments: pt lives with spouse Information entered by :: LPinson, LPN  Past Medical History:  Diagnosis Date  . Dizziness   . Elevated blood pressure    Past Surgical History:  Procedure Laterality Date  . CATARACT EXTRACTION W/ INTRAOCULAR LENS IMPLANT Right 11/05/2017  . CATARACT EXTRACTION W/ INTRAOCULAR LENS IMPLANT Left 12/08/2017    Family History  Problem Relation Age of Onset  . Hypertension Father    Social History   Socioeconomic History  . Marital status: Married    Spouse name: Not on file  . Number of children: Not on file  . Years of education: Not on file  . Highest education level: Not on file  Occupational History  . Not on file  Social Needs  . Financial resource strain: Not on file  . Food insecurity:    Worry: Not on file    Inability: Not on file  . Transportation needs:    Medical: Not on file    Non-medical: Not on file  Tobacco Use  . Smoking status: Never Smoker  . Smokeless tobacco: Never Used  Substance and Sexual Activity  . Alcohol use: No    Alcohol/week: 0.0 oz  . Drug use: No  . Sexual activity: Not Currently  Lifestyle  . Physical activity:    Days per week: Not on file    Minutes per session: Not on file  . Stress: Not on file  Relationships  . Social connections:    Talks on phone: Not on file    Gets together: Not on file    Attends religious service: Not on file    Active member of club or organization: Not on file    Attends meetings of clubs or organizations: Not on file    Relationship status: Not on file  Other Topics Concern  .  Not on file  Social History Narrative   Married.   1 daughter, 4 grandchildren.   Works in [employer] in [place].   Enjoys working on tractors.     Outpatient Encounter Medications as of 03/08/2018  Medication Sig  . atorvastatin (LIPITOR) 20 MG tablet TAKE 1 TABLET (20 MG TOTAL) BY MOUTH EVERY EVENING.  Marland Kitchen lisinopril (PRINIVIL,ZESTRIL) 20 MG tablet TAKE 1 TABLET BY MOUTH EVERY DAY  . meloxicam (MOBIC) 15 MG tablet Take 15 mg by mouth daily.  . Omega-3 Fatty Acids (FISH OIL) 1000 MG CAPS Take 1,000 mg by mouth daily.   No facility-administered encounter medications on file as of 03/08/2018.     Activities of Daily Living In your present state of health, do you have any difficulty performing the following activities: 03/08/2018    Hearing? N  Vision? N  Difficulty concentrating or making decisions? N  Walking or climbing stairs? N  Dressing or bathing? N  Doing errands, shopping? N  Preparing Food and eating ? N  Using the Toilet? N  In the past six months, have you accidently leaked urine? Y  Do you have problems with loss of bowel control? N  Managing your Medications? N  Managing your Finances? N  Housekeeping or managing your Housekeeping? N  Some recent data might be hidden    Patient Care Team: Doreene Nest, NP as PCP - General (Internal Medicine)   Assessment:   This is a routine wellness examination for Adam Jennings.  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 DIET - INCREASE WATER INTAKE     Starting 03/08/2018, I will continue to drink at least 6-8 glasses of water daily.        Fall Risk Fall Risk  03/08/2018 03/05/2017  Falls in the past year? No No   Depression Screen PHQ 2/9 Scores 03/08/2018 03/05/2017  PHQ - 2 Score 0 0  PHQ- 9 Score 0 -    Cognitive Function MMSE - Mini Mental State Exam 03/08/2018 03/05/2017  Orientation to time 5 5  Orientation to Place 5 5  Registration 3 3  Attention/ Calculation 0 0  Recall 2 3  Recall-comments unable to recall 1 of 3 words -  Language- name 2 objects 0 0  Language- repeat 1 1  Language- follow 3 step command 3 3  Language- read & follow direction 0 0  Write a sentence 0 0  Copy design 0 0  Total score 19 20     PLEASE NOTE: A Mini-Cog screen was completed. Maximum score is 20. A value of 0 denotes this part of Folstein MMSE was not completed or the patient failed this part of the Mini-Cog screening.   Mini-Cog Screening Orientation to Time - Max 5 pts Orientation to Place - Max 5 pts Registration - Max 3 pts Recall  - Max 3 pts Language Repeat - Max 1 pts Language Follow 3 Step Command - Max 3 pts      There is no immunization history on file for this patient.  Screening Tests Health Maintenance  Topic Date Due  . TETANUS/TDAP  03/23/2026 (Originally 12/02/1966)  . PNA vac Low Risk Adult (1 of 2 - PCV13) 03/23/2026 (Originally 12/02/2012)  . COLONOSCOPY  03/06/2027 (Originally 12/02/1997)  . INFLUENZA VACCINE  06/24/2018  . Hepatitis C Screening  Completed       Plan:     I have personally reviewed, addressed, and noted the following in the patient's chart:  A. Medical and social history B. Use of alcohol, tobacco or illicit drugs  C. Current medications and supplements D. Functional ability and status E.  Nutritional status F.  Physical activity G. Advance directives H. List of other physicians I.  Hospitalizations, surgeries, and ER visits in previous 12 months J.  Vitals K. Screenings to include hearing, vision, cognitive, depression L. Referrals and appointments - none  In addition, I have reviewed and discussed with patient certain preventive protocols, quality metrics, and best practice recommendations. A written personalized care plan for preventive services as well as general preventive health recommendations were provided to patient.  See attached scanned questionnaire for additional information.   Signed,   Randa EvensLesia Donis Pinder, MHA, BS, LPN Health Coach

## 2018-03-13 NOTE — Progress Notes (Signed)
I reviewed health advisor's note, was available for consultation, and agree with documentation and plan.  

## 2018-03-15 ENCOUNTER — Ambulatory Visit (INDEPENDENT_AMBULATORY_CARE_PROVIDER_SITE_OTHER): Payer: Medicare Other | Admitting: Primary Care

## 2018-03-15 ENCOUNTER — Encounter: Payer: Self-pay | Admitting: Primary Care

## 2018-03-15 ENCOUNTER — Telehealth: Payer: Self-pay | Admitting: Primary Care

## 2018-03-15 DIAGNOSIS — K429 Umbilical hernia without obstruction or gangrene: Secondary | ICD-10-CM

## 2018-03-15 DIAGNOSIS — I10 Essential (primary) hypertension: Secondary | ICD-10-CM

## 2018-03-15 DIAGNOSIS — R7303 Prediabetes: Secondary | ICD-10-CM | POA: Diagnosis not present

## 2018-03-15 DIAGNOSIS — G8929 Other chronic pain: Secondary | ICD-10-CM | POA: Diagnosis not present

## 2018-03-15 DIAGNOSIS — M25561 Pain in right knee: Secondary | ICD-10-CM

## 2018-03-15 DIAGNOSIS — E785 Hyperlipidemia, unspecified: Secondary | ICD-10-CM | POA: Diagnosis not present

## 2018-03-15 DIAGNOSIS — R238 Other skin changes: Secondary | ICD-10-CM

## 2018-03-15 MED ORDER — ATORVASTATIN CALCIUM 40 MG PO TABS: ORAL_TABLET | ORAL | 3 refills | Status: DC

## 2018-03-15 NOTE — Assessment & Plan Note (Signed)
Recent increase in A1C to 6.2. Strongly advised him to work on weight loss. Continue to work on improvement in diet. Will repeat A1C in 6 months.

## 2018-03-15 NOTE — Assessment & Plan Note (Signed)
Chronic for years, getting worse over time. He is ready for surgical consult, referral placed.

## 2018-03-15 NOTE — Assessment & Plan Note (Signed)
Stable in the office today. Continue lisinopril 20 mg. 

## 2018-03-15 NOTE — Telephone Encounter (Signed)
Copied from CRM [id number]. Topic: Quick Communication - See Telephone Encounter >> Mar 15, 2018 10:26 AM Landry MellowFoltz, Melissa J wrote: CRM for notification. See Telephone encounter for: 03/15/18. Wife called - she said that she was to call you with the name of the cream that she is using.  The name is Clobetasol.  Cb is [phone]

## 2018-03-15 NOTE — Assessment & Plan Note (Signed)
Following with orthopedics, receiving injections. Will undergo surgical intervention at some point.

## 2018-03-15 NOTE — Patient Instructions (Addendum)
You will be contacted regarding your referral to General Surgery.  Please let us know if you have not been contacted within one week.   Start exercising. You should be getting 150 minutes of moderate intensity exercise weekly.  We've increased your atorvastatin to 40 mg. You may take two of the 20 mg tablets until your bottle is empty.  Please call me with the name of the cream you're using to your fingers. Wear gloves and/or band-aids to prevent skin breakdown.  Increase consumption of vegetables, fruit, whole grains, lean protein. Ensure you are consuming 64 ounces of water daily.  Schedule a lab only appointment in 6 weeks to repeat cholesterol.   Please schedule a follow up appointment in 6 months for pre-diabetes check.   It was a pleasure to see you today!

## 2018-03-15 NOTE — Progress Notes (Signed)
Subjective:    Patient ID: Adam Jennings, male    DOB: 09/26/1946, 72 y.o.   MRN: [id number]  HPI  Adam Jennings is a 72 year old male who presents today for MWV Part 2. He saw our health advisor last week.  1) Hyperlipidemia: Currently managed on atorvastatin 20 mg. Recent lipid panel with TC of 183, LDL of 125, Trigs 128.   The 10-year ASCVD risk score Adam Jennings(Goff DC Montez HagemanJr., et al., 2013) is: 21.5%   Values used to calculate the score:     Age: 89 years     Sex: Male     Is Non-Hispanic African American: No     Diabetic: No     Tobacco smoker: No     Systolic Blood Pressure: 120 mmHg     Is BP treated: Yes     HDL Cholesterol: 32.9 mg/dL     Total Cholesterol: 183 mg/dL   2) Essential Hypertension: Currently managed on lisinopril 20 mg. He denies chest pain, dizziness, headaches.   BP Readings from Last 3 Encounters:  03/15/18 120/76  03/08/18 122/80  06/30/17 114/70     3) Prediabetes: Recent A1C of 6.2, increased from 6.0 in August 2018.  Diet currently consists of:  Breakfast: Cereal (Special K) Lunch: [place]ur[place] sandwich (home made), granola bar Dinner: Pasta, vegetables, meat (sometimes fried meat) Snacks: Occasionally  Desserts: Occasionally  Beverages: Water, sweet tea  Exercise: He does hand weights twice daily at home   4) Elevated PSA: Currently following with Urology, was notified that he had an enlarged prostate. He experiences nocturia every one hour during the night. He has an upcoming appointment.   5) Periumbilical Hernia: Present for years, worse with lifting objects, bending, straining. He is ready for surgical consult for repair. No history of hernia repair in the past.   6) Skin Irritation: Noted between 3rd and 4th digits bilaterally for years. Intermittent during warmer months, improved during cooler months.  He's been using a drop of his wife's prescription strength cream with improvement. He's not sure of the name of the cream. He does not wear gloves or  bandaids to prevent skin breakdown.   Review of Systems  Respiratory: Negative for shortness of breath.   Cardiovascular: Negative for chest pain.  Gastrointestinal:       Periumbilical hernia  Genitourinary:       Nocturia   Skin:       Skin irritation between fingers bilaterally  Neurological: Negative for dizziness and headaches.       Past Medical History:  Diagnosis Date  . Dizziness   . Elevated blood pressure      Social History   Socioeconomic History  . Marital status: Married    Spouse name: Not on file  . Number of children: Not on file  . Years of education: Not on file  . Highest education level: Not on file  Occupational History  . Not on file  Social Needs  . Financial resource strain: Not on file  . Food insecurity:    Worry: Not on file    Inability: Not on file  . Transportation needs:    Medical: Not on file    Non-medical: Not on file  Tobacco Use  . Smoking status: Never Smoker  . Smokeless tobacco: Never Used  Substance and Sexual Activity  . Alcohol use: No    Alcohol/week: 0.0 oz  . Drug use: No  . Sexual activity: Not Currently  Lifestyle  .  Physical activity:    Days per week: Not on file    Minutes per session: Not on file  . Stress: Not on file  Relationships  . Social connections:    Talks on phone: Not on file    Gets together: Not on file    Attends religious service: Not on file    Active member of club or organization: Not on file    Attends meetings of clubs or organizations: Not on file    Relationship status: Not on file  . Intimate partner violence:    Fear of current or ex partner: Not on file    Emotionally abused: Not on file    Physically abused: Not on file    Forced sexual activity: Not on file  Other Topics Concern  . Not on file  Social History Narrative   Married.   1 daughter, 4 grandchildren.   Works in [employer] in [place].   Enjoys working on tractors.     Past Surgical History:  Procedure  Laterality Date  . CATARACT EXTRACTION W/ INTRAOCULAR LENS IMPLANT Right 11/05/2017  . CATARACT EXTRACTION W/ INTRAOCULAR LENS IMPLANT Left 12/08/2017    Family History  Problem Relation Age of Onset  . Hypertension Father     No Known Allergies  Current Outpatient Medications on File Prior to Visit  Medication Sig Dispense Refill  . atorvastatin (LIPITOR) 20 MG tablet TAKE 1 TABLET (20 MG TOTAL) BY MOUTH EVERY EVENING. 90 tablet 1  . lisinopril (PRINIVIL,ZESTRIL) 20 MG tablet TAKE 1 TABLET BY MOUTH EVERY DAY 90 tablet 1  . meloxicam (MOBIC) 15 MG tablet Take 15 mg by mouth daily.    . Omega-3 Fatty Acids (FISH OIL) 1000 MG CAPS Take 1,000 mg by mouth daily.     No current facility-administered medications on file prior to visit.     BP 120/76   Pulse 61   Temp 98 F (36.7 C) (Oral)   Ht 5\' 10"  (1.778 m)   Wt (!) 322 lb 8 oz (146.3 kg)   SpO2 96%   BMI 46.27 kg/m    Objective:   Physical Exam  Constitutional: He appears well-nourished.  Neck: Neck supple. Carotid bruit is not present.  Cardiovascular: Normal rate and regular rhythm.  Pulmonary/Chest: Effort normal and breath sounds normal.  Abdominal: Soft. Bowel sounds are normal. There is no tenderness.    Moderately sized periumbilical hernia  Skin: Skin is warm and dry.  Minor skin breakdown with moisture in between 3rd and 4th digits bilaterally (base of webbing).           Assessment & Plan:

## 2018-03-15 NOTE — Assessment & Plan Note (Signed)
ASCVD risk score of 21%. Reminded him over several visits to work on weight loss through diet and exercise. Given recent LDL level and ASCVD risk score, will increase atorvastatin to 40 mg.   Repeat lipids and LFT's in 6 weeks.

## 2018-03-17 MED ORDER — TRIAMCINOLONE ACETONIDE 0.5 % EX OINT: 1.0000 "application " | TOPICAL_OINTMENT | Freq: Two times a day (BID) | CUTANEOUS | 0 refills | Status: DC

## 2018-03-17 NOTE — Telephone Encounter (Signed)
Spoken and notified patient of Kate Clark's comments. Patient verbalized understanding.  

## 2018-03-17 NOTE — Telephone Encounter (Signed)
Noted, Rx for triamcinolone ointment sent to pharmacy. Please have him make sure to wear gloves or band aids in between fingers to prevent friction and skin breakdown.

## 2018-03-22 ENCOUNTER — Encounter: Payer: Self-pay | Admitting: Primary Care

## 2018-03-25 IMAGING — DX DG KNEE COMPLETE 4+V*R*
5 series · 5 of 5 positions shown · non-contrast
Comparison: None.

CLINICAL DATA: Right knee pain since a fall in March 2016. Decreased
range of motion for the past 3-4 weeks.

EXAM:
RIGHT KNEE - COMPLETE 4+ VIEW

[knee ap]
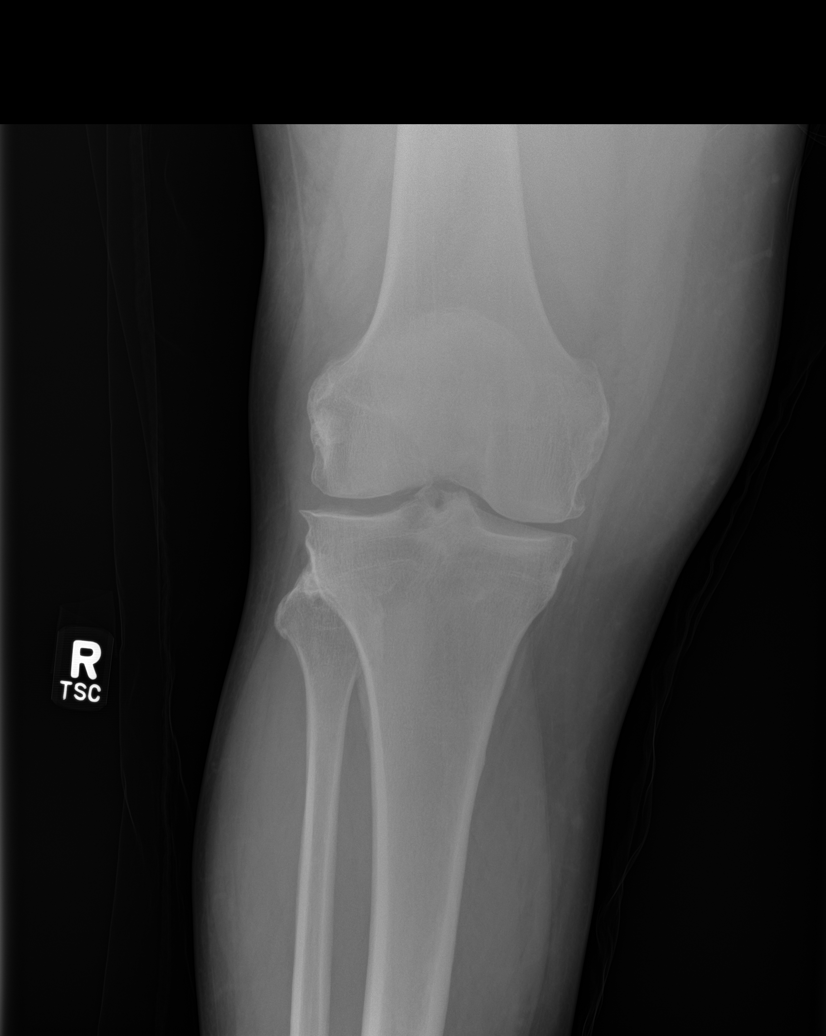

[knee lat]
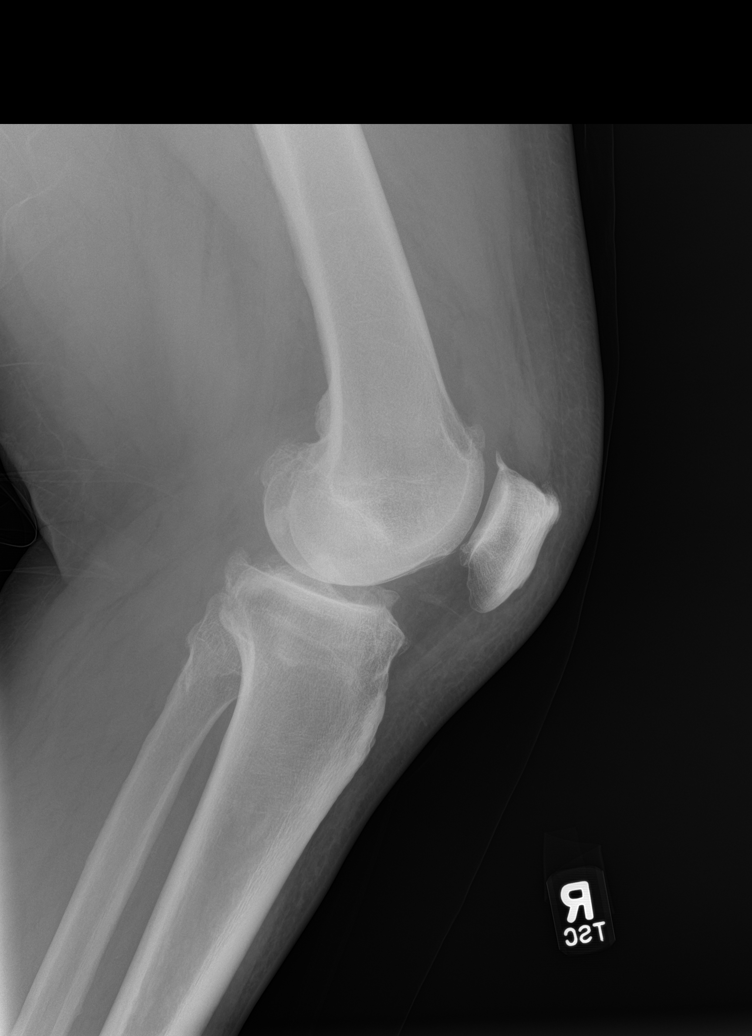

[patella skyline]
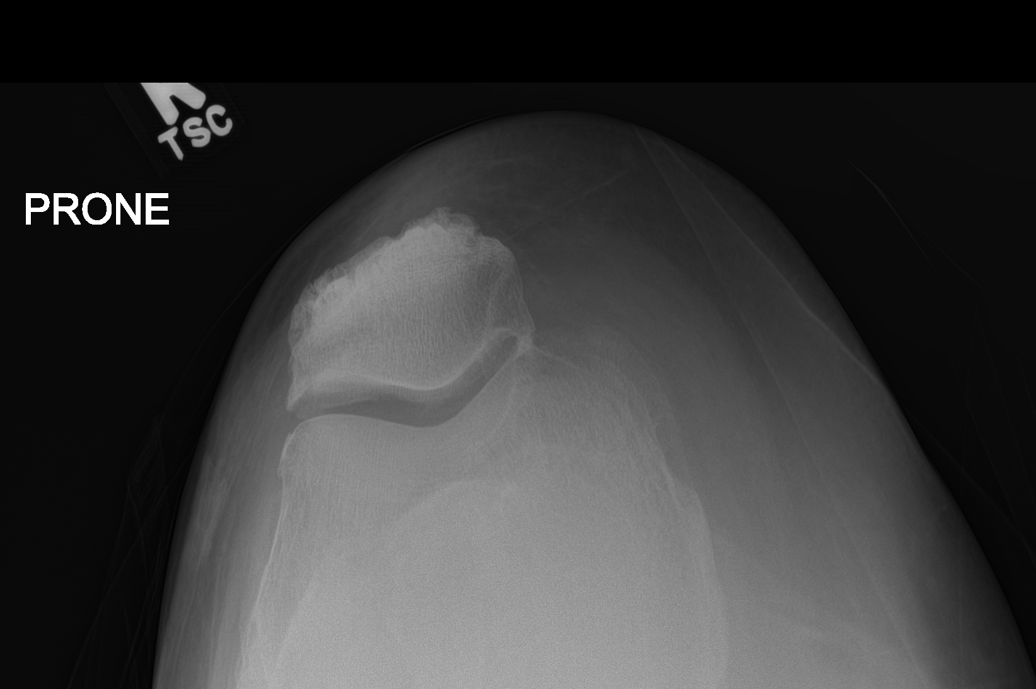

[knee obl (1 of 2)]
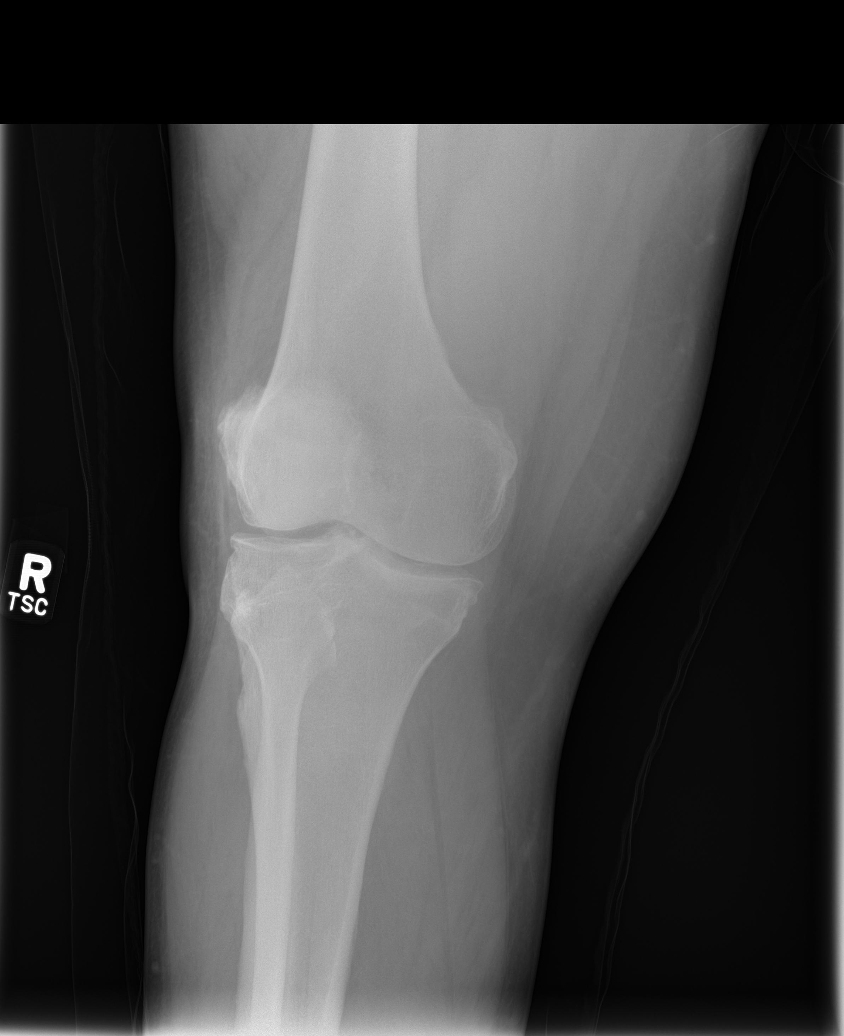

[knee obl (2 of 2)]
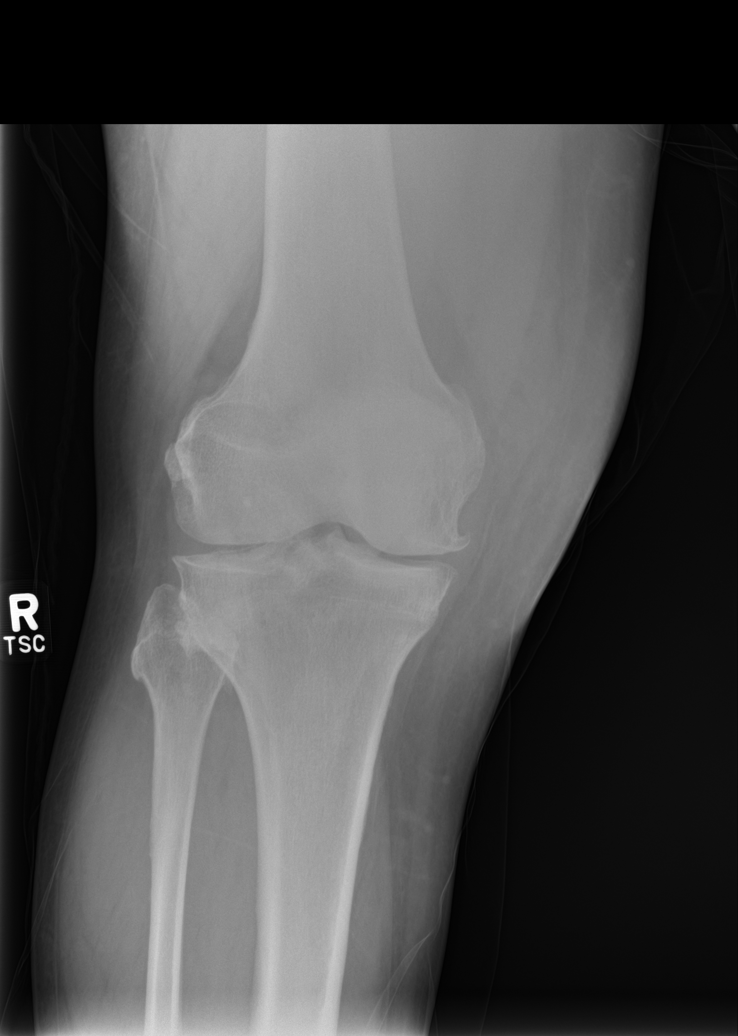

[5 of 5 positions shown; findings below may reference images not displayed]

FINDINGS: No fracture or focal bony lesion is identified. Small to moderate
joint effusion is seen. Moderate to moderately severe
tricompartmental osteoarthritis is seen with osteophytes about all 3
compartments.
IMPRESSION: No acute abnormality.

Moderate to moderately severe osteoarthritis.

Small joint effusion.

## 2018-03-26 DIAGNOSIS — K420 Umbilical hernia with obstruction, without gangrene: Secondary | ICD-10-CM | POA: Diagnosis not present

## 2018-04-15 DIAGNOSIS — E785 Hyperlipidemia, unspecified: Secondary | ICD-10-CM | POA: Diagnosis not present

## 2018-04-15 DIAGNOSIS — N400 Enlarged prostate without lower urinary tract symptoms: Secondary | ICD-10-CM | POA: Diagnosis not present

## 2018-04-15 DIAGNOSIS — I10 Essential (primary) hypertension: Secondary | ICD-10-CM | POA: Diagnosis not present

## 2018-04-15 DIAGNOSIS — M545 Low back pain: Secondary | ICD-10-CM | POA: Diagnosis not present

## 2018-04-22 ENCOUNTER — Other Ambulatory Visit: Payer: Self-pay | Admitting: Primary Care

## 2018-04-22 DIAGNOSIS — E782 Mixed hyperlipidemia: Secondary | ICD-10-CM

## 2018-04-26 ENCOUNTER — Other Ambulatory Visit: Payer: Medicare Other

## 2018-05-06 DIAGNOSIS — M25551 Pain in right hip: Secondary | ICD-10-CM | POA: Diagnosis not present

## 2018-05-06 DIAGNOSIS — M25562 Pain in left knee: Secondary | ICD-10-CM | POA: Diagnosis not present

## 2018-05-06 DIAGNOSIS — M25561 Pain in right knee: Secondary | ICD-10-CM | POA: Diagnosis not present

## 2018-05-06 DIAGNOSIS — M545 Low back pain: Secondary | ICD-10-CM | POA: Diagnosis not present

## 2018-06-28 ENCOUNTER — Other Ambulatory Visit: Payer: Self-pay | Admitting: Primary Care

## 2018-06-28 DIAGNOSIS — E785 Hyperlipidemia, unspecified: Secondary | ICD-10-CM

## 2018-07-14 ENCOUNTER — Other Ambulatory Visit: Payer: Self-pay | Admitting: Primary Care

## 2018-07-14 DIAGNOSIS — I10 Essential (primary) hypertension: Secondary | ICD-10-CM

## 2018-08-03 ENCOUNTER — Telehealth: Payer: Self-pay | Admitting: Primary Care

## 2018-08-03 NOTE — Telephone Encounter (Signed)
Copied from CRM [id number]. Topic: Quick Communication - Rx Refill/Question >> Aug 03, 2018  1:08 PM Mcneil, Ja-Kwan wrote: Medication: atorvastatin (LIPITOR) 40 MG tablet  Has the patient contacted their pharmacy? no  Preferred Pharmacy (with phone number or street name): [id number] - [place], [place] - [address] [phone] (Phone) [phone] (Fax)   Agent: Please be advised that RX refills may take up to 3 business days. We ask that you follow-up with your pharmacy.

## 2018-08-03 NOTE — Telephone Encounter (Signed)
Pt notified that correct dose is atorvastatin 40 mg; he states that his dose was doubled; also informed him that prescription was being refilled at [facility]; he verbalizes understanding

## 2018-08-03 NOTE — Telephone Encounter (Signed)
Contacted pt regarding refill request for atorvastatin; chart show that prescription sent 03/15/18 #90 with 3 refills; verified pharmacy with pt; spoke with Marvell Fuller, [facility] and she said the pt last picked up 20 mg tablets; verified that prescription was changed to 20 mg on 03/15/18; will notify pt.

## 2018-09-09 ENCOUNTER — Encounter (INDEPENDENT_AMBULATORY_CARE_PROVIDER_SITE_OTHER): Payer: Self-pay

## 2018-09-09 ENCOUNTER — Encounter: Payer: Self-pay | Admitting: Primary Care

## 2018-09-09 ENCOUNTER — Ambulatory Visit (INDEPENDENT_AMBULATORY_CARE_PROVIDER_SITE_OTHER): Payer: Medicare Other | Admitting: Primary Care

## 2018-09-09 VITALS — BP 118/70 | HR 58 | Temp 98.3°F | Ht 70.0 in | Wt 312.8 lb

## 2018-09-09 DIAGNOSIS — G8929 Other chronic pain: Secondary | ICD-10-CM | POA: Diagnosis not present

## 2018-09-09 DIAGNOSIS — E785 Hyperlipidemia, unspecified: Secondary | ICD-10-CM | POA: Diagnosis not present

## 2018-09-09 DIAGNOSIS — M25561 Pain in right knee: Secondary | ICD-10-CM | POA: Diagnosis not present

## 2018-09-09 DIAGNOSIS — R972 Elevated prostate specific antigen [PSA]: Secondary | ICD-10-CM | POA: Diagnosis not present

## 2018-09-09 DIAGNOSIS — R7303 Prediabetes: Secondary | ICD-10-CM | POA: Diagnosis not present

## 2018-09-09 LAB — LIPID PANEL
Cholesterol: 162 mg/dL (ref 0–200)
HDL: 31.9 mg/dL — ABNORMAL LOW (ref >39.00)
LDL Cholesterol: 110 mg/dL — ABNORMAL HIGH (ref 0–99)
NonHDL: 130.58
TRIGLYCERIDES: 102 mg/dL (ref 0.0–149.0)
Total CHOL/HDL Ratio: 5
VLDL: 20.4 mg/dL (ref 0.0–40.0)

## 2018-09-09 LAB — COMPREHENSIVE METABOLIC PANEL
ALBUMIN: 4.5 g/dL (ref 3.5–5.2)
ALK PHOS: 48 U/L (ref 39–117)
ALT: 17 U/L (ref 0–53)
AST: 16 U/L (ref 0–37)
BUN: 18 mg/dL (ref 6–23)
CALCIUM: 9.8 mg/dL (ref 8.4–10.5)
CO2: 30 mEq/L (ref 19–32)
CREATININE: 1.09 mg/dL (ref 0.40–1.50)
Chloride: 102 mEq/L (ref 96–112)
GFR: 70.93 mL/min (ref >60.00)
Glucose, Bld: 126 mg/dL — ABNORMAL HIGH (ref 70–99)
POTASSIUM: 4.5 meq/L (ref 3.5–5.1)
SODIUM: 139 meq/L (ref 135–145)
TOTAL PROTEIN: 7.1 g/dL (ref 6.0–8.3)
Total Bilirubin: 0.8 mg/dL (ref 0.2–1.2)

## 2018-09-09 LAB — PSA: PSA: 6.46 ng/mL — AB (ref 0.10–4.00)

## 2018-09-09 LAB — HEMOGLOBIN A1C: HEMOGLOBIN A1C: 5.9 % (ref 4.6–6.5)

## 2018-09-09 NOTE — Patient Instructions (Addendum)
Stop by the lab prior to leaving today. I will notify you of your results once received.   Start exercising. You should be getting 150 minutes of exercise weekly.  Continue to work on [employer]. Limit sweet tea and sodas.  Stop caffeine and sugary drinks by lunchtime, this will reduce your frequency of urination at night. Limit liquids before bedtime. Please update me if your symptoms persist.  We will see you in April 2020 for your physical.  It was a pleasure to see you today!

## 2018-09-09 NOTE — Assessment & Plan Note (Signed)
Repeat A1C pending. Discussed to start exercising, reduce sugary drinks.

## 2018-09-09 NOTE — Assessment & Plan Note (Signed)
Repeat lipids pending. Compliant to atorvastatin 40 mg. Continue same.

## 2018-09-09 NOTE — Progress Notes (Signed)
Subjective:    Patient ID: Adam Jennings, male    DOB: [birth year], 72 y.o.   MRN: [id number]  HPI  Adam Jennings is a 72 year old male who presents today for follow up.  1) Prediabetes: A1C of 6.2 in April 2019.   Diet currently consists of:  Breakfast: Cereal  Lunch: Sandwich Dinner: Meat, vegetables, starch   Snacks: None Desserts: None Beverages: Water, soda, sweet tea,   Exercise: He is not exercising, active at work   2) Hyperlipidemia: Currently managed on atorvastatin 40 mg which was increased from 20 mg in April 2019. He is due for repeat lipid panel today, he did not return as requested last Spring. He denies myalgias.   3) Elevated PSA: Level of 7.05 from labs in April 2018, referred to Urology who notified him that his prostate was enlarged. His last visit with Urology was around 6 months ago. He continues to experience nocturia in the evening, every 1-2 hours on average. No urinary frequency during the day. He is drinking sweet tea with caffeine with dinner nearly every night.  BP Readings from Last 3 Encounters:  09/09/18 118/70  03/15/18 120/76  03/08/18 122/80     Review of Systems  Respiratory: Negative for shortness of breath.   Cardiovascular: Negative for chest pain.  Genitourinary:       Nocturia   Musculoskeletal: Positive for arthralgias.  Neurological: Negative for dizziness and numbness.       Past Medical History:  Diagnosis Date  . Dizziness   . Elevated blood pressure      Social History   Socioeconomic History  . Marital status: Married    Spouse name: Not on file  . Number of children: Not on file  . Years of education: Not on file  . Highest education level: Not on file  Occupational History  . Not on file  Social Needs  . Financial resource strain: Not on file  . Food insecurity:    Worry: Not on file    Inability: Not on file  . Transportation needs:    Medical: Not on file    Non-medical: Not on file  Tobacco Use  .  Smoking status: Never Smoker  . Smokeless tobacco: Never Used  Substance and Sexual Activity  . Alcohol use: No    Alcohol/week: 0.0 standard drinks  . Drug use: No  . Sexual activity: Not Currently  Lifestyle  . Physical activity:    Days per week: Not on file    Minutes per session: Not on file  . Stress: Not on file  Relationships  . Social connections:    Talks on phone: Not on file    Gets together: Not on file    Attends religious service: Not on file    Active member of club or organization: Not on file    Attends meetings of clubs or organizations: Not on file    Relationship status: Not on file  . Intimate partner violence:    Fear of current or ex partner: Not on file    Emotionally abused: Not on file    Physically abused: Not on file    Forced sexual activity: Not on file  Other Topics Concern  . Not on file  Social History Narrative   Married.   1 daughter, 4 grandchildren.   Works in [employer] in [place].   Enjoys working on tractors.     Past Surgical History:  Procedure Laterality Date  . CATARACT  EXTRACTION W/ INTRAOCULAR LENS IMPLANT Right 11/05/2017  . CATARACT EXTRACTION W/ INTRAOCULAR LENS IMPLANT Left 12/08/2017    Family History  Problem Relation Age of Onset  . Hypertension Father     No Known Allergies  Current Outpatient Medications on File Prior to Visit  Medication Sig Dispense Refill  . atorvastatin (LIPITOR) 40 MG tablet Take 1 tablet by mouth every evening for cholesterol. 90 tablet 3  . lisinopril (PRINIVIL,ZESTRIL) 20 MG tablet TAKE 1 TABLET BY MOUTH EVERY DAY 90 tablet 1  . meloxicam (MOBIC) 15 MG tablet Take 15 mg by mouth daily.    . Omega-3 Fatty Acids (FISH OIL) 1000 MG CAPS Take 1,000 mg by mouth daily.    Marland Kitchen triamcinolone ointment (KENALOG) 0.5 % Apply 1 application topically 2 (two) times daily. 30 g 0   No current facility-administered medications on file prior to visit.     BP 118/70   Pulse (!) 58   Temp 98.3 F (36.8  C) (Oral)   Ht 5\' 10"  (1.778 m)   Wt (!) 312 lb 12 oz (141.9 kg)   SpO2 98%   BMI 44.87 kg/m    Objective:   Physical Exam  Constitutional: He appears well-nourished.  Neck: Neck supple.  Cardiovascular: Normal rate and regular rhythm.  Respiratory: Effort normal and breath sounds normal.  Skin: Skin is warm and dry.           Assessment & Plan:

## 2018-09-09 NOTE — Assessment & Plan Note (Signed)
Following with orthopedics, will likely undergo surgical intervention this Fall/Winter 2019.

## 2018-09-09 NOTE — Assessment & Plan Note (Signed)
Repeat PSA pending.  Discussed to limit caffeine and sugary drinks after lunch in hopes of reducing nocturia. He will update.

## 2018-09-10 ENCOUNTER — Ambulatory Visit: Payer: Medicare Other | Admitting: Primary Care

## 2018-10-18 DIAGNOSIS — H524 Presbyopia: Secondary | ICD-10-CM | POA: Diagnosis not present

## 2018-10-18 DIAGNOSIS — H53001 Unspecified amblyopia, right eye: Secondary | ICD-10-CM | POA: Diagnosis not present

## 2018-10-18 DIAGNOSIS — H26493 Other secondary cataract, bilateral: Secondary | ICD-10-CM | POA: Diagnosis not present

## 2018-10-18 DIAGNOSIS — H1859 Other hereditary corneal dystrophies: Secondary | ICD-10-CM | POA: Diagnosis not present

## 2019-01-11 ENCOUNTER — Other Ambulatory Visit: Payer: Self-pay | Admitting: Primary Care

## 2019-01-11 DIAGNOSIS — I10 Essential (primary) hypertension: Secondary | ICD-10-CM

## 2019-03-14 ENCOUNTER — Ambulatory Visit (INDEPENDENT_AMBULATORY_CARE_PROVIDER_SITE_OTHER): Payer: Medicare Other

## 2019-03-14 ENCOUNTER — Other Ambulatory Visit: Payer: Self-pay | Admitting: Primary Care

## 2019-03-14 DIAGNOSIS — R972 Elevated prostate specific antigen [PSA]: Secondary | ICD-10-CM

## 2019-03-14 DIAGNOSIS — E785 Hyperlipidemia, unspecified: Secondary | ICD-10-CM

## 2019-03-14 DIAGNOSIS — R7303 Prediabetes: Secondary | ICD-10-CM

## 2019-03-14 DIAGNOSIS — Z0000 Encounter for general adult medical examination without abnormal findings: Secondary | ICD-10-CM | POA: Diagnosis not present

## 2019-03-14 NOTE — Progress Notes (Signed)
Subjective:   Adam HessRoger Leon Jennings is a 73 y.o. male who presents for Medicare Annual/Subsequent preventive examination.  Review of Systems: N/A Cardiac Risk Factors include: advanced age (>4155men, 40>65 women);male gender;obesity (BMI >30kg/m2);hypertension     Objective:    Vitals: There were no vitals taken for this visit.  There is no height or weight on file to calculate BMI.  Advanced Directives 03/14/2019 03/08/2018 03/05/2017 02/12/2016  Does Patient Have a Medical Advance Directive? No No Yes No  Type of Advance Directive - - Healthcare Power of Attorney -  Copy of Healthcare Power of Attorney in Chart? - - No - copy requested -  Would patient like information on creating a medical advance directive? No - Patient declined Yes (MAU/Ambulatory/Procedural Areas - Information given) - No - patient declined information    Tobacco Social History   Tobacco Use  Smoking Status Never Smoker  Smokeless Tobacco Never Used     Counseling given: No   Clinical Intake:  Pre-visit preparation completed: Yes  Pain : No/denies pain Pain Score: 0-No pain     Nutritional Status: BMI > 30  Obese Nutritional Risks: None Diabetes: No  How often do you need to have someone help you when you read instructions, pamphlets, or other written materials from your doctor or pharmacy?: 1 - Never What is the last grade level you completed in school?: 9th grade  Interpreter Needed?: No  Comments: pt lives with spouse Information entered by :: LPinson, LPN  Past Medical History:  Diagnosis Date  . Dizziness   . Elevated blood pressure    Past Surgical History:  Procedure Laterality Date  . CATARACT EXTRACTION W/ INTRAOCULAR LENS IMPLANT Right 11/05/2017  . CATARACT EXTRACTION W/ INTRAOCULAR LENS IMPLANT Left 12/08/2017   Family History  Problem Relation Age of Onset  . Hypertension Father    Social History   Socioeconomic History  . Marital status: Married    Spouse name: Not on file   . Number of children: Not on file  . Years of education: Not on file  . Highest education level: Not on file  Occupational History  . Not on file  Social Needs  . Financial resource strain: Not on file  . Food insecurity:    Worry: Not on file    Inability: Not on file  . Transportation needs:    Medical: Not on file    Non-medical: Not on file  Tobacco Use  . Smoking status: Never Smoker  . Smokeless tobacco: Never Used  Substance and Sexual Activity  . Alcohol use: No    Alcohol/week: 0.0 standard drinks  . Drug use: No  . Sexual activity: Not Currently  Lifestyle  . Physical activity:    Days per week: Not on file    Minutes per session: Not on file  . Stress: Not on file  Relationships  . Social connections:    Talks on phone: Not on file    Gets together: Not on file    Attends religious service: Not on file    Active member of club or organization: Not on file    Attends meetings of clubs or organizations: Not on file    Relationship status: Not on file  Other Topics Concern  . Not on file  Social History Narrative   Married.   1 daughter, 4 grandchildren.   Works in [employer] in [place]   Enjoys working on tractors.     Outpatient Encounter Medications as of 03/14/2019  Medication Sig  . atorvastatin (LIPITOR) 40 MG tablet Take 1 tablet by mouth every evening for cholesterol.  Marland Kitchen lisinopril (PRINIVIL,ZESTRIL) 20 MG tablet TAKE 1 TABLET BY MOUTH EVERY DAY  . Omega-3 Fatty Acids (FISH OIL) 1000 MG CAPS Take 1,000 mg by mouth daily.  . [DISCONTINUED] meloxicam (MOBIC) 15 MG tablet Take 15 mg by mouth daily.  . [DISCONTINUED] triamcinolone ointment (KENALOG) 0.5 % Apply 1 application topically 2 (two) times daily.   No facility-administered encounter medications on file as of 03/14/2019.     Activities of Daily Living In your present state of health, do you have any difficulty performing the following activities: 03/14/2019  Hearing? N  Vision? N  Difficulty  concentrating or making decisions? N  Walking or climbing stairs? N  Dressing or bathing? N  Doing errands, shopping? N  Preparing Food and eating ? N  Using the Toilet? N  In the past six months, have you accidently leaked urine? N  Do you have problems with loss of bowel control? N  Managing your Medications? N  Managing your Finances? N  Housekeeping or managing your Housekeeping? N  Some recent data might be hidden    Patient Care Team: Doreene Nest, NP as PCP - General (Internal Medicine)   Assessment:   This is a routine wellness examination for Adam Jennings.  Vision Screening Comments: Last vision exam in Jan 2020 with Dr. Burgess Estelle  Exercise Activities and Dietary recommendations Current Exercise Habits: Home exercise routine, Type of exercise: strength training/weights, Time (Minutes): 15, Frequency (Times/Week): 7, Weekly Exercise (Minutes/Week): 105, Exercise limited by: None identified  Goals    . Patient Stated     Starting 03/14/19, I will continue to do strengthening exercises for 15 minutes daily.        Fall Risk Fall Risk  03/14/2019 03/08/2018 03/05/2017  Falls in the past year? 0 No No  Depression Screen PHQ 2/9 Scores 03/14/2019 03/08/2018 03/05/2017  PHQ - 2 Score 0 0 0  PHQ- 9 Score 0 0 -    Cognitive Function MMSE - Mini Mental State Exam 03/14/2019 03/08/2018 03/05/2017  Orientation to time 5 5 5   Orientation to Place 5 5 5   Registration 3 3 3   Attention/ Calculation 0 0 0  Recall 3 2 3   Recall-comments - unable to recall 1 of 3 words -  Language- name 2 objects 0 0 0  Language- repeat 1 1 1   Language- follow 3 step command 0 3 3  Language- read & follow direction 0 0 0  Write a sentence 0 0 0  Copy design 0 0 0  Total score 17 19 20      PLEASE NOTE: A Mini-Cog screen was completed. Maximum score is 17. A value of 0 denotes this part of Folstein MMSE was not completed or the patient failed this part of the Mini-Cog screening.   Mini-Cog Screening  Orientation to Time - Max 5 pts Orientation to Place - Max 5 pts Registration - Max 3 pts Recall - Max 3 pts Language Repeat - Max 1 pts     There is no immunization history on file for this patient.  Screening Tests Health Maintenance  Topic Date Due  . TETANUS/TDAP  03/23/2026 (Originally 12/02/1966)  . PNA vac Low Risk Adult (1 of 2 - PCV13) 03/23/2026 (Originally 12/02/2012)  . COLONOSCOPY  03/06/2027 (Originally 12/02/1997)  . INFLUENZA VACCINE  06/25/2019  . Hepatitis C Screening  Completed       Plan:  I have personally reviewed, addressed, and noted the following in the patient's chart:  A. Medical and social history B. Use of alcohol, tobacco or illicit drugs  C. Current medications and supplements D. Functional ability and status E.  Nutritional status F.  Physical activity G. Advance directives H. List of other physicians I.  Hospitalizations, surgeries, and ER visits in previous 12 months J.  Vitals (unless it is a telemedicine encounter) K. Screenings to include hearing, vision, cognitive, depression L. Referrals and appointments   In addition, I have reviewed and discussed with patient certain preventive protocols, quality metrics, and best practice recommendations. A written personalized care plan for preventive services and recommendations were provided to patient.  With patient's permission, we connected on 03/14/19 at  2:30 PM EDT by a video enabled telemedicine application. Two patient identifiers were used to ensure the encounter occurred with the correct person. .   Patient was in home and writer was in office.   Signed,   Randa Evens, MHA, BS, LPN Health Coach

## 2019-03-14 NOTE — Patient Instructions (Signed)
Adam Jennings , Thank you for taking time to come for your Medicare Wellness Visit. I appreciate your ongoing commitment to your health goals. Please review the following plan we discussed and let me know if I can assist you in the future.   These are the goals we discussed: Goals    . Patient Stated     Starting 03/14/19, I will continue to do strengthening exercises for 15 minutes daily.        This is a list of the screening recommended for you and due dates:  Health Maintenance  Topic Date Due  . Tetanus Vaccine  03/23/2026*  . Pneumonia vaccines (1 of 2 - PCV13) 03/23/2026*  . Colon Cancer Screening  03/06/2027*  . Flu Shot  06/25/2019  .  Hepatitis C: One time screening is recommended by Center for Disease Control  (CDC) for  adults born from [birth year] through 1963.   Completed  *Topic was postponed. The date shown is not the original due date.   Preventive Care for Adults  A healthy lifestyle and preventive care can promote health and wellness. Preventive health guidelines for adults include the following key practices.  . A routine yearly physical is a good way to check with your health care provider about your health and preventive screening. It is a chance to share any concerns and updates on your health and to receive a thorough exam.  . Visit your dentist for a routine exam and preventive care every 6 months. Brush your teeth twice a day and floss once a day. Good oral hygiene prevents tooth decay and gum disease.  . The frequency of eye exams is based on your age, health, family medical history, use  of contact lenses, and other factors. Follow your health care provider's recommendations for frequency of eye exams.  . Eat a healthy diet. Foods like vegetables, fruits, whole grains, low-fat dairy products, and lean protein foods contain the nutrients you need without too many calories. Decrease your intake of foods high in solid fats, added sugars, and salt. Eat the right amount of  calories for you. Get information about a proper diet from your health care provider, if necessary.  . Regular physical exercise is one of the most important things you can do for your health. Most adults should get at least 150 minutes of moderate-intensity exercise (any activity that increases your heart rate and causes you to sweat) each week. In addition, most adults need muscle-strengthening exercises on 2 or more days a week.  Silver Sneakers may be a benefit available to you. To determine eligibility, you may visit the website: www.silversneakers.com or contact program at (507) 397-4083 Mon-Fri between 8AM-8PM.   . Maintain a healthy weight. The body mass index (BMI) is a screening tool to identify possible weight problems. It provides an estimate of body fat based on height and weight. Your health care provider can find your BMI and can help you achieve or maintain a healthy weight.   For adults 20 years and older: ? A BMI below 18.5 is considered underweight. ? A BMI of 18.5 to 24.9 is normal. ? A BMI of 25 to 29.9 is considered overweight. ? A BMI of 30 and above is considered obese.   . Maintain normal blood lipids and cholesterol levels by exercising and minimizing your intake of saturated fat. Eat a balanced diet with plenty of fruit and vegetables. Blood tests for lipids and cholesterol should begin at age 25 and be repeated every 5  years. If your lipid or cholesterol levels are high, you are over 50, or you are at high risk for heart disease, you may need your cholesterol levels checked more frequently. Ongoing high lipid and cholesterol levels should be treated with medicines if diet and exercise are not working.  . If you smoke, find out from your health care provider how to quit. If you do not use tobacco, please do not start.  . If you choose to drink alcohol, please do not consume more than 2 drinks per day. One drink is considered to be 12 ounces (355 mL) of beer, 5 ounces  (148 mL) of wine, or 1.5 ounces (44 mL) of liquor.  . If you are 22-17 years old, ask your health care provider if you should take aspirin to prevent strokes.  . Use sunscreen. Apply sunscreen liberally and repeatedly throughout the day. You should seek shade when your shadow is shorter than you. Protect yourself by wearing long sleeves, pants, a wide-brimmed hat, and sunglasses year round, whenever you are outdoors.  . Once a month, do a whole body skin exam, using a mirror to look at the skin on your back. Tell your health care provider of new moles, moles that have irregular borders, moles that are larger than a pencil eraser, or moles that have changed in shape or color.

## 2019-03-14 NOTE — Progress Notes (Signed)
I reviewed health advisor's note, was available for consultation, and agree with documentation and plan.  

## 2019-03-14 NOTE — Progress Notes (Signed)
PCP notes:   Health maintenance:  No gaps identified.  Abnormal screenings:   None  Patient concerns:   None  Nurse concerns:  None  Next PCP appt:   03/17/19 @ 0840

## 2019-03-15 ENCOUNTER — Other Ambulatory Visit (INDEPENDENT_AMBULATORY_CARE_PROVIDER_SITE_OTHER): Payer: Medicare Other

## 2019-03-15 ENCOUNTER — Ambulatory Visit: Payer: Medicare Other

## 2019-03-15 ENCOUNTER — Other Ambulatory Visit: Payer: Self-pay

## 2019-03-15 DIAGNOSIS — R972 Elevated prostate specific antigen [PSA]: Secondary | ICD-10-CM | POA: Diagnosis not present

## 2019-03-15 DIAGNOSIS — E785 Hyperlipidemia, unspecified: Secondary | ICD-10-CM

## 2019-03-15 DIAGNOSIS — R7303 Prediabetes: Secondary | ICD-10-CM | POA: Diagnosis not present

## 2019-03-15 LAB — LIPID PANEL
Cholesterol: 157 mg/dL (ref 0–200)
HDL: 31.7 mg/dL — ABNORMAL LOW (ref >39.00)
LDL Cholesterol: 109 mg/dL — ABNORMAL HIGH (ref 0–99)
NonHDL: 125.28
Total CHOL/HDL Ratio: 5
Triglycerides: 80 mg/dL (ref 0.0–149.0)
VLDL: 16 mg/dL (ref 0.0–40.0)

## 2019-03-15 LAB — COMPREHENSIVE METABOLIC PANEL
ALT: 23 U/L (ref 0–53)
AST: 21 U/L (ref 0–37)
Albumin: 4.4 g/dL (ref 3.5–5.2)
Alkaline Phosphatase: 50 U/L (ref 39–117)
BUN: 20 mg/dL (ref 6–23)
CO2: 31 mEq/L (ref 19–32)
Calcium: 9.8 mg/dL (ref 8.4–10.5)
Chloride: 104 mEq/L (ref 96–112)
Creatinine, Ser: 1.03 mg/dL (ref 0.40–1.50)
GFR: 71.13 mL/min (ref >60.00)
Glucose, Bld: 131 mg/dL — ABNORMAL HIGH (ref 70–99)
Potassium: 4.7 mEq/L (ref 3.5–5.1)
Sodium: 140 mEq/L (ref 135–145)
Total Bilirubin: 0.8 mg/dL (ref 0.2–1.2)
Total Protein: 7.2 g/dL (ref 6.0–8.3)

## 2019-03-16 LAB — HEMOGLOBIN A1C: Hgb A1c MFr Bld: 6.4 % (ref 4.6–6.5)

## 2019-03-16 LAB — PSA: PSA: 6.66 ng/mL — ABNORMAL HIGH (ref 0.10–4.00)

## 2019-03-16 NOTE — Addendum Note (Signed)
Addended by: Alvina Chou on: 03/16/2019 08:16 AM   Modules accepted: Orders

## 2019-03-17 ENCOUNTER — Encounter: Payer: Self-pay | Admitting: Primary Care

## 2019-03-17 ENCOUNTER — Ambulatory Visit (INDEPENDENT_AMBULATORY_CARE_PROVIDER_SITE_OTHER): Payer: Medicare Other | Admitting: Primary Care

## 2019-03-17 DIAGNOSIS — R7303 Prediabetes: Secondary | ICD-10-CM

## 2019-03-17 DIAGNOSIS — E785 Hyperlipidemia, unspecified: Secondary | ICD-10-CM | POA: Diagnosis not present

## 2019-03-17 DIAGNOSIS — Z23 Encounter for immunization: Secondary | ICD-10-CM | POA: Diagnosis not present

## 2019-03-17 DIAGNOSIS — I10 Essential (primary) hypertension: Secondary | ICD-10-CM | POA: Diagnosis not present

## 2019-03-17 DIAGNOSIS — R972 Elevated prostate specific antigen [PSA]: Secondary | ICD-10-CM | POA: Diagnosis not present

## 2019-03-17 MED ORDER — ZOSTER VAC RECOMB ADJUVANTED 50 MCG/0.5ML IM SUSR: 0.5000 mL | Freq: Once | INTRAMUSCULAR | 1 refills | Status: AC

## 2019-03-17 NOTE — Patient Instructions (Signed)
Continue your lisinopril 20 mg for blood pressure and atorvastatin 40 mg for cholesterol.  Start exercising. You should be getting 150 minutes of exercise weekly.  It's important to improve your diet by reducing consumption of fast food, fried food, processed snack foods, sugary drinks. Increase consumption of fresh vegetables and fruits, whole grains, water.  Ensure you are drinking 64 ounces of water daily.  Call your Urologist as discussed.  We will be in touch regarding a lab appointment to recheck the diabetes test.  It was a pleasure to see you today!

## 2019-03-17 NOTE — Assessment & Plan Note (Signed)
Recent lipid panel improved, encouraged him to continue to work on dietary changes.

## 2019-03-17 NOTE — Assessment & Plan Note (Signed)
Likely secondary to enlarged prostate, encouraged him to call his Urologist for follow up. PSA overall stable over the last one year.

## 2019-03-17 NOTE — Assessment & Plan Note (Signed)
Recent A1C of 6.4 which is an increase from October 2019. Discussed to work on diet, start some exercise.  Repeat in 3 months.

## 2019-03-17 NOTE — Progress Notes (Signed)
Subjective:    Patient ID: Adam Jennings, male    DOB: 04/12/1946, 73 y.o.   MRN: [id number]  HPI  Virtual Visit via Video Note  I connected with Adam Jennings on 03/17/19 at  8:40 AM EDT by a video enabled telemedicine application and verified that I am speaking with the correct person using two identifiers.   I discussed the limitations of evaluation and management by telemedicine and the availability of in person appointments. The patient expressed understanding and agreed to proceed. He is at home, I am in the office.  History of Present Illness:  Adam Jennings is a 73 year old male who presents today for MWV Part 2. He saw our health advisor last week.   1) Essential Hypertension: Currently managed on lisinopril 20 mg. He does not check his blood pressure at home. He denies chest pain, dizziness, shortness of breath.   BP Readings from Last 3 Encounters:  09/09/18 118/70  03/15/18 120/76  03/08/18 122/80   2) Hyperlipidemia: Currently managed on atorvastatin 40 mg daily. Recent lipid panel with LDL of 109, Trigs of 80, TC of 157.  Diet currently consists of:  Breakfast: Cereal  Lunch: Sandwich Dinner: Pasta, fried chicken, casseroles  Snacks: None Desserts: 3-4 times weekly  Beverages: Water, sweet tea, Gatorade   Exercise: He is doing weights with upper extremities   Immunizations: -Influenza: Due this Fall -Pneumonia: Completed per patient -Shingles: Never completed  Colonoscopy: Completed in 2014, due in 2024 PSA: 6.66 yesterday, 6.46 in October 2019, 7.05 in April 2018 Hep C Screen: Negative   Observations/Objective:  Alert and oriented. Speaking in complete sentences. No distress.  Assessment and Plan:  See problem based charting.  Follow Up Instructions:    I discussed the assessment and treatment plan with the patient. The patient was provided an opportunity to ask questions and all were answered. The patient agreed with the plan and  demonstrated an understanding of the instructions.   The patient was advised to call back or seek an in-person evaluation if the symptoms worsen or if the condition fails to improve as anticipated.    Doreene NestKatherine K Faelynn Wynder, NP    Review of Systems  Eyes: Negative for visual disturbance.  Respiratory: Negative for shortness of breath.   Gastrointestinal: Negative for constipation and diarrhea.  Genitourinary:       Enlarged prostate, some difficulty urinating  Neurological: Negative for dizziness and numbness.  Psychiatric/Behavioral: The patient is not nervous/anxious.        Past Medical History:  Diagnosis Date  . Dizziness   . Elevated blood pressure      Social History   Socioeconomic History  . Marital status: Married    Spouse name: Not on file  . Number of children: Not on file  . Years of education: Not on file  . Highest education level: Not on file  Occupational History  . Not on file  Social Needs  . Financial resource strain: Not on file  . Food insecurity:    Worry: Not on file    Inability: Not on file  . Transportation needs:    Medical: Not on file    Non-medical: Not on file  Tobacco Use  . Smoking status: Never Smoker  . Smokeless tobacco: Never Used  Substance and Sexual Activity  . Alcohol use: No    Alcohol/week: 0.0 standard drinks  . Drug use: No  . Sexual activity: Not Currently  Lifestyle  . Physical activity:  Days per week: Not on file    Minutes per session: Not on file  . Stress: Not on file  Relationships  . Social connections:    Talks on phone: Not on file    Gets together: Not on file    Attends religious service: Not on file    Active member of club or organization: Not on file    Attends meetings of clubs or organizations: Not on file    Relationship status: Not on file  . Intimate partner violence:    Fear of current or ex partner: Not on file    Emotionally abused: Not on file    Physically abused: Not on file     Forced sexual activity: Not on file  Other Topics Concern  . Not on file  Social History Narrative   Married.   1 daughter, 4 grandchildren.   Works in [employer] in [place].   Enjoys working on tractors.     Past Surgical History:  Procedure Laterality Date  . CATARACT EXTRACTION W/ INTRAOCULAR LENS IMPLANT Right 11/05/2017  . CATARACT EXTRACTION W/ INTRAOCULAR LENS IMPLANT Left 12/08/2017    Family History  Problem Relation Age of Onset  . Hypertension Father     No Known Allergies  Current Outpatient Medications on File Prior to Visit  Medication Sig Dispense Refill  . atorvastatin (LIPITOR) 40 MG tablet Take 1 tablet by mouth every evening for cholesterol. 90 tablet 3  . lisinopril (PRINIVIL,ZESTRIL) 20 MG tablet TAKE 1 TABLET BY MOUTH EVERY DAY 90 tablet 1  . Omega-3 Fatty Acids (FISH OIL) 1000 MG CAPS Take 1,000 mg by mouth daily.     No current facility-administered medications on file prior to visit.     There were no vitals taken for this visit.   Objective:   Physical Exam  Constitutional: He is oriented to person, place, and time. He appears well-nourished.  Respiratory: Effort normal. No respiratory distress.  Neurological: He is alert and oriented to person, place, and time.  Skin: Skin is dry.           Assessment & Plan:

## 2019-03-17 NOTE — Assessment & Plan Note (Signed)
No recent BP check. Prior BP levels have been stable. Continue lisinopril. CMP unremarkable.

## 2019-05-19 DIAGNOSIS — M25562 Pain in left knee: Secondary | ICD-10-CM | POA: Diagnosis not present

## 2019-05-19 DIAGNOSIS — M25561 Pain in right knee: Secondary | ICD-10-CM | POA: Diagnosis not present

## 2019-05-29 ENCOUNTER — Other Ambulatory Visit: Payer: Self-pay | Admitting: Primary Care

## 2019-05-29 DIAGNOSIS — E785 Hyperlipidemia, unspecified: Secondary | ICD-10-CM

## 2019-06-16 ENCOUNTER — Other Ambulatory Visit (INDEPENDENT_AMBULATORY_CARE_PROVIDER_SITE_OTHER): Payer: Medicare Other

## 2019-06-16 DIAGNOSIS — R7303 Prediabetes: Secondary | ICD-10-CM | POA: Diagnosis not present

## 2019-06-16 LAB — POCT GLYCOSYLATED HEMOGLOBIN (HGB A1C): Hemoglobin A1C: 6 % — AB (ref 4.0–5.6)

## 2019-06-21 ENCOUNTER — Encounter: Payer: Self-pay | Admitting: *Deleted

## 2019-07-12 ENCOUNTER — Other Ambulatory Visit: Payer: Self-pay | Admitting: Primary Care

## 2019-07-12 DIAGNOSIS — I10 Essential (primary) hypertension: Secondary | ICD-10-CM

## 2019-09-08 ENCOUNTER — Other Ambulatory Visit: Payer: Self-pay

## 2019-09-08 DIAGNOSIS — Z20822 Contact with and (suspected) exposure to covid-19: Secondary | ICD-10-CM

## 2019-09-09 LAB — NOVEL CORONAVIRUS, NAA: SARS-CoV-2, NAA: NOT DETECTED

## 2019-09-28 ENCOUNTER — Other Ambulatory Visit: Payer: Self-pay | Admitting: Primary Care

## 2019-09-28 DIAGNOSIS — E785 Hyperlipidemia, unspecified: Secondary | ICD-10-CM

## 2019-10-06 DIAGNOSIS — M25561 Pain in right knee: Secondary | ICD-10-CM | POA: Diagnosis not present

## 2019-10-06 DIAGNOSIS — M25562 Pain in left knee: Secondary | ICD-10-CM | POA: Diagnosis not present

## 2019-10-21 ENCOUNTER — Other Ambulatory Visit: Payer: Self-pay | Admitting: Primary Care

## 2019-10-21 DIAGNOSIS — I10 Essential (primary) hypertension: Secondary | ICD-10-CM

## 2019-10-24 DIAGNOSIS — H524 Presbyopia: Secondary | ICD-10-CM | POA: Diagnosis not present

## 2019-10-24 DIAGNOSIS — H53001 Unspecified amblyopia, right eye: Secondary | ICD-10-CM | POA: Diagnosis not present

## 2019-10-24 DIAGNOSIS — H18593 Other hereditary corneal dystrophies, bilateral: Secondary | ICD-10-CM | POA: Diagnosis not present

## 2019-10-24 DIAGNOSIS — H26492 Other secondary cataract, left eye: Secondary | ICD-10-CM | POA: Diagnosis not present

## 2020-03-29 ENCOUNTER — Other Ambulatory Visit: Payer: Self-pay | Admitting: Primary Care

## 2020-03-29 DIAGNOSIS — E785 Hyperlipidemia, unspecified: Secondary | ICD-10-CM

## 2020-04-05 ENCOUNTER — Other Ambulatory Visit: Payer: Self-pay

## 2020-04-05 ENCOUNTER — Ambulatory Visit (INDEPENDENT_AMBULATORY_CARE_PROVIDER_SITE_OTHER): Payer: Medicare PPO | Admitting: Primary Care

## 2020-04-05 DIAGNOSIS — R972 Elevated prostate specific antigen [PSA]: Secondary | ICD-10-CM | POA: Diagnosis not present

## 2020-04-05 DIAGNOSIS — I10 Essential (primary) hypertension: Secondary | ICD-10-CM | POA: Diagnosis not present

## 2020-04-05 DIAGNOSIS — E785 Hyperlipidemia, unspecified: Secondary | ICD-10-CM

## 2020-04-05 DIAGNOSIS — R7303 Prediabetes: Secondary | ICD-10-CM

## 2020-04-05 DIAGNOSIS — R202 Paresthesia of skin: Secondary | ICD-10-CM

## 2020-04-05 DIAGNOSIS — R351 Nocturia: Secondary | ICD-10-CM | POA: Insufficient documentation

## 2020-04-05 DIAGNOSIS — R200 Anesthesia of skin: Secondary | ICD-10-CM

## 2020-04-05 LAB — CBC
HCT: 40.9 % (ref 39.0–52.0)
Hemoglobin: 14.3 g/dL (ref 13.0–17.0)
MCHC: 35.1 g/dL (ref 30.0–36.0)
MCV: 92.6 fl (ref 78.0–100.0)
Platelets: 194 10*3/uL (ref 150.0–400.0)
RBC: 4.42 Mil/uL (ref 4.22–5.81)
RDW: 13.5 % (ref 11.5–15.5)
WBC: 7.1 10*3/uL (ref 4.0–10.5)

## 2020-04-05 LAB — LIPID PANEL
Cholesterol: 173 mg/dL (ref 0–200)
HDL: 33.4 mg/dL — ABNORMAL LOW (ref >39.00)
LDL Cholesterol: 122 mg/dL — ABNORMAL HIGH (ref 0–99)
NonHDL: 139.77
Total CHOL/HDL Ratio: 5
Triglycerides: 91 mg/dL (ref 0.0–149.0)
VLDL: 18.2 mg/dL (ref 0.0–40.0)

## 2020-04-05 LAB — COMPREHENSIVE METABOLIC PANEL
ALT: 24 U/L (ref 0–53)
AST: 27 U/L (ref 0–37)
Albumin: 4.6 g/dL (ref 3.5–5.2)
Alkaline Phosphatase: 57 U/L (ref 39–117)
BUN: 18 mg/dL (ref 6–23)
CO2: 30 mEq/L (ref 19–32)
Calcium: 9.5 mg/dL (ref 8.4–10.5)
Chloride: 103 mEq/L (ref 96–112)
Creatinine, Ser: 0.99 mg/dL (ref 0.40–1.50)
GFR: 74.24 mL/min (ref >60.00)
Glucose, Bld: 132 mg/dL — ABNORMAL HIGH (ref 70–99)
Potassium: 4.7 mEq/L (ref 3.5–5.1)
Sodium: 137 mEq/L (ref 135–145)
Total Bilirubin: 0.9 mg/dL (ref 0.2–1.2)
Total Protein: 7.2 g/dL (ref 6.0–8.3)

## 2020-04-05 LAB — PSA, MEDICARE: PSA: 7.54 ng/ml — ABNORMAL HIGH (ref 0.10–4.00)

## 2020-04-05 LAB — HEMOGLOBIN A1C: Hgb A1c MFr Bld: 6.3 % (ref 4.6–6.5)

## 2020-04-05 NOTE — Assessment & Plan Note (Signed)
Symptoms of nocturia and is waking 8 times during the night. Repeat PSA pending, has not seen Urology in over one year.

## 2020-04-05 NOTE — Assessment & Plan Note (Signed)
Compliant to atorvastatin, repeat lipids pending. 

## 2020-04-05 NOTE — Patient Instructions (Addendum)
Stop by the lab prior to leaving today. I will notify you of your results once received.   Please notify me if your neck starts to bother you, or if your arm tingling increases/becomes bothersome.  Continue exercising. You should be getting 150 minutes of moderate intensity exercise weekly.  It's important to improve your diet by reducing consumption of fast food, fried food, processed snack foods, sugary drinks. Increase consumption of fresh vegetables and fruits, whole grains, water.  Ensure you are drinking 64 ounces of water daily.  It was a pleasure to see you today!

## 2020-04-05 NOTE — Assessment & Plan Note (Signed)
Occurring 8 times nightly on average. Evaluated by Urology in the past, no treatment. Repeat PSA pending.  Consider tamsulosin.

## 2020-04-05 NOTE — Assessment & Plan Note (Signed)
Discussed the importance of a healthy diet and regular exercise in order for weight loss, and to reduce the risk of any potential medical problems.  Repeat A1C pending. 

## 2020-04-05 NOTE — Progress Notes (Signed)
Subjective:    Patient ID: Adam Jennings, male    DOB: 01-12-1946, 74 y.o.   MRN: [id number]  HPI  This visit occurred during the SARS-CoV-2 public health emergency.  Safety protocols were in place, including screening questions prior to the visit, additional usage of staff PPE, and extensive cleaning of exam room while observing appropriate contact time as indicated for disinfecting solutions.   Adam Jennings is a 74 year old male who presents today for follow up and a chief complaint of shoulder pain.  1) Essential Hypertension: Currently managed on lisinopril 20 mg. He does not check his blood pressure at home. He denies dizziness, chest pain, shortness of breath.    BP Readings from Last 3 Encounters:  04/05/20 138/78  09/09/18 118/70  03/15/18 120/76   2) Hyperlipidemia: Currently managed on atorvastatin 40 mg. Due for repeat lipid panel.   3) Numbness/Tingling: Intermittent occurring only when he leans his body forward. Located to the left shoulder extending to left medial wrist. The tingling will last a few seconds. He has no symptoms with other movements. This began about one month ago. 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 He also lifts weight nightly.   He denies injury/trauma, weakness, pain. He does have chronic neck symptoms of which "pops and cracks" when he turns his head right or left. He denies neck pain and his left arm numbness isn't bothersome.   4) Nocutria: Getting up 8 times nightly most every night. Was evaluated by Urology several years ago but wasn't placed on treatment. He is not sure why.   Review of Systems  Eyes: Negative for visual disturbance.  Respiratory: Negative for shortness of breath.   Cardiovascular: Negative for chest pain.  Skin: Negative for color change.  Neurological: Positive for numbness. Negative for dizziness.       Past Medical History:  Diagnosis Date  . Dizziness   . Elevated blood pressure      Social History    Socioeconomic History  . Marital status: Married    Spouse name: Not on file  . Number of children: Not on file  . Years of education: Not on file  . Highest education level: Not on file  Occupational History  . Not on file  Tobacco Use  . Smoking status: Never Smoker  . Smokeless tobacco: Never Used  Substance and Sexual Activity  . Alcohol use: No    Alcohol/week: 0.0 standard drinks  . Drug use: No  . Sexual activity: Not Currently  Other Topics Concern  . Not on file  Social History Narrative   Married.   1 daughter, 4 grandchildren.   Works in [employer] in [place].   Enjoys working on tractors.    Social Determinants of Health   Financial Resource Strain:   . Difficulty of Paying Living Expenses:   Food Insecurity:   . Worried About Charity fundraiser in the Last Year:   . Arboriculturist in the Last Year:   Transportation Needs:   . Film/video editor (Medical):   Marland Kitchen Lack of Transportation (Non-Medical):   Physical Activity:   . Days of Exercise per Week:   . Minutes of Exercise per Session:   Stress:   . Feeling of Stress :   Social Connections:   . Frequency of Communication with Friends and Family:   . Frequency of Social Gatherings with Friends and Family:   . Attends Religious Services:   . Active  Member of Clubs or Organizations:   . Attends Banker Meetings:   Marland Kitchen Marital Status:   Intimate Partner Violence:   . Fear of Current or Ex-Partner:   . Emotionally Abused:   Marland Kitchen Physically Abused:   . Sexually Abused:     Past Surgical History:  Procedure Laterality Date  . CATARACT EXTRACTION W/ INTRAOCULAR LENS IMPLANT Right 11/05/2017  . CATARACT EXTRACTION W/ INTRAOCULAR LENS IMPLANT Left 12/08/2017    Family History  Problem Relation Age of Onset  . Hypertension Father     No Known Allergies  Current Outpatient Medications on File Prior to Visit  Medication Sig Dispense Refill  . atorvastatin (LIPITOR) 40 MG tablet TAKE 1  TABLET BY MOUTH EVERY DAY IN THE EVENING FOR CHOLESTEROL 90 tablet 1  . lisinopril (ZESTRIL) 20 MG tablet TAKE 1 TABLET BY MOUTH EVERY DAY 90 tablet 1  . Omega-3 Fatty Acids (FISH OIL) 1000 MG CAPS Take 1,000 mg by mouth daily.     No current facility-administered medications on file prior to visit.    BP 138/78   Pulse (!) 56   Temp (!) 96.7 F (35.9 C) (Temporal)   Ht 5\' 10"  (1.778 m)   Wt (!) 335 lb 4 oz (152.1 kg)   SpO2 97%   BMI 48.10 kg/m    Objective:   Physical Exam  Constitutional: He appears well-nourished.  Cardiovascular: Normal rate and regular rhythm.  Respiratory: Effort normal and breath sounds normal.  Musculoskeletal:     Cervical back: Neck supple.  Skin: Skin is warm and dry.  Psychiatric: He has a normal mood and affect.           Assessment & Plan:

## 2020-04-05 NOTE — Assessment & Plan Note (Signed)
Without pain to shoulder or neck, no trauma. Suspect cervical spine cause given normal ROM to extremity with neck symptoms upon ROM.  Offered cervical spine xray, he kindly declines as symptoms are not bothersome. He will update.

## 2020-04-05 NOTE — Assessment & Plan Note (Signed)
Overall stable given age. Continue lisinopril. Encouraged weight loss. CMP pending.

## 2020-04-10 ENCOUNTER — Other Ambulatory Visit: Payer: Self-pay | Admitting: Primary Care

## 2020-04-10 DIAGNOSIS — E785 Hyperlipidemia, unspecified: Secondary | ICD-10-CM

## 2020-04-10 MED ORDER — ROSUVASTATIN CALCIUM 20 MG PO TABS: 20.0000 mg | ORAL_TABLET | Freq: Every day | ORAL | 3 refills | Status: DC

## 2020-06-11 ENCOUNTER — Ambulatory Visit: Payer: Medicare PPO | Admitting: Primary Care

## 2020-06-12 ENCOUNTER — Other Ambulatory Visit: Payer: Self-pay

## 2020-06-12 ENCOUNTER — Ambulatory Visit (INDEPENDENT_AMBULATORY_CARE_PROVIDER_SITE_OTHER): Payer: Medicare PPO | Admitting: Primary Care

## 2020-06-12 VITALS — BP 134/72 | HR 58 | Temp 95.5°F | Ht 70.0 in | Wt 320.5 lb

## 2020-06-12 DIAGNOSIS — R972 Elevated prostate specific antigen [PSA]: Secondary | ICD-10-CM

## 2020-06-12 DIAGNOSIS — E785 Hyperlipidemia, unspecified: Secondary | ICD-10-CM

## 2020-06-12 DIAGNOSIS — R351 Nocturia: Secondary | ICD-10-CM

## 2020-06-12 LAB — PSA, MEDICARE: PSA: 6.74 ng/ml — ABNORMAL HIGH (ref 0.10–4.00)

## 2020-06-12 LAB — LIPID PANEL
Cholesterol: 169 mg/dL (ref 0–200)
HDL: 34.1 mg/dL — ABNORMAL LOW (ref >39.00)
LDL Cholesterol: 113 mg/dL — ABNORMAL HIGH (ref 0–99)
NonHDL: 134.46
Total CHOL/HDL Ratio: 5
Triglycerides: 107 mg/dL (ref 0.0–149.0)
VLDL: 21.4 mg/dL (ref 0.0–40.0)

## 2020-06-12 MED ORDER — TAMSULOSIN HCL 0.4 MG PO CAPS: 0.4000 mg | ORAL_CAPSULE | Freq: Every day | ORAL | 0 refills | Status: DC

## 2020-06-12 NOTE — Assessment & Plan Note (Signed)
Compliant to rosuvastatin, repeat lipids pending. 

## 2020-06-12 NOTE — Assessment & Plan Note (Signed)
Continued. Rx for tamsulosin sent to pharmacy. He will update in 2-3 weeks. Repeat PSA pending, will also repeat again in a few months.

## 2020-06-12 NOTE — Assessment & Plan Note (Signed)
Continued elevated readings, but he dose have LUTS. For some reason the tamsulosin was not sent to his pharmacy last visit, so he does agree today.   Prostate exam today with enlarged prostate. Repeat PSA pending today. Rx for tamsulosin sent to pharmacy.   He will update in 2-3 weeks. Repeat PSA again in the near future.

## 2020-06-12 NOTE — Progress Notes (Signed)
Subjective:    Patient ID: Adam Jennings, male    DOB: [birth year], 74 y.o.   MRN: [id number]  HPI  This visit occurred during the SARS-CoV-2 public health emergency.  Safety protocols were in place, including screening questions prior to the visit, additional usage of staff PPE, and extensive cleaning of exam room while observing appropriate contact time as indicated for disinfecting solutions.   Adam Jennings is a 74 year old male with a history of hyperlipidemia, prediabetes, hypertension, nocturia who presents today for follow up.  1) Nocturia: Chronic and also with elevated PSA levels with last reading of 7.54 in May 2021. PSA of 6.66 in April 2020, 6.46 in October 2019. PSA lab returned after his visit so given his elevated readings coupled with LUTS we discussed the potential for adding Flomax and asked him to follow up today.   Since his last visit he continues to experience nocturia 2-5 times nightly which is typical. Also with chronic difficulty initiating urinary stream mostly in the morning hours. The prescription for tamsulosin was actually never sent in.  He's cut back on the caffeine at night including sweet tea.   He saw Urology over one year ago who told him "everything looks fine". He's not returned since.   2) Hyperlipidemia: Currently managed on rosuvastatin which was changed from atorvastatin last visit due to continued elevated LDL of 122 with ASCVD risk score of 29%.   Today he endorses compliance to rosuvastatin.   BP Readings from Last 3 Encounters:  06/12/20 134/72  04/05/20 138/78  09/09/18 118/70   Wt Readings from Last 3 Encounters:  06/12/20 (!) 320 lb 8 oz (145.4 kg)  04/05/20 (!) 335 lb 4 oz (152.1 kg)  09/09/18 (!) 312 lb 12 oz (141.9 kg)     Review of Systems  Respiratory: Negative for shortness of breath.   Cardiovascular: Negative for chest pain.  Genitourinary: Positive for difficulty urinating.       Nocturia   Neurological: Negative for  dizziness.       Past Medical History:  Diagnosis Date  . Dizziness   . Elevated blood pressure      Social History   Socioeconomic History  . Marital status: Married    Spouse name: Not on file  . Number of children: Not on file  . Years of education: Not on file  . Highest education level: Not on file  Occupational History  . Not on file  Tobacco Use  . Smoking status: Never Smoker  . Smokeless tobacco: Never Used  Vaping Use  . Vaping Use: Never used  Substance and Sexual Activity  . Alcohol use: No    Alcohol/week: 0.0 standard drinks  . Drug use: No  . Sexual activity: Not Currently  Other Topics Concern  . Not on file  Social History Narrative   Married.   1 daughter, 4 grandchildren.   Works in [employer] in [place].   Enjoys working on tractors.    Social Determinants of Health   Financial Resource Strain:   . Difficulty of Paying Living Expenses:   Food Insecurity:   . Worried About Programme researcher, broadcasting/film/video in the Last Year:   . Barista in the Last Year:   Transportation Needs:   . Freight forwarder (Medical):   Marland Kitchen Lack of Transportation (Non-Medical):   Physical Activity:   . Days of Exercise per Week:   . Minutes of Exercise per Session:   Stress:   .  Feeling of Stress :   Social Connections:   . Frequency of Communication with Friends and Family:   . Frequency of Social Gatherings with Friends and Family:   . Attends Religious Services:   . Active Member of Clubs or Organizations:   . Attends Banker Meetings:   Marland Kitchen Marital Status:   Intimate Partner Violence:   . Fear of Current or Ex-Partner:   . Emotionally Abused:   Marland Kitchen Physically Abused:   . Sexually Abused:     Past Surgical History:  Procedure Laterality Date  . CATARACT EXTRACTION W/ INTRAOCULAR LENS IMPLANT Right 11/05/2017  . CATARACT EXTRACTION W/ INTRAOCULAR LENS IMPLANT Left 12/08/2017    Family History  Problem Relation Age of Onset  . Hypertension Father      No Known Allergies  Current Outpatient Medications on File Prior to Visit  Medication Sig Dispense Refill  . lisinopril (ZESTRIL) 20 MG tablet TAKE 1 TABLET BY MOUTH EVERY DAY 90 tablet 1  . Omega-3 Fatty Acids (FISH OIL) 1000 MG CAPS Take 1,000 mg by mouth daily.    . rosuvastatin (CRESTOR) 20 MG tablet Take 1 tablet (20 mg total) by mouth daily. For cholesterol. 90 tablet 3   No current facility-administered medications on file prior to visit.    BP 134/72   Pulse (!) 58   Temp (!) 95.5 F (35.3 C) (Temporal)   Ht 5\' 10"  (1.778 m)   Wt (!) 320 lb 8 oz (145.4 kg)   SpO2 96%   BMI 45.99 kg/m    Objective:   Physical Exam Cardiovascular:     Rate and Rhythm: Normal rate and regular rhythm.  Pulmonary:     Effort: Pulmonary effort is normal.     Breath sounds: Normal breath sounds.  Genitourinary:    Prostate: Enlarged. Not tender and no nodules present.     Comments: Chaperone present  Neurological:     Mental Status: He is alert.            Assessment & Plan:

## 2020-06-12 NOTE — Patient Instructions (Signed)
Start tamsulosin 0.4 mg once daily for urinary symptoms.  Stop by the lab prior to leaving today. I will notify you of your results once received.   Please update me in 3 weeks regarding the new medication.   It was a pleasure to see you today!

## 2020-06-26 ENCOUNTER — Telehealth: Payer: Self-pay | Admitting: Primary Care

## 2020-06-26 NOTE — Telephone Encounter (Signed)
-----   Message from Doreene Nest, NP sent at 06/12/2020 11:10 AM EDT ----- Regarding: Tamsulosin How's he doing since we initiated tamsulosin (Flomax) for urinary symptoms? Is he still getting up during the night?  Does he want to continue the medication?

## 2020-06-26 NOTE — Telephone Encounter (Signed)
Message left for patient to return my call.  

## 2020-07-07 ENCOUNTER — Other Ambulatory Visit: Payer: Self-pay | Admitting: Primary Care

## 2020-07-07 DIAGNOSIS — R351 Nocturia: Secondary | ICD-10-CM

## 2020-07-09 NOTE — Telephone Encounter (Signed)
Spoken to patient and he stated that he only took it 3 times then stop. It make him feel worse not better.

## 2020-07-11 NOTE — Telephone Encounter (Signed)
Does he want to see Urology for his urinary symptoms? I recommend it, especially since his prostate levels have been elevated. As discussed before, the elevated prostate level could be secondary to his symptoms.

## 2020-07-11 NOTE — Telephone Encounter (Signed)
Noted  

## 2020-07-11 NOTE — Telephone Encounter (Signed)
Message left for patient to return my call.  

## 2020-07-11 NOTE — Telephone Encounter (Signed)
Patient called back and decline on seeing urology but he stated that if he change his mind, he will call

## 2020-07-12 ENCOUNTER — Other Ambulatory Visit: Payer: Self-pay | Admitting: Primary Care

## 2020-07-12 DIAGNOSIS — I10 Essential (primary) hypertension: Secondary | ICD-10-CM

## 2020-08-02 ENCOUNTER — Other Ambulatory Visit: Payer: Self-pay | Admitting: Primary Care

## 2020-08-02 DIAGNOSIS — R972 Elevated prostate specific antigen [PSA]: Secondary | ICD-10-CM

## 2020-08-02 DIAGNOSIS — E785 Hyperlipidemia, unspecified: Secondary | ICD-10-CM

## 2020-08-14 ENCOUNTER — Other Ambulatory Visit: Payer: Self-pay

## 2020-08-14 ENCOUNTER — Other Ambulatory Visit (INDEPENDENT_AMBULATORY_CARE_PROVIDER_SITE_OTHER): Payer: Medicare PPO

## 2020-08-14 DIAGNOSIS — R972 Elevated prostate specific antigen [PSA]: Secondary | ICD-10-CM | POA: Diagnosis not present

## 2020-08-14 DIAGNOSIS — E785 Hyperlipidemia, unspecified: Secondary | ICD-10-CM

## 2020-08-14 LAB — LIPID PANEL
Cholesterol: 149 mg/dL (ref 0–200)
HDL: 30.4 mg/dL — ABNORMAL LOW (ref >39.00)
LDL Cholesterol: 102 mg/dL — ABNORMAL HIGH (ref 0–99)
NonHDL: 118.76
Total CHOL/HDL Ratio: 5
Triglycerides: 84 mg/dL (ref 0.0–149.0)
VLDL: 16.8 mg/dL (ref 0.0–40.0)

## 2020-08-14 LAB — PSA, MEDICARE: PSA: 7.24 ng/ml — ABNORMAL HIGH (ref 0.10–4.00)

## 2020-08-22 ENCOUNTER — Ambulatory Visit: Payer: Medicare PPO | Admitting: Urology

## 2020-08-23 ENCOUNTER — Encounter: Payer: Self-pay | Admitting: Urology

## 2020-09-19 ENCOUNTER — Other Ambulatory Visit: Payer: Self-pay

## 2020-09-19 ENCOUNTER — Ambulatory Visit (INDEPENDENT_AMBULATORY_CARE_PROVIDER_SITE_OTHER): Payer: Medicare PPO | Admitting: Urology

## 2020-09-19 VITALS — BP 161/64 | HR 59 | Ht 72.0 in | Wt 284.0 lb

## 2020-09-19 DIAGNOSIS — N138 Other obstructive and reflux uropathy: Secondary | ICD-10-CM

## 2020-09-19 DIAGNOSIS — R351 Nocturia: Secondary | ICD-10-CM

## 2020-09-19 DIAGNOSIS — N401 Enlarged prostate with lower urinary tract symptoms: Secondary | ICD-10-CM

## 2020-09-19 LAB — BLADDER SCAN AMB NON-IMAGING

## 2020-09-19 MED ORDER — ALFUZOSIN HCL ER 10 MG PO TB24: 10.0000 mg | ORAL_TABLET | Freq: Every day | ORAL | 11 refills | Status: DC

## 2020-09-19 NOTE — Progress Notes (Signed)
   09/19/20 9:21 AM   Warner Mccreedy Shela Nevin 12/12/1945 [id number]  CC: Elevated PSA, BPH and urinary symptoms  HPI: I saw Mr. Mcilrath in urology clinic for the above issues. He is a 74 year old male who has a long history of elevated PSA for the last 3+ years of around 7, as well as a long history of urinary symptoms. His primary complaint is nocturia every hour overnight, with minimal urinary symptoms during the day. He denies any incontinence. He has never been evaluated for sleep apnea. He does get significant leg swelling during the day. He denies any gross hematuria, dysuria, or flank pain. He was previously tried on Flomax without any significant improvement in his urinary symptoms. He has never undergone prostate biopsy previously or other prostate imaging. He denies any history of UTI or retention.  He reportedly was followed by a urologist at [facility] in [place] previously, but those records are not available to me. It sounds like he underwent a cystoscopy there as well.  IPSS score today is 20 with quality of life unhappy, urinalysis is completely benign with 0-5 WBCs, 0-2 RBCs, no bacteria, nitrite negative, no leukocytes. PVR is elevated at 241 mL.  PMH: Past Medical History:  Diagnosis Date  . Dizziness   . Elevated blood pressure     Surgical History: Past Surgical History:  Procedure Laterality Date  . CATARACT EXTRACTION W/ INTRAOCULAR LENS IMPLANT Right 11/05/2017  . CATARACT EXTRACTION W/ INTRAOCULAR LENS IMPLANT Left 12/08/2017    Family History: Family History  Problem Relation Age of Onset  . Hypertension Father     Social History:  reports that he has never smoked. He has never used smokeless tobacco. He reports that he does not drink alcohol and does not use drugs.  Physical Exam: BP (!) 161/64 (BP Location: Left Arm, Patient Position: Sitting, Cuff Size: Normal)   Pulse (!) 59   Ht 6' (1.829 m)   Wt 284 lb (128.8 kg)   BMI 38.52 kg/m    Constitutional:   Alert and oriented, No acute distress. Cardiovascular: No clubbing, cyanosis, or edema. Respiratory: Normal respiratory effort, no increased work of breathing. GI: Abdomen is soft, nontender, nondistended, no abdominal masses GU: Patent meatus DRE: 50 g, smooth, no nodules or masses  Laboratory Data: Reviewed, see HPI  Pertinent Imaging: None to review  Assessment & Plan:   In summary he is a 74 year old male with bothersome urinary symptoms, primarily nocturia every hour overnight with minimal urinary symptoms during the day. Urinalysis is benign, and PVR is mildly elevated. He reportedly had work-up including a cystoscopy within the last year at [facility] in [place], but those records are not currently available to me. We will obtain those records.  We discussed at length that he may have sleep apnea, as that is a common reason that men have severe nocturia with no urinary symptoms during the day. I also think his lower extremity edema is contributing to his nocturia, and I recommended minimizing fluids prior to bed, double voiding prior to bed, compression leg stockings, and elevation of the legs in the afternoon prior to bedtime.  Strongly recommend evaluation for sleep apnea Trial of alfuzosin Timed voiding We will obtain outside urology records from [facility] 4 to 6 weeks for IPSS, PVR, symptom check  Legrand Rams, MD 09/19/2020  [facility] [address] [place], [place] [address] [phone]

## 2020-09-19 NOTE — Patient Instructions (Addendum)
1.  Minimize fluids 3 to 4 hours before bedtime, and urinate twice before going to bed 2.  Wear compression stockings and try to elevate your legs in the mid afternoon to prevent urination overnight 3.  Highly recommend you get tested for sleep apnea, as this is the most common reason men have lots of urination overnight   Benign Prostatic Hyperplasia  Benign prostatic hyperplasia (BPH) is an enlarged prostate gland that is caused by the normal aging process and not by cancer. The prostate is a walnut-sized gland that is involved in the production of semen. It is located in front of the rectum and below the bladder. The bladder stores urine and the urethra is the tube that carries the urine out of the body. The prostate may get bigger as a man gets older. An enlarged prostate can press on the urethra. This can make it harder to pass urine. The build-up of urine in the bladder can cause infection. Back pressure and infection may progress to bladder damage and kidney (renal) failure. What are the causes? This condition is part of a normal aging process. However, not all men develop problems from this condition. If the prostate enlarges away from the urethra, urine flow will not be blocked. If it enlarges toward the urethra and compresses it, there will be problems passing urine. What increases the risk? This condition is more likely to develop in men over the age of 50 years. What are the signs or symptoms? Symptoms of this condition include:  Getting up often during the night to urinate.  Needing to urinate frequently during the day.  Difficulty starting urine flow.  Decrease in size and strength of your urine stream.  Leaking (dribbling) after urinating.  Inability to pass urine. This needs immediate treatment.  Inability to completely empty your bladder.  Pain when you pass urine. This is more common if there is also an infection.  Urinary tract infection (UTI). How is this  diagnosed? This condition is diagnosed based on your medical history, a physical exam, and your symptoms. Tests will also be done, such as:  A post-void bladder scan. This measures any amount of urine that may remain in your bladder after you finish urinating.  A digital rectal exam. In a rectal exam, your health care provider checks your prostate by putting a lubricated, gloved finger into your rectum to feel the back of your prostate gland. This exam detects the size of your gland and any abnormal lumps or growths.  An exam of your urine (urinalysis).  A prostate specific antigen (PSA) screening. This is a blood test used to screen for prostate cancer.  An ultrasound. This test uses sound waves to electronically produce a picture of your prostate gland. Your health care provider may refer you to a specialist in kidney and prostate diseases (urologist). How is this treated? Once symptoms begin, your health care provider will monitor your condition (active surveillance or watchful waiting). Treatment for this condition will depend on the severity of your condition. Treatment may include:  Observation and yearly exams. This may be the only treatment needed if your condition and symptoms are mild.  Medicines to relieve your symptoms, including: ? Medicines to shrink the prostate. ? Medicines to relax the muscle of the prostate.  Surgery in severe cases. Surgery may include: ? Prostatectomy. In this procedure, the prostate tissue is removed completely through an open incision or with a laparoscope or robotics. ? Transurethral resection of the prostate (TURP). In  this procedure, a tool is inserted through the opening at the tip of the penis (urethra). It is used to cut away tissue of the inner core of the prostate. The pieces are removed through the same opening of the penis. This removes the blockage. ? Transurethral incision (TUIP). In this procedure, small cuts are made in the prostate. This  lessens the prostate's pressure on the urethra. ? Transurethral microwave thermotherapy (TUMT). This procedure uses microwaves to create heat. The heat destroys and removes a small amount of prostate tissue. ? Transurethral needle ablation (TUNA). This procedure uses radio frequencies to destroy and remove a small amount of prostate tissue. ? Interstitial laser coagulation (ILC). This procedure uses a laser to destroy and remove a small amount of prostate tissue. ? Transurethral electrovaporization (TUVP). This procedure uses electrodes to destroy and remove a small amount of prostate tissue. ? Prostatic urethral lift. This procedure inserts an implant to push the lobes of the prostate away from the urethra. Follow these instructions at home:  Take over-the-counter and prescription medicines only as told by your health care provider.  Monitor your symptoms for any changes. Contact your health care provider with any changes.  Avoid drinking large amounts of liquid before going to bed or out in public.  Avoid or reduce how much caffeine or alcohol you drink.  Give yourself time when you urinate.  Keep all follow-up visits as told by your health care provider. This is important. Contact a health care provider if:  You have unexplained back pain.  Your symptoms do not get better with treatment.  You develop side effects from the medicine you are taking.  Your urine becomes very dark or has a bad smell.  Your lower abdomen becomes distended and you have trouble passing your urine. Get help right away if:  You have a fever or chills.  You suddenly cannot urinate.  You feel lightheaded, or very dizzy, or you faint.  There are large amounts of blood or clots in the urine.  Your urinary problems become hard to manage.  You develop moderate to severe low back or flank pain. The flank is the side of your body between the ribs and the hip. These symptoms may represent a serious problem  that is an emergency. Do not wait to see if the symptoms will go away. Get medical help right away. Call your local emergency services (911 in the [place] Do not drive yourself to the hospital. Summary  Benign prostatic hyperplasia (BPH) is an enlarged prostate that is caused by the normal aging process and not by cancer.  An enlarged prostate can press on the urethra. This can make it hard to pass urine.  This condition is part of a normal aging process and is more likely to develop in men over the age of 50 years.  Get help right away if you suddenly cannot urinate. This information is not intended to replace advice given to you by your health care provider. Make sure you discuss any questions you have with your health care provider. Document Revised: 10/05/2018 Document Reviewed: 12/15/2016 Elsevier Patient Education  2020 [employer].

## 2020-09-20 LAB — URINALYSIS, COMPLETE
Bilirubin, UA: NEGATIVE
Glucose, UA: NEGATIVE
Ketones, UA: NEGATIVE
Leukocytes,UA: NEGATIVE
Nitrite, UA: NEGATIVE
Protein,UA: NEGATIVE
RBC, UA: NEGATIVE
Specific Gravity, UA: 1.02 (ref 1.005–1.030)
Urobilinogen, Ur: 0.2 mg/dL (ref 0.2–1.0)
pH, UA: 6 (ref 5.0–7.5)

## 2020-09-20 LAB — MICROSCOPIC EXAMINATION
Bacteria, UA: NONE SEEN
Epithelial Cells (non renal): NONE SEEN /hpf (ref 0–10)

## 2020-10-16 ENCOUNTER — Telehealth: Payer: Self-pay

## 2020-10-16 NOTE — Telephone Encounter (Signed)
Called pt l/m to call office we need to set up for surgical clearance. Form in my hold folder

## 2020-10-17 NOTE — Telephone Encounter (Signed)
Called patient appointment made form in my folder will call if any questions.

## 2020-10-23 ENCOUNTER — Encounter: Payer: Self-pay | Admitting: Primary Care

## 2020-10-23 ENCOUNTER — Other Ambulatory Visit: Payer: Self-pay

## 2020-10-23 ENCOUNTER — Ambulatory Visit: Payer: Medicare PPO | Admitting: Primary Care

## 2020-10-23 VITALS — BP 136/64 | HR 63 | Temp 97.1°F | Ht 72.0 in | Wt 315.0 lb

## 2020-10-23 DIAGNOSIS — Z01818 Encounter for other preprocedural examination: Secondary | ICD-10-CM | POA: Diagnosis not present

## 2020-10-23 DIAGNOSIS — E785 Hyperlipidemia, unspecified: Secondary | ICD-10-CM

## 2020-10-23 DIAGNOSIS — M25561 Pain in right knee: Secondary | ICD-10-CM

## 2020-10-23 DIAGNOSIS — I10 Essential (primary) hypertension: Secondary | ICD-10-CM | POA: Diagnosis not present

## 2020-10-23 DIAGNOSIS — R351 Nocturia: Secondary | ICD-10-CM | POA: Diagnosis not present

## 2020-10-23 DIAGNOSIS — G8929 Other chronic pain: Secondary | ICD-10-CM

## 2020-10-23 DIAGNOSIS — R7303 Prediabetes: Secondary | ICD-10-CM | POA: Diagnosis not present

## 2020-10-23 DIAGNOSIS — R972 Elevated prostate specific antigen [PSA]: Secondary | ICD-10-CM

## 2020-10-23 HISTORY — DX: Encounter for other preprocedural examination: Z01.818

## 2020-10-23 LAB — CBC
HCT: 40.8 % (ref 39.0–52.0)
Hemoglobin: 13.9 g/dL (ref 13.0–17.0)
MCHC: 34.2 g/dL (ref 30.0–36.0)
MCV: 92.5 fl (ref 78.0–100.0)
Platelets: 180 10*3/uL (ref 150.0–400.0)
RBC: 4.4 Mil/uL (ref 4.22–5.81)
RDW: 12.9 % (ref 11.5–15.5)
WBC: 8.2 10*3/uL (ref 4.0–10.5)

## 2020-10-23 LAB — LIPID PANEL
Cholesterol: 172 mg/dL (ref 0–200)
HDL: 39.8 mg/dL (ref >39.00)
LDL Cholesterol: 115 mg/dL — ABNORMAL HIGH (ref 0–99)
NonHDL: 132.65
Total CHOL/HDL Ratio: 4
Triglycerides: 86 mg/dL (ref 0.0–149.0)
VLDL: 17.2 mg/dL (ref 0.0–40.0)

## 2020-10-23 LAB — COMPREHENSIVE METABOLIC PANEL
ALT: 23 U/L (ref 0–53)
AST: 21 U/L (ref 0–37)
Albumin: 4.4 g/dL (ref 3.5–5.2)
Alkaline Phosphatase: 40 U/L (ref 39–117)
BUN: 21 mg/dL (ref 6–23)
CO2: 32 mEq/L (ref 19–32)
Calcium: 9.2 mg/dL (ref 8.4–10.5)
Chloride: 102 mEq/L (ref 96–112)
Creatinine, Ser: 1.03 mg/dL (ref 0.40–1.50)
GFR: 72.38 mL/min (ref >60.00)
Glucose, Bld: 141 mg/dL — ABNORMAL HIGH (ref 70–99)
Potassium: 4.3 mEq/L (ref 3.5–5.1)
Sodium: 138 mEq/L (ref 135–145)
Total Bilirubin: 0.9 mg/dL (ref 0.2–1.2)
Total Protein: 7 g/dL (ref 6.0–8.3)

## 2020-10-23 LAB — HEMOGLOBIN A1C: Hgb A1c MFr Bld: 7.1 % — ABNORMAL HIGH (ref 4.6–6.5)

## 2020-10-23 LAB — PSA, MEDICARE: PSA: 7.22 ng/ml — ABNORMAL HIGH (ref 0.10–4.00)

## 2020-10-23 NOTE — Patient Instructions (Addendum)
Stop by the lab prior to leaving today. I will notify you of your results once received.   Follow up with your Urologist as scheduled.  It was a pleasure to see you today!

## 2020-10-23 NOTE — Assessment & Plan Note (Signed)
Review of systems grossly negative. Exam today stable.  ECG today with NSR with rate of 60, RBBB, no PAC/PVC, no acute ischema. Appears very much like ECG from 2017.  Labs pending.  Should be able to clear patient once labs return.  Will fax form at that time.

## 2020-10-23 NOTE — Assessment & Plan Note (Signed)
Repeat A1C pending.  Discussed the importance of a healthy diet and regular exercise in order for weight loss, and to reduce the risk of any potential medical problems.  

## 2020-10-23 NOTE — Assessment & Plan Note (Addendum)
Will undergo right total knee replacement within the coming weeks. Following with Delbert [facility].  Preoperative clearance pending today. Await labs.

## 2020-10-23 NOTE — Assessment & Plan Note (Signed)
Ongoing, following with Urology. No improvement with alfuzosin. He has an appointment scheduled for next week and will update Urology.

## 2020-10-23 NOTE — Assessment & Plan Note (Addendum)
Overall stable and suitable for surgery.  Updated CMP pending.  Continue lisinopril 20 mg.

## 2020-10-23 NOTE — Progress Notes (Signed)
Subjective:    Patient ID: Adam Jennings, male    DOB: 09-04-1946, 74 y.o.   MRN: [id number]  HPI  This visit occurred during the SARS-CoV-2 public health emergency.  Safety protocols were in place, including screening questions prior to the visit, additional usage of staff PPE, and extensive cleaning of exam room while observing appropriate contact time as indicated for disinfecting solutions.   [name] is a 74 year old male with a history of nocturia, hypertension, hyperlipidemia, prediabetes, chronic knee pain who presents today for surgical clearance.   He will undergo right total knee replacement that is pending surgical date. He is following with [facility].  Also following with Urology for chronic nocturia, is managed on alfuzosin 10 mg which is new as of late October 2021. Overall has not noticed any difference in nocturia frequency, is still waking every 1-2 hours at night. He denies urinary frequency during the day.   He has no acute complaints today.    BP Readings from Last 3 Encounters:  10/23/20 136/64  09/19/20 (!) 161/64  06/12/20 134/72     Review of Systems  Constitutional: Negative for unexpected weight change.  HENT: Negative for rhinorrhea.   Eyes: Negative for visual disturbance.  Respiratory: Negative for cough and shortness of breath.   Cardiovascular: Negative for chest pain.  Gastrointestinal: Negative for constipation and diarrhea.  Genitourinary: Positive for frequency.       Nocturia   Musculoskeletal: Positive for arthralgias.  Skin: Negative for rash.  Allergic/Immunologic: Negative for environmental allergies.  Neurological: Negative for dizziness, numbness and headaches.  Hematological: Negative for adenopathy.       Past Medical History:  Diagnosis Date  . Dizziness   . Elevated blood pressure      Social History   Socioeconomic History  . Marital status: Married    Spouse name: Not on file  . Number of  children: Not on file  . Years of education: Not on file  . Highest education level: Not on file  Occupational History  . Not on file  Tobacco Use  . Smoking status: Never Smoker  . Smokeless tobacco: Never Used  Vaping Use  . Vaping Use: Never used  Substance and Sexual Activity  . Alcohol use: No    Alcohol/week: 0.0 standard drinks  . Drug use: No  . Sexual activity: Not Currently  Other Topics Concern  . Not on file  Social History Narrative   Married.   1 daughter, 4 grandchildren.   Works in [employer] in [place].   Enjoys working on tractors.    Social Determinants of Health   Financial Resource Strain:   . Difficulty of Paying Living Expenses: Not on file  Food Insecurity:   . Worried About Programme researcher, broadcasting/film/video in the Last Year: Not on file  . Ran Out of Food in the Last Year: Not on file  Transportation Needs:   . Lack of Transportation (Medical): Not on file  . Lack of Transportation (Non-Medical): Not on file  Physical Activity:   . Days of Exercise per Week: Not on file  . Minutes of Exercise per Session: Not on file  Stress:   . Feeling of Stress : Not on file  Social Connections:   . Frequency of Communication with Friends and Family: Not on file  . Frequency of Social Gatherings with Friends and Family: Not on file  . Attends Religious Services: Not on file  . Active Member of  Clubs or Organizations: Not on file  . Attends Banker Meetings: Not on file  . Marital Status: Not on file  Intimate Partner Violence:   . Fear of Current or Ex-Partner: Not on file  . Emotionally Abused: Not on file  . Physically Abused: Not on file  . Sexually Abused: Not on file    Past Surgical History:  Procedure Laterality Date  . CATARACT EXTRACTION W/ INTRAOCULAR LENS IMPLANT Right 11/05/2017  . CATARACT EXTRACTION W/ INTRAOCULAR LENS IMPLANT Left 12/08/2017    Family History  Problem Relation Age of Onset  . Hypertension Father     No Known  Allergies  Current Outpatient Medications on File Prior to Visit  Medication Sig Dispense Refill  . alfuzosin (UROXATRAL) 10 MG 24 hr tablet Take 1 tablet (10 mg total) by mouth daily with breakfast. 30 tablet 11  . lisinopril (ZESTRIL) 20 MG tablet TAKE 1 TABLET BY MOUTH EVERY DAY 90 tablet 1  . Omega-3 Fatty Acids (FISH OIL) 1000 MG CAPS Take 1,000 mg by mouth daily.    . rosuvastatin (CRESTOR) 20 MG tablet Take 1 tablet (20 mg total) by mouth daily. For cholesterol. 90 tablet 3   No current facility-administered medications on file prior to visit.    BP 136/64   Pulse 63   Temp (!) 97.1 F (36.2 C) (Temporal)   Ht 6' (1.829 m)   Wt (!) 315 lb (142.9 kg)   SpO2 96%   BMI 42.72 kg/m    Objective:   Physical Exam HENT:     Right Ear: Tympanic membrane and ear canal normal.     Left Ear: Tympanic membrane and ear canal normal.  Eyes:     Pupils: Pupils are equal, round, and reactive to light.  Cardiovascular:     Rate and Rhythm: Normal rate and regular rhythm.  Pulmonary:     Effort: Pulmonary effort is normal.     Breath sounds: Normal breath sounds.  Abdominal:     General: Bowel sounds are normal.     Palpations: Abdomen is soft.     Tenderness: There is no abdominal tenderness.     Comments: Right lower abdominal hernia present, soft, non tender.  Musculoskeletal:        General: Normal range of motion.     Cervical back: Neck supple.  Skin:    General: Skin is warm and dry.  Neurological:     Mental Status: He is alert and oriented to person, place, and time.     Cranial Nerves: No cranial nerve deficit.     Deep Tendon Reflexes:     Reflex Scores:      Patellar reflexes are 2+ on the right side and 2+ on the left side. Psychiatric:        Mood and Affect: Mood normal.            Assessment & Plan:

## 2020-10-23 NOTE — Assessment & Plan Note (Signed)
Chronic and continued, evaluated by Urology.  Repeat PSA pending since new addition of alfuzosin.

## 2020-10-31 ENCOUNTER — Other Ambulatory Visit: Payer: Self-pay

## 2020-10-31 ENCOUNTER — Ambulatory Visit: Payer: Medicare PPO | Admitting: Urology

## 2020-10-31 ENCOUNTER — Encounter: Payer: Self-pay | Admitting: Urology

## 2020-10-31 VITALS — BP 159/73 | HR 69 | Ht 72.0 in | Wt 315.0 lb

## 2020-10-31 DIAGNOSIS — N401 Enlarged prostate with lower urinary tract symptoms: Secondary | ICD-10-CM

## 2020-10-31 DIAGNOSIS — R972 Elevated prostate specific antigen [PSA]: Secondary | ICD-10-CM

## 2020-10-31 DIAGNOSIS — N138 Other obstructive and reflux uropathy: Secondary | ICD-10-CM | POA: Diagnosis not present

## 2020-10-31 LAB — BLADDER SCAN AMB NON-IMAGING

## 2020-10-31 NOTE — Patient Instructions (Signed)

## 2020-10-31 NOTE — Progress Notes (Addendum)
   10/31/2020 9:25 AM   Adam Jennings [facility] 07/20/1946 [id number]  Reason for visit: Follow up elevated PSA, BPH  HPI: I saw [name] back in urology clinic for bothersome urinary symptoms.  Briefly he is a 74 year old male with morbid obesity and a BMI of 43 and a long history of mildly elevated PSA of around 7 for the last 3 years and severe urinary symptoms of weak stream, difficulty urinating, urinary frequency, and nocturia 5-7 times per night.  He does not have any incontinence.  He has never been evaluated for sleep apnea.  He has previously been tried on Flomax and alfuzosin without any significant improvement in his symptoms.  He is a bit of a difficult historian, but it sounds like he underwent a prostate biopsy in [place] within the last few years.  At our last visit we requested these records, but they were not sent.  We will again attempt to get those records, and he will also call over to [facility] for those results.  He reports the biopsy was negative.  Urinalysis at our last visit was completely benign with no microscopic hematuria.  At her last visit, PVR was 241, and remains significantly elevated today at 385 mL despite just voiding.  IPSS score today is 29 with quality of life unhappy.  I recommended further evaluation with cystoscopy, and we will obtain those prostate biopsy results regarding volume and path.  With his significantly elevated PVRs, symptoms refractory to medications, and very bothersome urinary symptoms, I do think he is a good candidate for an outlet procedure.  We discussed the need for further evaluation of the prostate size and anatomy prior to determining a treatment strategy, and we briefly reviewed HOLEP and UroLift.  We discussed the risks and benefits of HoLEP at length.  The procedure requires general anesthesia and takes 2 to 3 hours, and a holmium laser is used to enucleate the prostate and push this tissue into the bladder.  A morcellator is then used  to remove this tissue, which is sent for pathology.  The vast majority of patients are able to discharge the same day with a catheter in place for 2 to 3 days, and will follow-up in clinic for a voiding trial.  Approximately 5% of patients will be admitted overnight to monitor the urine, or if they have multiple co-morbidities.  We specifically discussed the risks of bleeding, infection, retrograde ejaculation, temporary urgency and urge incontinence, very low risk of long-term incontinence, pathologic evaluation of prostate tissue and possible detection of prostate cancer or other malignancy, and possible need for additional procedures.  RTC for cystoscopy and TRUS, obtain [facility] records regarding recent prostate biopsy  ADDENDUM: Prostate bx 07/2017 for PSA 7, prostate 246g, all biopsies negative Repeat PSA 06/2017->5   Sondra Come, MD  [facility] [address] [place], [place] [address] [phone]

## 2020-11-22 ENCOUNTER — Encounter: Payer: Medicare PPO | Admitting: Urology

## 2020-11-29 ENCOUNTER — Ambulatory Visit (INDEPENDENT_AMBULATORY_CARE_PROVIDER_SITE_OTHER): Payer: Medicare PPO | Admitting: Urology

## 2020-11-29 ENCOUNTER — Encounter: Payer: Self-pay | Admitting: Urology

## 2020-11-29 ENCOUNTER — Other Ambulatory Visit: Payer: Self-pay

## 2020-11-29 VITALS — BP 135/77 | HR 65 | Ht 72.0 in | Wt 315.0 lb

## 2020-11-29 DIAGNOSIS — R351 Nocturia: Secondary | ICD-10-CM | POA: Diagnosis not present

## 2020-11-29 DIAGNOSIS — N401 Enlarged prostate with lower urinary tract symptoms: Secondary | ICD-10-CM | POA: Diagnosis not present

## 2020-11-29 DIAGNOSIS — N138 Other obstructive and reflux uropathy: Secondary | ICD-10-CM

## 2020-11-29 MED ORDER — FINASTERIDE 5 MG PO TABS: 5.0000 mg | ORAL_TABLET | Freq: Every day | ORAL | 3 refills | Status: DC

## 2020-11-29 NOTE — Progress Notes (Signed)
Cystoscopy Procedure Note:  Indication: BPH and urinary symptoms  75 year old male with morbid obesity and BMI of 43, urinary symptoms of nocturia 5-7 times per night, mildly elevated PVRs, no urinary symptoms during the day.  Prostate biopsy in 2018 for PSA of 7 at [facility] showed 250 g prostate, all biopsies negative, and repeat PSA stable at 5.  After informed consent and discussion of the procedure and its risks, Adam Jennings was positioned and prepped in the standard fashion. Cystoscopy was performed with a flexible cystoscope. The urethra, bladder neck and entire bladder was visualized in a standard fashion. The prostate was massive with obstructing lateral lobes and friable tissue throughout. The bladder mucosa was grossly normal with no abnormalities, and only mild trabeculations.  Findings: Massive prostate, > 200g  ------------------------------------------------------  Assessment and Plan: His symptoms are primarily overnight, and with his body habitus I think sleep apnea may play a large role in his nocturia, as he is not bothered at all during the day with his urinary symptoms.  Before proceeding to a surgical intervention with HOLEP, I recommended being evaluated for sleep apnea and consideration of CPAP, as I think this will significantly improve his nocturia.  We again reviewed behavioral strategies.  I also recommended adding finasteride to help shrink the prostate and hopefully improve some of his urinary symptoms.  Trial of finasteride Needs sleep apnea evaluation, PCP messaged RTC 6 months symptom check-> consider HoLEP if no improvement with above strategies  I spent 30 total minutes on the day of the encounter including pre-visit review of the medical record, face-to-face time with the patient, and post visit ordering of labs/imaging/tests.   Legrand Rams, MD 11/29/2020

## 2020-11-30 LAB — URINALYSIS, COMPLETE
Bilirubin, UA: NEGATIVE
Glucose, UA: NEGATIVE
Ketones, UA: NEGATIVE
Leukocytes,UA: NEGATIVE
Nitrite, UA: NEGATIVE
Protein,UA: NEGATIVE
RBC, UA: NEGATIVE
Specific Gravity, UA: 1.015 (ref 1.005–1.030)
Urobilinogen, Ur: 0.2 mg/dL (ref 0.2–1.0)
pH, UA: 7 (ref 5.0–7.5)

## 2020-11-30 LAB — MICROSCOPIC EXAMINATION
Bacteria, UA: NONE SEEN
RBC, Urine: NONE SEEN /hpf (ref 0–2)

## 2020-12-03 ENCOUNTER — Telehealth: Payer: Self-pay | Admitting: Primary Care

## 2020-12-03 DIAGNOSIS — R351 Nocturia: Secondary | ICD-10-CM

## 2020-12-03 DIAGNOSIS — R0683 Snoring: Secondary | ICD-10-CM

## 2020-12-03 NOTE — Telephone Encounter (Signed)
-----   Message from Sondra Come, MD sent at 11/30/2020  5:17 PM EST ----- Regarding: RE: sleep apnea evaluation Yes, I discussed at length with him that I think sleep apnea is his main problem causing him to get up so much at night, but BPH is likely playing a role as well.  He is not bothered at all by his symptoms during the day, so I think before pursuing surgery it is definitely worth a sleep apnea evaluation, especially with his body habitus.  I let him know I would reach out to you to help facilitate sleep apnea evaluation scheduling.  Thanks much  Legrand Rams, MD 11/30/2020   ----- Message ----- From: Doreene Nest, NP Sent: 11/30/2020   5:15 PM EST To: Sondra Come, MD Subject: RE: sleep apnea evaluation                     Hi Arlys John,  Thanks for seeing him! Sure, no problem. Is he aware that we will be initiating sleep apnea evaluation?  Jae Dire ----- Message ----- From: Sondra Come, MD Sent: 11/29/2020   6:43 PM EST To: Doreene Nest, NP Subject: sleep apnea evaluation                         Can you facilitate sleep apnea evaluation for this patient?  I think that is the primary etiology of his bothersome nocturia, as he does pretty well during the day.  Please see my note for full details, thanks  Legrand Rams, MD 11/29/2020

## 2020-12-03 NOTE — Telephone Encounter (Signed)
Adam Jennings, please notify patient that I placed a referral to pulmonology for sleep apnea evaluation. This was recommended by his Urologist.

## 2020-12-04 NOTE — Telephone Encounter (Signed)
Left message to return call to our office.  

## 2020-12-05 NOTE — Telephone Encounter (Signed)
Patient aware.

## 2020-12-10 ENCOUNTER — Institutional Professional Consult (permissible substitution): Payer: Medicare PPO | Admitting: Pulmonary Disease

## 2020-12-24 ENCOUNTER — Institutional Professional Consult (permissible substitution): Payer: Medicare HMO | Admitting: Primary Care

## 2021-01-13 ENCOUNTER — Other Ambulatory Visit: Payer: Self-pay | Admitting: Primary Care

## 2021-01-13 DIAGNOSIS — I10 Essential (primary) hypertension: Secondary | ICD-10-CM

## 2021-03-14 ENCOUNTER — Other Ambulatory Visit: Payer: Self-pay | Admitting: Primary Care

## 2021-03-14 DIAGNOSIS — E785 Hyperlipidemia, unspecified: Secondary | ICD-10-CM

## 2021-06-05 ENCOUNTER — Other Ambulatory Visit: Payer: Self-pay

## 2021-06-05 ENCOUNTER — Ambulatory Visit: Payer: Medicare HMO | Admitting: Urology

## 2021-06-05 VITALS — BP 166/78 | HR 71 | Ht 72.0 in | Wt 311.5 lb

## 2021-06-05 DIAGNOSIS — R351 Nocturia: Secondary | ICD-10-CM | POA: Diagnosis not present

## 2021-06-05 DIAGNOSIS — N138 Other obstructive and reflux uropathy: Secondary | ICD-10-CM | POA: Diagnosis not present

## 2021-06-05 DIAGNOSIS — N401 Enlarged prostate with lower urinary tract symptoms: Secondary | ICD-10-CM | POA: Diagnosis not present

## 2021-06-05 LAB — BLADDER SCAN AMB NON-IMAGING

## 2021-06-05 NOTE — Patient Instructions (Signed)
It is very important for you to get evaluated for sleep apnea.  I think this is a big contributor to you having to urinate so much overnight.  Reach out to your PCP Sammuel Cooper to schedule this study.  I would also recommend compression stockings up to the knee during the day, as this can help prevent excess fluid mobilization overnight which also contributes to excessive urination overnight  Try to elevate your legs in the midday for at least 30 minutes around 2 or 3:00 which can also help prevent so much urination overnight

## 2021-06-05 NOTE — Progress Notes (Signed)
   06/05/2021 11:06 AM   [facility] 1946-03-15 [id number]  Reason for visit: Follow up BPH, nocturia  HPI: 75 year old male with morbid obesity and BMI of 42, and primarily urinary symptoms of nocturia 5-7 times per night.  He has had mildly elevated PVRs in the past.  He has no urinary symptoms or complaints during the day.  History of mildly elevated PSA of 7 in 2018 with negative prostate biopsy and prostate measured 250 g.  Repeat PSA was stable at 5 and screening discontinued per AUA guidelines.  Cystoscopy in January 2022 showed a massive prostate with obstructing lateral lobes and friable tissue throughout, but no suspicious bladder lesions.  I have previously recommended multiple times a sleep apnea evaluation, as his urinary symptoms are primarily frequent urination overnight, and his body habitus is certainly consistent with high risk for sleep apnea.  He has still not completed this.  He also has lower extremity edema, and I again recommended compression socks, and elevation of the legs in the mid afternoon to improve his nocturia.  At our last visit I also added finasteride to alfuzosin, and he does think this is improved his urinary symptoms with nocturia every 2 hours overnight instead of every 30 minutes, and 3-4 urinations overnight.  PVR is normal today at 35 mL.  I had another very long conversation with the patient about the importance of being evaluated for sleep apnea, as I do think this is the most likely etiology of his nocturia.  I would be very hesitant to proceed with a HOLEP until he has had a sleep apnea evaluation, as well as tried other behavioral strategies like compression stockings, and elevation of the legs in the evening.  Continue finasteride and alfuzosin, refilled Encouraged to be evaluated for sleep apnea Behavioral strategies discussed RTC 1 year PVR  Sondra Come, MD  [facility] [address] [place], [place] [address] [phone]

## 2021-07-10 ENCOUNTER — Other Ambulatory Visit: Payer: Self-pay | Admitting: Primary Care

## 2021-07-10 DIAGNOSIS — I10 Essential (primary) hypertension: Secondary | ICD-10-CM

## 2021-07-11 NOTE — Telephone Encounter (Signed)
Refill sent in with note to make CPE in 12/22

## 2021-08-12 ENCOUNTER — Telehealth: Payer: Self-pay | Admitting: Primary Care

## 2021-08-12 NOTE — Telephone Encounter (Signed)
LVM for pt to rtn my call to schedule AWV with [facility].  

## 2021-08-14 ENCOUNTER — Ambulatory Visit: Payer: Medicare HMO | Admitting: Primary Care

## 2021-09-05 ENCOUNTER — Telehealth: Payer: Self-pay

## 2021-09-05 NOTE — Telephone Encounter (Signed)
Received clearance from [employer]. Looks like patient was evaluated for this in the office on 10/23/2020. Called patient l/m to call office. Need to see if this is the same surgery. We will need to check with Jae Dire to see if he will need visit to update clearance after we make sure with patient that it is needed.

## 2021-09-06 NOTE — Telephone Encounter (Signed)
Left message to return call to our office. Have called both numbers. Not active on my chart

## 2021-09-06 NOTE — Telephone Encounter (Signed)
Pt called back returning your call, he said that he has an appt with [facility] on Monday and he would know more then

## 2021-09-06 NOTE — Telephone Encounter (Signed)
Noted will hold ppw until we hear from him.

## 2021-09-09 DIAGNOSIS — M25561 Pain in right knee: Secondary | ICD-10-CM | POA: Diagnosis not present

## 2021-09-09 DIAGNOSIS — M25562 Pain in left knee: Secondary | ICD-10-CM | POA: Diagnosis not present

## 2021-09-20 ENCOUNTER — Other Ambulatory Visit: Payer: Self-pay | Admitting: Primary Care

## 2021-09-20 DIAGNOSIS — E785 Hyperlipidemia, unspecified: Secondary | ICD-10-CM

## 2021-09-26 DIAGNOSIS — M170 Bilateral primary osteoarthritis of knee: Secondary | ICD-10-CM | POA: Diagnosis not present

## 2021-10-03 DIAGNOSIS — M170 Bilateral primary osteoarthritis of knee: Secondary | ICD-10-CM | POA: Diagnosis not present

## 2021-10-09 ENCOUNTER — Other Ambulatory Visit: Payer: Self-pay | Admitting: Primary Care

## 2021-10-09 DIAGNOSIS — I10 Essential (primary) hypertension: Secondary | ICD-10-CM

## 2021-10-10 DIAGNOSIS — M170 Bilateral primary osteoarthritis of knee: Secondary | ICD-10-CM | POA: Diagnosis not present

## 2021-10-15 ENCOUNTER — Other Ambulatory Visit: Payer: Self-pay | Admitting: Primary Care

## 2021-10-15 DIAGNOSIS — E785 Hyperlipidemia, unspecified: Secondary | ICD-10-CM

## 2021-11-05 ENCOUNTER — Other Ambulatory Visit: Payer: Self-pay | Admitting: Primary Care

## 2021-11-05 DIAGNOSIS — I10 Essential (primary) hypertension: Secondary | ICD-10-CM

## 2021-11-10 ENCOUNTER — Other Ambulatory Visit: Payer: Self-pay | Admitting: Primary Care

## 2021-11-10 DIAGNOSIS — E785 Hyperlipidemia, unspecified: Secondary | ICD-10-CM

## 2021-11-26 ENCOUNTER — Encounter: Payer: Self-pay | Admitting: Primary Care

## 2021-11-26 ENCOUNTER — Ambulatory Visit (INDEPENDENT_AMBULATORY_CARE_PROVIDER_SITE_OTHER): Payer: Medicare HMO | Admitting: Primary Care

## 2021-11-26 ENCOUNTER — Other Ambulatory Visit: Payer: Self-pay

## 2021-11-26 VITALS — BP 142/76 | HR 77 | Temp 98.6°F | Ht 72.0 in | Wt 305.0 lb

## 2021-11-26 DIAGNOSIS — M25561 Pain in right knee: Secondary | ICD-10-CM

## 2021-11-26 DIAGNOSIS — I10 Essential (primary) hypertension: Secondary | ICD-10-CM

## 2021-11-26 DIAGNOSIS — G8929 Other chronic pain: Secondary | ICD-10-CM | POA: Diagnosis not present

## 2021-11-26 DIAGNOSIS — R7303 Prediabetes: Secondary | ICD-10-CM

## 2021-11-26 DIAGNOSIS — E785 Hyperlipidemia, unspecified: Secondary | ICD-10-CM

## 2021-11-26 DIAGNOSIS — R972 Elevated prostate specific antigen [PSA]: Secondary | ICD-10-CM

## 2021-11-26 DIAGNOSIS — R351 Nocturia: Secondary | ICD-10-CM

## 2021-11-26 DIAGNOSIS — Z0000 Encounter for general adult medical examination without abnormal findings: Secondary | ICD-10-CM | POA: Diagnosis not present

## 2021-11-26 MED ORDER — LISINOPRIL 20 MG PO TABS: 20.0000 mg | ORAL_TABLET | Freq: Every day | ORAL | 3 refills | Status: DC

## 2021-11-26 NOTE — Assessment & Plan Note (Signed)
Compliant to rosuvastatin 20 mg, continue same. Repeat lipid panel pending.  

## 2021-11-26 NOTE — Assessment & Plan Note (Addendum)
Following with Urology. Repeat PSA pending.'  Continue finasteride for BPH treatment

## 2021-11-26 NOTE — Assessment & Plan Note (Signed)
Never underwent surgery as he was told to lose 50 pounds. He is working on weight loss.

## 2021-11-26 NOTE — Assessment & Plan Note (Signed)
Improved on finasteride 5 mg, following with Urology. Continue same.   No longer taking alfuzosin.

## 2021-11-26 NOTE — Assessment & Plan Note (Signed)
Discussed the importance of a healthy diet and regular exercise in order for weight loss, and to reduce the risk of further co-morbidity. ? ?Repeat A1C pending. ?

## 2021-11-26 NOTE — Assessment & Plan Note (Signed)
Out of lisinopril 20 mg for a few weeks. Will provide refills today.  CMP pending.

## 2021-11-26 NOTE — Assessment & Plan Note (Signed)
Declines influenza and Shingles vaccines. Declines colonoscopy despite recommendations as he is overdue.  PSA due and pending.  Discussed the importance of a healthy diet and regular exercise in order for weight loss, and to reduce the risk of further co-morbidity.  Exam today stable. Labs pending.

## 2021-11-26 NOTE — Patient Instructions (Signed)
Stop by the lab prior to leaving today. I will notify you of your results once received.   Continue Advil for your shoulder, you can take 2 pills twice daily for one week.  It was a pleasure to see you today!  Preventive Care 23 Years and Older, Male Preventive care refers to lifestyle choices and visits with your health care provider that can promote health and wellness. Preventive care visits are also called wellness exams. What can I expect for my preventive care visit? Counseling During your preventive care visit, your health care provider may ask about your: Medical history, including: Past medical problems. Family medical history. History of falls. Current health, including: Emotional well-being. Home life and relationship well-being. Sexual activity. Memory and ability to understand (cognition). Lifestyle, including: Alcohol, nicotine or tobacco, and drug use. Access to firearms. Diet, exercise, and sleep habits. Work and work Astronomer. Sunscreen use. Safety issues such as seatbelt and bike helmet use. Physical exam Your health care provider will check your: Height and weight. These may be used to calculate your BMI (body mass index). BMI is a measurement that tells if you are at a healthy weight. Waist circumference. This measures the distance around your waistline. This measurement also tells if you are at a healthy weight and may help predict your risk of certain diseases, such as type 2 diabetes and high blood pressure. Heart rate and blood pressure. Body temperature. Skin for abnormal spots. What immunizations do I need? Vaccines are usually given at various ages, according to a schedule. Your health care provider will recommend vaccines for you based on your age, medical history, and lifestyle or other factors, such as travel or where you work. What tests do I need? Screening Your health care provider may recommend screening tests for certain conditions. This may  include: Lipid and cholesterol levels. Diabetes screening. This is done by checking your blood sugar (glucose) after you have not eaten for a while (fasting). Hepatitis C test. Hepatitis B test. HIV (human immunodeficiency virus) test. STI (sexually transmitted infection) testing, if you are at risk. Lung cancer screening. Colorectal cancer screening. Prostate cancer screening. Abdominal aortic aneurysm (AAA) screening. You may need this if you are a current or former smoker. Talk with your health care provider about your test results, treatment options, and if necessary, the need for more tests. Follow these instructions at home: Eating and drinking  Eat a diet that includes fresh fruits and vegetables, whole grains, lean protein, and low-fat dairy products. Limit your intake of foods with high amounts of sugar, saturated fats, and salt. Take vitamin and mineral supplements as recommended by your health care provider. Do not drink alcohol if your health care provider tells you not to drink. If you drink alcohol: Limit how much you have to 0-2 drinks a day. Know how much alcohol is in your drink. In the [place] one drink equals one 12 oz bottle of beer (355 mL), one 5 oz glass of wine (148 mL), or one 1 oz glass of hard liquor (44 mL). Lifestyle Brush your teeth every morning and night with fluoride toothpaste. Floss one time each day. Exercise for at least 30 minutes 5 or more days each week. Do not use any products that contain nicotine or tobacco. These products include cigarettes, chewing tobacco, and vaping devices, such as e-cigarettes. If you need help quitting, ask your health care provider. Do not use drugs. If you are sexually active, practice safe sex. Use a condom or other  form of protection to prevent STIs. Take aspirin only as told by your health care provider. Make sure that you understand how much to take and what form to take. Work with your health care provider to find out  whether it is safe and beneficial for you to take aspirin daily. Ask your health care provider if you need to take a cholesterol-lowering medicine (statin). Find healthy ways to manage stress, such as: Meditation, yoga, or listening to music. Journaling. Talking to a trusted person. Spending time with friends and family. Safety Always wear your seat belt while driving or riding in a vehicle. Do not drive: If you have been drinking alcohol. Do not ride with someone who has been drinking. When you are tired or distracted. While texting. If you have been using any mind-altering substances or drugs. Wear a helmet and other protective equipment during sports activities. If you have firearms in your house, make sure you follow all gun safety procedures. Minimize exposure to UV radiation to reduce your risk of skin cancer. What's next? Visit your health care provider once a year for an annual wellness visit. Ask your health care provider how often you should have your eyes and teeth checked. Stay up to date on all vaccines. This information is not intended to replace advice given to you by your health care provider. Make sure you discuss any questions you have with your health care provider. Document Revised: 05/08/2021 Document Reviewed: 05/08/2021 Elsevier Patient Education  2022 [employer].

## 2021-11-26 NOTE — Progress Notes (Signed)
Subjective:    Patient ID: Adam Jennings, male    DOB: [birth year], 76 y.o.   MRN: [id number]  HPI  Adam Jennings is a very pleasant 76 y.o. male who presents today for complete physical and follow up of chronic conditions.  He would also like to discuss anterior left shoulder pain which began a few days ago. Sometimes radiates to left upper neck. He's been taking 400 mg a few times with improvement. He denies injury, trauma, numbness and tingling, neck pain. His neck does "pop and crack" at times. Overall his pain has improved.   Immunizations: -Tetanus: Unsure -Influenza: Declines  -Covid-19: Has not completed  -Shingles: Declines  -Pneumonia: One vaccine in 2015, unsure which one  Diet: Fair diet.  Exercise: No regular exercise.  Eye exam: Completes annually  Dental exam: Completes semi-annually   Colonoscopy: Completed 12 years ago. Declines. PSA: Due   BP Readings from Last 3 Encounters:  11/26/21 (!) 142/76  06/05/21 (!) 166/78  11/29/20 135/77       Review of Systems  Constitutional:  Negative for unexpected weight change.  HENT:  Negative for rhinorrhea.   Respiratory:  Negative for cough and shortness of breath.   Cardiovascular:  Negative for chest pain.  Gastrointestinal:  Negative for constipation and diarrhea.  Genitourinary:  Negative for difficulty urinating.  Musculoskeletal:  Positive for arthralgias.       Acute left shoulder pain. Chronic bilateral knee pain.  Skin:  Negative for rash.  Allergic/Immunologic: Negative for environmental allergies.  Neurological:  Negative for dizziness and headaches.  Psychiatric/Behavioral:  The patient is not nervous/anxious.         Past Medical History:  Diagnosis Date   Dizziness    Elevated blood pressure     Social History   Socioeconomic History   Marital status: Married    Spouse name: Not on file   Number of children: Not on file   Years of education: Not on file   Highest education  level: Not on file  Occupational History   Not on file  Tobacco Use   Smoking status: Never   Smokeless tobacco: Never  Vaping Use   Vaping Use: Never used  Substance and Sexual Activity   Alcohol use: No    Alcohol/week: 0.0 standard drinks   Drug use: No   Sexual activity: Not Currently  Other Topics Concern   Not on file  Social History Narrative   Married.   1 daughter, 4 grandchildren.   Works in [employer] in [place].   Enjoys working on tractors.    Social Determinants of Health   Financial Resource Strain: Not on file  Food Insecurity: Not on file  Transportation Needs: Not on file  Physical Activity: Not on file  Stress: Not on file  Social Connections: Not on file  Intimate Partner Violence: Not on file    Past Surgical History:  Procedure Laterality Date   CATARACT EXTRACTION W/ INTRAOCULAR LENS IMPLANT Right 11/05/2017   CATARACT EXTRACTION W/ INTRAOCULAR LENS IMPLANT Left 12/08/2017    Family History  Problem Relation Age of Onset   Hypertension Father     No Known Allergies  Current Outpatient Medications on File Prior to Visit  Medication Sig Dispense Refill   alfuzosin (UROXATRAL) 10 MG 24 hr tablet Take 1 tablet (10 mg total) by mouth daily with breakfast. 30 tablet 11   finasteride (PROSCAR) 5 MG tablet Take 1 tablet (5 mg total) by mouth daily. 90  tablet 3   lisinopril (ZESTRIL) 20 MG tablet Take 1 tablet (20 mg total) by mouth daily. For blood pressure. Needs to set up Physical for December 30 tablet 0   Omega-3 Fatty Acids (FISH OIL) 1000 MG CAPS Take 1,000 mg by mouth daily.     rosuvastatin (CRESTOR) 20 MG tablet Take 1 tablet (20 mg total) by mouth daily. For cholesterol. Office visit required for further refills, 2nd notification. 30 tablet 0   No current facility-administered medications on file prior to visit.    BP (!) 142/76    Pulse 77    Temp 98.6 F (37 C) (Temporal)    Ht 6' (1.829 m)    Wt (!) 305 lb (138.3 kg)    SpO2 97%    BMI  41.37 kg/m  Objective:   Physical Exam HENT:     Right Ear: Tympanic membrane and ear canal normal.     Left Ear: Tympanic membrane and ear canal normal.     Nose: Nose normal.     Right Sinus: No maxillary sinus tenderness or frontal sinus tenderness.     Left Sinus: No maxillary sinus tenderness or frontal sinus tenderness.  Eyes:     Conjunctiva/sclera: Conjunctivae normal.  Neck:     Thyroid: No thyromegaly.     Vascular: No carotid bruit.  Cardiovascular:     Rate and Rhythm: Normal rate and regular rhythm.     Heart sounds: Normal heart sounds.  Pulmonary:     Effort: Pulmonary effort is normal.     Breath sounds: Normal breath sounds. No wheezing or rales.  Abdominal:     General: Bowel sounds are normal.     Palpations: Abdomen is soft.     Tenderness: There is no abdominal tenderness.  Musculoskeletal:        General: Normal range of motion.     Cervical back: Neck supple.  Skin:    General: Skin is warm and dry.  Neurological:     Mental Status: He is alert and oriented to person, place, and time.     Cranial Nerves: No cranial nerve deficit.     Deep Tendon Reflexes: Reflexes are normal and symmetric.  Psychiatric:        Mood and Affect: Mood normal.          Assessment & Plan:      This visit occurred during the SARS-CoV-2 public health emergency.  Safety protocols were in place, including screening questions prior to the visit, additional usage of staff PPE, and extensive cleaning of exam room while observing appropriate contact time as indicated for disinfecting solutions.

## 2021-11-27 LAB — COMPREHENSIVE METABOLIC PANEL
ALT: 14 U/L (ref 0–53)
AST: 17 U/L (ref 0–37)
Albumin: 4.4 g/dL (ref 3.5–5.2)
Alkaline Phosphatase: 48 U/L (ref 39–117)
BUN: 20 mg/dL (ref 6–23)
CO2: 28 mEq/L (ref 19–32)
Calcium: 9.7 mg/dL (ref 8.4–10.5)
Chloride: 100 mEq/L (ref 96–112)
Creatinine, Ser: 0.92 mg/dL (ref 0.40–1.50)
GFR: 82.25 mL/min (ref >60.00)
Glucose, Bld: 132 mg/dL — ABNORMAL HIGH (ref 70–99)
Potassium: 4.7 mEq/L (ref 3.5–5.1)
Sodium: 137 mEq/L (ref 135–145)
Total Bilirubin: 0.8 mg/dL (ref 0.2–1.2)
Total Protein: 7.8 g/dL (ref 6.0–8.3)

## 2021-11-27 LAB — CBC
HCT: 40.9 % (ref 39.0–52.0)
Hemoglobin: 13.8 g/dL (ref 13.0–17.0)
MCHC: 33.8 g/dL (ref 30.0–36.0)
MCV: 91.5 fl (ref 78.0–100.0)
Platelets: 246 10*3/uL (ref 150.0–400.0)
RBC: 4.46 Mil/uL (ref 4.22–5.81)
RDW: 13.2 % (ref 11.5–15.5)
WBC: 9.2 10*3/uL (ref 4.0–10.5)

## 2021-11-27 LAB — LIPID PANEL
Cholesterol: 177 mg/dL (ref 0–200)
HDL: 32.5 mg/dL — ABNORMAL LOW (ref >39.00)
LDL Cholesterol: 123 mg/dL — ABNORMAL HIGH (ref 0–99)
NonHDL: 144.17
Total CHOL/HDL Ratio: 5
Triglycerides: 105 mg/dL (ref 0.0–149.0)
VLDL: 21 mg/dL (ref 0.0–40.0)

## 2021-11-27 LAB — PSA, MEDICARE: PSA: 3.62 ng/ml (ref 0.10–4.00)

## 2021-11-27 LAB — HEMOGLOBIN A1C: Hgb A1c MFr Bld: 7.8 % — ABNORMAL HIGH (ref 4.6–6.5)

## 2021-11-29 ENCOUNTER — Other Ambulatory Visit: Payer: Self-pay | Admitting: Primary Care

## 2021-11-29 DIAGNOSIS — E785 Hyperlipidemia, unspecified: Secondary | ICD-10-CM

## 2021-12-17 ENCOUNTER — Telehealth: Payer: Self-pay | Admitting: Primary Care

## 2021-12-17 NOTE — Telephone Encounter (Signed)
Adam Jennings with [facility] called Checking status on pt Surgical Clearance Papers. Adam Jennings states that you can fax it to [phone]. Please advise.

## 2021-12-17 NOTE — Telephone Encounter (Signed)
Faxed and confirmation received.

## 2021-12-25 ENCOUNTER — Other Ambulatory Visit: Payer: Self-pay

## 2021-12-25 ENCOUNTER — Ambulatory Visit: Payer: Medicare HMO | Admitting: Urology

## 2021-12-25 ENCOUNTER — Encounter: Payer: Self-pay | Admitting: Urology

## 2021-12-25 VITALS — BP 153/78 | HR 76 | Ht 72.0 in | Wt 299.0 lb

## 2021-12-25 DIAGNOSIS — N138 Other obstructive and reflux uropathy: Secondary | ICD-10-CM | POA: Diagnosis not present

## 2021-12-25 DIAGNOSIS — R319 Hematuria, unspecified: Secondary | ICD-10-CM | POA: Diagnosis not present

## 2021-12-25 DIAGNOSIS — N401 Enlarged prostate with lower urinary tract symptoms: Secondary | ICD-10-CM

## 2021-12-25 LAB — BLADDER SCAN AMB NON-IMAGING

## 2021-12-25 NOTE — Progress Notes (Signed)
° °  12/25/2021 2:30 PM   Adam Jennings 01-25-1946 [id number]  Reason for visit: Gross hematuria, BPH, nocturia  HPI: 76 year old male with morbid obesity BMI of 40 and primarily urinary symptoms of nocturia 5-7x per night, as well as some mildly elevated PVRs in the past.  He also has a history of a mildly elevated PSA of 7 in 2018 with a negative prostate biopsy, and prostate measured 250 g at that time.  He underwent a cystoscopy in January 2022 that showed massive prostate with obstructing lateral lobes and friable tissue throughout, but no suspicious bladder lesions.  I previously had recommend evaluation for sleep apnea multiple times in the past.  In January 2022 I added finasteride, and he has had a significant improvement in the urinary symptoms since that time.  PVRs have normalized, and he is only getting up 2-3 times per night, and really denies any urinary symptoms during the day.  PVR is normal today at 74 mL.  He was sent over today for a single episode of gross hematuria with some small clots about 3 weeks ago that resolved spontaneously within 24 hours.  He was not having any pain at that time.  There is no recent cross-sectional imaging to review.   We discussed common possible etiologies of hematuria including BPH, malignancy, urolithiasis, medical renal disease, and idiopathic. Standard workup recommended by the [facility]UA includes imaging with CT urogram to assess the upper tracts, and cystoscopy.  With his recent normal cystoscopy 1 year ago, and known significant BPH with greater than 200 g prostate, I recommended starting with CT urogram and will call with those results-if any abnormality certainly would warrant cystoscopy.  Follow-up UA today, pending Continue finasteride CT urogram call with results, consider cystoscopy pending CT findings   Billey Co, MD  [facility] [address], [place], [place] [address] [phone]

## 2021-12-26 LAB — MICROSCOPIC EXAMINATION: Bacteria, UA: NONE SEEN

## 2021-12-26 LAB — URINALYSIS, COMPLETE
Bilirubin, UA: NEGATIVE
Glucose, UA: NEGATIVE
Ketones, UA: NEGATIVE
Leukocytes,UA: NEGATIVE
Nitrite, UA: NEGATIVE
Protein,UA: NEGATIVE
RBC, UA: NEGATIVE
Specific Gravity, UA: 1.025 (ref 1.005–1.030)
Urobilinogen, Ur: 0.2 mg/dL (ref 0.2–1.0)
pH, UA: 6 (ref 5.0–7.5)

## 2022-01-16 ENCOUNTER — Other Ambulatory Visit: Payer: Self-pay | Admitting: Urology

## 2022-01-30 ENCOUNTER — Other Ambulatory Visit: Payer: Self-pay

## 2022-01-30 ENCOUNTER — Encounter: Payer: Self-pay | Admitting: Emergency Medicine

## 2022-01-30 ENCOUNTER — Ambulatory Visit (INDEPENDENT_AMBULATORY_CARE_PROVIDER_SITE_OTHER): Payer: Medicare HMO | Admitting: Family Medicine

## 2022-01-30 ENCOUNTER — Inpatient Hospital Stay
Admission: EM | Admit: 2022-01-30 | Discharge: 2022-02-03 | DRG: 291 | Disposition: A | Discharge status: Home / Self Care | Payer: Medicare HMO | Attending: Internal Medicine | Admitting: Internal Medicine

## 2022-01-30 ENCOUNTER — Encounter: Payer: Self-pay | Admitting: Family Medicine

## 2022-01-30 ENCOUNTER — Ambulatory Visit (INDEPENDENT_AMBULATORY_CARE_PROVIDER_SITE_OTHER)
Admission: RE | Admit: 2022-01-30 | Discharge: 2022-01-30 | Disposition: A | Discharge status: Home / Self Care | Payer: Medicare HMO | Source: Ambulatory Visit | Attending: Family Medicine | Admitting: Family Medicine

## 2022-01-30 ENCOUNTER — Telehealth: Payer: Self-pay

## 2022-01-30 ENCOUNTER — Emergency Department: Payer: Medicare HMO

## 2022-01-30 ENCOUNTER — Telehealth: Payer: Self-pay | Admitting: Radiology

## 2022-01-30 VITALS — BP 138/82 | HR 73 | Temp 98.1°F | Ht 72.0 in | Wt 309.0 lb

## 2022-01-30 DIAGNOSIS — R0600 Dyspnea, unspecified: Secondary | ICD-10-CM | POA: Diagnosis not present

## 2022-01-30 DIAGNOSIS — J90 Pleural effusion, not elsewhere classified: Secondary | ICD-10-CM | POA: Diagnosis not present

## 2022-01-30 DIAGNOSIS — I10 Essential (primary) hypertension: Secondary | ICD-10-CM | POA: Diagnosis present

## 2022-01-30 DIAGNOSIS — I110 Hypertensive heart disease with heart failure: Principal | ICD-10-CM | POA: Diagnosis present

## 2022-01-30 DIAGNOSIS — I5021 Acute systolic (congestive) heart failure: Secondary | ICD-10-CM | POA: Diagnosis present

## 2022-01-30 DIAGNOSIS — Z79899 Other long term (current) drug therapy: Secondary | ICD-10-CM

## 2022-01-30 DIAGNOSIS — R7303 Prediabetes: Secondary | ICD-10-CM | POA: Diagnosis not present

## 2022-01-30 DIAGNOSIS — T461X5A Adverse effect of calcium-channel blockers, initial encounter: Secondary | ICD-10-CM | POA: Diagnosis not present

## 2022-01-30 DIAGNOSIS — Y92230 Patient room in hospital as the place of occurrence of the external cause: Secondary | ICD-10-CM | POA: Diagnosis not present

## 2022-01-30 DIAGNOSIS — R7989 Other specified abnormal findings of blood chemistry: Secondary | ICD-10-CM

## 2022-01-30 DIAGNOSIS — J810 Acute pulmonary edema: Secondary | ICD-10-CM

## 2022-01-30 DIAGNOSIS — R079 Chest pain, unspecified: Secondary | ICD-10-CM | POA: Diagnosis not present

## 2022-01-30 DIAGNOSIS — E785 Hyperlipidemia, unspecified: Secondary | ICD-10-CM | POA: Diagnosis present

## 2022-01-30 DIAGNOSIS — I248 Other forms of acute ischemic heart disease: Secondary | ICD-10-CM | POA: Diagnosis not present

## 2022-01-30 DIAGNOSIS — I4891 Unspecified atrial fibrillation: Secondary | ICD-10-CM

## 2022-01-30 DIAGNOSIS — E1165 Type 2 diabetes mellitus with hyperglycemia: Secondary | ICD-10-CM

## 2022-01-30 DIAGNOSIS — R0609 Other forms of dyspnea: Secondary | ICD-10-CM

## 2022-01-30 DIAGNOSIS — N179 Acute kidney failure, unspecified: Secondary | ICD-10-CM

## 2022-01-30 DIAGNOSIS — Z791 Long term (current) use of non-steroidal anti-inflammatories (NSAID): Secondary | ICD-10-CM

## 2022-01-30 DIAGNOSIS — Z8249 Family history of ischemic heart disease and other diseases of the circulatory system: Secondary | ICD-10-CM

## 2022-01-30 DIAGNOSIS — I952 Hypotension due to drugs: Secondary | ICD-10-CM | POA: Diagnosis not present

## 2022-01-30 DIAGNOSIS — R0602 Shortness of breath: Secondary | ICD-10-CM

## 2022-01-30 DIAGNOSIS — Z6841 Body Mass Index (BMI) 40.0 and over, adult: Secondary | ICD-10-CM

## 2022-01-30 DIAGNOSIS — Z20822 Contact with and (suspected) exposure to covid-19: Secondary | ICD-10-CM | POA: Diagnosis present

## 2022-01-30 DIAGNOSIS — I452 Bifascicular block: Secondary | ICD-10-CM | POA: Diagnosis present

## 2022-01-30 DIAGNOSIS — I509 Heart failure, unspecified: Secondary | ICD-10-CM

## 2022-01-30 DIAGNOSIS — J9811 Atelectasis: Secondary | ICD-10-CM | POA: Diagnosis not present

## 2022-01-30 DIAGNOSIS — Z87891 Personal history of nicotine dependence: Secondary | ICD-10-CM | POA: Diagnosis not present

## 2022-01-30 DIAGNOSIS — I5040 Unspecified combined systolic (congestive) and diastolic (congestive) heart failure: Secondary | ICD-10-CM | POA: Diagnosis not present

## 2022-01-30 DIAGNOSIS — E669 Obesity, unspecified: Secondary | ICD-10-CM | POA: Diagnosis present

## 2022-01-30 DIAGNOSIS — E119 Type 2 diabetes mellitus without complications: Secondary | ICD-10-CM | POA: Diagnosis present

## 2022-01-30 DIAGNOSIS — I517 Cardiomegaly: Secondary | ICD-10-CM | POA: Diagnosis not present

## 2022-01-30 DIAGNOSIS — R0789 Other chest pain: Secondary | ICD-10-CM | POA: Diagnosis not present

## 2022-01-30 DIAGNOSIS — R778 Other specified abnormalities of plasma proteins: Secondary | ICD-10-CM | POA: Diagnosis not present

## 2022-01-30 HISTORY — DX: Other specified abnormal findings of blood chemistry: R79.89

## 2022-01-30 LAB — BASIC METABOLIC PANEL
BUN: 17 mg/dL (ref 6–23)
CO2: 30 mEq/L (ref 19–32)
Calcium: 9.5 mg/dL (ref 8.4–10.5)
Chloride: 101 mEq/L (ref 96–112)
Creatinine, Ser: 0.83 mg/dL (ref 0.40–1.50)
GFR: 86.43 mL/min (ref >60.00)
Glucose, Bld: 166 mg/dL — ABNORMAL HIGH (ref 70–99)
Potassium: 4.6 mEq/L (ref 3.5–5.1)
Sodium: 136 mEq/L (ref 135–145)

## 2022-01-30 LAB — HEPATIC FUNCTION PANEL
ALT: 21 U/L (ref 0–53)
AST: 21 U/L (ref 0–37)
Albumin: 4.1 g/dL (ref 3.5–5.2)
Alkaline Phosphatase: 47 U/L (ref 39–117)
Bilirubin, Direct: 0.1 mg/dL (ref 0.0–0.3)
Total Bilirubin: 0.7 mg/dL (ref 0.2–1.2)
Total Protein: 7.5 g/dL (ref 6.0–8.3)

## 2022-01-30 LAB — CBC WITH DIFFERENTIAL/PLATELET
Basophils Absolute: 0.1 10*3/uL (ref 0.0–0.1)
Basophils Relative: 1 % (ref 0.0–3.0)
Eosinophils Absolute: 0.7 10*3/uL (ref 0.0–0.7)
Eosinophils Relative: 8 % — ABNORMAL HIGH (ref 0.0–5.0)
HCT: 37.7 % — ABNORMAL LOW (ref 39.0–52.0)
Hemoglobin: 12.8 g/dL — ABNORMAL LOW (ref 13.0–17.0)
Lymphocytes Relative: 11.7 % — ABNORMAL LOW (ref 12.0–46.0)
Lymphs Abs: 1.1 10*3/uL (ref 0.7–4.0)
MCHC: 33.9 g/dL (ref 30.0–36.0)
MCV: 91 fl (ref 78.0–100.0)
Monocytes Absolute: 1 10*3/uL (ref 0.1–1.0)
Monocytes Relative: 11.1 % (ref 3.0–12.0)
Neutro Abs: 6.4 10*3/uL (ref 1.4–7.7)
Neutrophils Relative %: 68.2 % (ref 43.0–77.0)
Platelets: 285 10*3/uL (ref 150.0–400.0)
RBC: 4.14 Mil/uL — ABNORMAL LOW (ref 4.22–5.81)
RDW: 14 % (ref 11.5–15.5)
WBC: 9.4 10*3/uL (ref 4.0–10.5)

## 2022-01-30 LAB — PROTIME-INR
INR: 1.1 (ref 0.8–1.2)
Prothrombin Time: 14.1 seconds (ref 11.4–15.2)

## 2022-01-30 LAB — HEMOGLOBIN A1C: Hgb A1c MFr Bld: 7.8 % — ABNORMAL HIGH (ref 4.6–6.5)

## 2022-01-30 LAB — D-DIMER, QUANTITATIVE: D-Dimer, Quant: 2.18 mcg/mL FEU — ABNORMAL HIGH (ref <0.50)

## 2022-01-30 LAB — RESP PANEL BY RT-PCR (FLU A&B, COVID) ARPGX2
Influenza A by PCR: NEGATIVE
Influenza B by PCR: NEGATIVE
SARS Coronavirus 2 by RT PCR: NEGATIVE

## 2022-01-30 LAB — TROPONIN I (HIGH SENSITIVITY)
High Sens Troponin I: 42 ng/L (ref 2–17)
Troponin I (High Sensitivity): 43 ng/L — ABNORMAL HIGH
Troponin I (High Sensitivity): 44 ng/L — ABNORMAL HIGH (ref <18)

## 2022-01-30 LAB — BRAIN NATRIURETIC PEPTIDE: Pro B Natriuretic peptide (BNP): 109 pg/mL — ABNORMAL HIGH (ref 0.0–100.0)

## 2022-01-30 LAB — CBG MONITORING, ED: Glucose-Capillary: 185 mg/dL — ABNORMAL HIGH (ref 70–99)

## 2022-01-30 MED ORDER — FUROSEMIDE 10 MG/ML IJ SOLN
40.0000 mg | Freq: Two times a day (BID) | INTRAMUSCULAR | Status: DC
  Administered 2022-01-31 – 2022-02-01 (×3): 40 mg via INTRAVENOUS
  Filled 2022-01-30 (×3): qty 4

## 2022-01-30 MED ORDER — IOHEXOL 350 MG/ML SOLN
75.0000 mL | Freq: Once | INTRAVENOUS | Status: AC | PRN
  Administered 2022-01-30: 18:00:00 100 mL via INTRAVENOUS

## 2022-01-30 MED ORDER — SENNOSIDES-DOCUSATE SODIUM 8.6-50 MG PO TABS
1.0000 | ORAL_TABLET | Freq: Every evening | ORAL | Status: DC | PRN
  Administered 2022-02-03: 1 via ORAL
  Filled 2022-01-30: qty 1

## 2022-01-30 MED ORDER — ROSUVASTATIN CALCIUM 10 MG PO TABS
20.0000 mg | ORAL_TABLET | Freq: Every day | ORAL | Status: DC
Start: 2022-01-31 — End: 2022-02-03
  Administered 2022-01-31 – 2022-02-03 (×4): 20 mg via ORAL
  Filled 2022-01-30 (×3): qty 2
  Filled 2022-01-30: qty 1

## 2022-01-30 MED ORDER — LISINOPRIL 20 MG PO TABS
20.0000 mg | ORAL_TABLET | Freq: Every day | ORAL | Status: DC
  Administered 2022-01-31: 20 mg via ORAL
  Filled 2022-01-30: qty 2

## 2022-01-30 MED ORDER — SODIUM CHLORIDE 0.9% FLUSH
3.0000 mL | Freq: Two times a day (BID) | INTRAVENOUS | Status: DC
  Administered 2022-01-30 – 2022-02-03 (×6): 3 mL via INTRAVENOUS

## 2022-01-30 MED ORDER — ACETAMINOPHEN 650 MG RE SUPP: 650.0000 mg | Freq: Four times a day (QID) | RECTAL | Status: DC | PRN

## 2022-01-30 MED ORDER — FUROSEMIDE 10 MG/ML IJ SOLN
40.0000 mg | Freq: Once | INTRAMUSCULAR | Status: AC
  Administered 2022-01-30: 18:00:00 40 mg via INTRAVENOUS
  Filled 2022-01-30: qty 4

## 2022-01-30 MED ORDER — INSULIN ASPART 100 UNIT/ML IJ SOLN
0.0000 [IU] | Freq: Three times a day (TID) | INTRAMUSCULAR | Status: DC
  Administered 2022-01-31 – 2022-02-03 (×8): 1 [IU] via SUBCUTANEOUS
  Filled 2022-01-30 (×8): qty 1

## 2022-01-30 MED ORDER — ENOXAPARIN SODIUM 80 MG/0.8ML IJ SOSY
0.5000 mg/kg | PREFILLED_SYRINGE | INTRAMUSCULAR | Status: DC
  Administered 2022-01-30: 23:00:00 70 mg via SUBCUTANEOUS
  Filled 2022-01-30 (×2): qty 0.7

## 2022-01-30 MED ORDER — FINASTERIDE 5 MG PO TABS
5.0000 mg | ORAL_TABLET | Freq: Every day | ORAL | Status: DC
Start: 2022-01-31 — End: 2022-02-03
  Administered 2022-01-31 – 2022-02-03 (×4): 5 mg via ORAL
  Filled 2022-01-30 (×4): qty 1

## 2022-01-30 MED ORDER — FUROSEMIDE 20 MG PO TABS: 20.0000 mg | ORAL_TABLET | Freq: Every day | ORAL | 0 refills | Status: DC

## 2022-01-30 MED ORDER — ACETAMINOPHEN 325 MG PO TABS
650.0000 mg | ORAL_TABLET | Freq: Four times a day (QID) | ORAL | Status: DC | PRN
  Administered 2022-02-01: 650 mg via ORAL
  Filled 2022-01-30: qty 2

## 2022-01-30 NOTE — Progress Notes (Addendum)
Adam Jennings T. Adam Kurtenbach, MD, CAQ Sports [facility] [address] [place], [id number]  Phone: [phone]   FAX: [phone]  Adam HessRoger Leon Jennings - 76 y.o. male   MRN [id number]   Date of Birth: 06/25/1946  Date: 01/30/2022   PCP: Doreene Nestlark, [name], NP   Referral: Doreene Nestlark, [name], NP  Chief Complaint  Patient presents with   Dyspnea    Started a few days ago. Says it hurst to take a deep breath.     This visit occurred during the SARS-CoV-2 public health emergency.  Safety protocols were in place, including screening questions prior to the visit, additional usage of staff PPE, and extensive cleaning of exam room while observing appropriate contact time as indicated for disinfecting solutions.   Subjective:   Adam Jennings is a 76 y.o. very pleasant male patient with Body mass index is 41.91 kg/m. who presents with the following:  Patient is a reasonably healthy 76 year old, and he presents today with some new onset dyspnea and dyspnea on exertion over the last few days.  He does not have any known history of pulmonary or cardiac disease.  He denies any chest pain, no jaw pain or arm pain.  Initially felt like he had cracked a rib and he was coughing and felt a bit like he could not breathe.  He does not have any fever, chills, sweats, nauseousness vomiting, diarrhea, rhinorrhea or other cold type symptoms.  No sore throat or earache.  Right now his back is hurting quite a bit he has some pain in the shoulders.  He does appear to be short winded while walking in the hallway.  At baseline, he tells me that he is relatively healthy, and he is able to do essentially what he wants to do.  Review of Systems is noted in the HPI, as appropriate  Patient Active Problem List   Diagnosis Date Noted   Preventative health care 11/26/2021   Preoperative clearance 10/23/2020   Nocturia 04/05/2020   Numbness and tingling of left upper extremity  04/05/2020   Periumbilical hernia 03/15/2018   Cloudy vision 06/30/2017   Elevated PSA 03/11/2017   Hyperlipidemia 03/11/2017   Prediabetes 03/11/2017   Chronic pain of right knee 10/09/2016   Essential hypertension 02/25/2016    Past Medical History:  Diagnosis Date   Dizziness    Elevated blood pressure     Past Surgical History:  Procedure Laterality Date   CATARACT EXTRACTION W/ INTRAOCULAR LENS IMPLANT Right 11/05/2017   CATARACT EXTRACTION W/ INTRAOCULAR LENS IMPLANT Left 12/08/2017    Family History  Problem Relation Age of Onset   Hypertension Father      Objective:   BP 138/82 (BP Location: Left Arm, Patient Position: Sitting, Cuff Size: Large)    Pulse 73    Temp 98.1 F (36.7 C)    Ht 6' (1.829 m)    Wt (!) 309 lb (140.2 kg)    SpO2 99%    BMI 41.91 kg/m   GEN: No acute distress; alert,appropriate. PULM: Breathing comfortably in no respiratory distress PSYCH: Normally interactive.  CV: RRR, no m/g/r  Pulm: Respiratory rate is increased, but I do not appreciate any rhonchi or focal crackles. Minimal LE edema  Laboratory and Imaging Data:  Assessment and Plan:     ICD-10-CM   1. Shortness of breath  R06.02 DG Chest 2 View    EKG 12-Lead    Brain natriuretic peptide  Basic metabolic panel    CBC with Differential/Platelet    Hepatic function panel    Troponin I (High Sensitivity)    D-dimer, quantitative    2. DOE (dyspnea on exertion)  R06.09 DG Chest 2 View    EKG 12-Lead    Brain natriuretic peptide    Basic metabolic panel    CBC with Differential/Platelet    Hepatic function panel    Troponin I (High Sensitivity)    D-dimer, quantitative    3. Acute pulmonary edema (HCC)  J81.0 Brain natriuretic peptide    Basic metabolic panel    CBC with Differential/Platelet    Hepatic function panel    Troponin I (High Sensitivity)    D-dimer, quantitative    4. Prediabetes  R73.03 Hemoglobin A1c     EKG: Normal sinus rhythm. Normal axis,  normal R wave progression, No acute ST elevation or depression.   New onset shortness of breath with some DOE without a clear source.  Primarily, I would like to assess potential acute heart failure, and also I think that we need to rule out MI with a troponin, although his EKG looks unchanged.  Check a D-dimer, PE could potentially be possible.  I did discuss the case with one of my partners.  I do think it is safe to evaluate these on a stat basis as an outpatient, and he understands that it might be possible that he needs to go to the emergency room for ASAP evaluation if his D-dimer or cardiac enzymes come back positive.  We also check his basic labs today.  I think that we can feel comfortable giving him some diuresis right now, and I am going to have him take 40 mg of Lasix when he gets home and then take 20 mg of Lasix until complete disposition.  Addendum: 01/30/22 4:20 PM  I just received a call that the patient's troponin came back at 42.  This explains the patient's shortness of breath over the last 3 to 4 days.  Unfortunately, it looks as if the patient has had a NSTEMI.  I spoke to the patient and his daughter and they are going right now to the [facility] emergency room.  I also called the emergency room physician to let them know about the situation.  I very much appreciate everybody's help.  Addendum: 01/30/22 5:00 PM  D-dimer just came back positive, also, at 2.18.  Thankfully, he is in the ER now.    Results for orders placed or performed in visit on 01/30/22  Brain natriuretic peptide  Result Value Ref Range   Pro B Natriuretic peptide (BNP) 109.0 (H) 0.0 - 100.0 pg/mL  Basic metabolic panel  Result Value Ref Range   Sodium 136 135 - 145 mEq/L   Potassium 4.6 3.5 - 5.1 mEq/L   Chloride 101 96 - 112 mEq/L   CO2 30 19 - 32 mEq/L   Glucose, Bld 166 (H) 70 - 99 mg/dL   BUN 17 6 - 23 mg/dL   Creatinine, Ser 7.51 0.40 - 1.50 mg/dL   GFR 02.58 >52.77 mL/min   Calcium 9.5 8.4 -  10.5 mg/dL  CBC with Differential/Platelet  Result Value Ref Range   WBC 9.4 4.0 - 10.5 K/uL   RBC 4.14 (L) 4.22 - 5.81 Mil/uL   Hemoglobin 12.8 (L) 13.0 - 17.0 g/dL   HCT 82.4 (L) 23.5 - 36.1 %   MCV 91.0 78.0 - 100.0 fl   MCHC 33.9 30.0 - 36.0  g/dL   RDW 01.6 01.0 - 93.2 %   Platelets 285.0 150.0 - 400.0 K/uL   Neutrophils Relative % 68.2 43.0 - 77.0 %   Lymphocytes Relative 11.7 (L) 12.0 - 46.0 %   Monocytes Relative 11.1 3.0 - 12.0 %   Eosinophils Relative 8.0 (H) 0.0 - 5.0 %   Basophils Relative 1.0 0.0 - 3.0 %   Neutro Abs 6.4 1.4 - 7.7 K/uL   Lymphs Abs 1.1 0.7 - 4.0 K/uL   Monocytes Absolute 1.0 0.1 - 1.0 K/uL   Eosinophils Absolute 0.7 0.0 - 0.7 K/uL   Basophils Absolute 0.1 0.0 - 0.1 K/uL  Hepatic function panel  Result Value Ref Range   Total Bilirubin 0.7 0.2 - 1.2 mg/dL   Bilirubin, Direct 0.1 0.0 - 0.3 mg/dL   Alkaline Phosphatase 47 39 - 117 U/L   AST 21 0 - 37 U/L   ALT 21 0 - 53 U/L   Total Protein 7.5 6.0 - 8.3 g/dL   Albumin 4.1 3.5 - 5.2 g/dL  Hemoglobin T5T  Result Value Ref Range   Hgb A1c MFr Bld 7.8 (H) 4.6 - 6.5 %  Troponin I (High Sensitivity)  Result Value Ref Range   High Sens Troponin I 42 (HH) 2 - 17 ng/L     Meds ordered this encounter  Medications   furosemide (LASIX) 20 MG tablet    Sig: Take 1 tablet (20 mg total) by mouth daily.    Dispense:  20 tablet    Refill:  0   There are no discontinued medications. Orders Placed This Encounter  Procedures   DG Chest 2 View   Brain natriuretic peptide   Basic metabolic panel   CBC with Differential/Platelet   Hepatic function panel   Hemoglobin A1c   D-dimer, quantitative   EKG 12-Lead    Follow-up: No follow-ups on file.  [employer] speech-to-text software was used for transcription in this dictation.  Possible transcriptional errors can occur using Animal nutritionist.   Signed,  Elpidio Galea. Taren Dymek, MD   Outpatient Encounter Medications as of 01/30/2022  Medication Sig    finasteride (PROSCAR) 5 MG tablet TAKE 1 TABLET (5 MG TOTAL) BY MOUTH DAILY.   furosemide (LASIX) 20 MG tablet Take 1 tablet (20 mg total) by mouth daily.   lisinopril (ZESTRIL) 20 MG tablet Take 1 tablet (20 mg total) by mouth daily. For blood pressure.   Omega-3 Fatty Acids (FISH OIL) 1000 MG CAPS Take 1,000 mg by mouth daily.   rosuvastatin (CRESTOR) 20 MG tablet Take 1 tablet (20 mg total) by mouth daily. For cholesterol. Have patient call the office!   No facility-administered encounter medications on file as of 01/30/2022.

## 2022-01-30 NOTE — H&P (Signed)
History and Physical    Adam Jennings KWI:[id number] DOB: 10-23-1946 DOA: 01/30/2022  PCP: Doreene Nest, NP   Patient coming from: Home   Chief Complaint: SOB   HPI: Adam Jennings is a pleasant 76 y.o. male with medical history significant for hypertension, diet-controlled diabetes mellitus, and BMI 42, now presenting to the emergency department for evaluation of shortness of breath.  Patient reports 3 to 4 days of progressive dyspnea with orthopnea and bilateral pedal edema.  He denies any associated chest pain, fever, or chills.  He has not been coughing much.  He had been experiencing some pain in his shoulders and upper back when the symptoms started but none now.  He saw his PCP for the symptoms today, was sent for outpatient blood work and given a prescription for Lasix, had not taken any Lasix yet, but came into the ED after D-dimer came back elevated at 2.18 and troponin was elevated to 42.  ED Course: Upon arrival to the ED, patient is found to be afebrile and saturating mid to upper 90s on room air with tachypnea and stable blood pressure.  EKG features sinus rhythm with PACs, RBBB, LAFB, and LVH with repolarization abnormality.  CTA chest is negative for PE but notable for small right and trace left pleural effusions with adjacent atelectasis or infiltrate, mild left-sided cardiac enlargement, and small pericardial effusion.  Troponin was 43 and BNP 109.  Patient was given 40 mg IV Lasix and has put out approximately a liter.  Review of Systems:  All other systems reviewed and apart from HPI, are negative.  Past Medical History:  Diagnosis Date   Dizziness    Elevated blood pressure     Past Surgical History:  Procedure Laterality Date   CATARACT EXTRACTION W/ INTRAOCULAR LENS IMPLANT Right 11/05/2017   CATARACT EXTRACTION W/ INTRAOCULAR LENS IMPLANT Left 12/08/2017    Social History:   reports that he has never smoked. He has never been exposed to tobacco smoke. He  has never used smokeless tobacco. He reports that he does not drink alcohol and does not use drugs.  No Known Allergies  Family History  Problem Relation Age of Onset   Hypertension Father      Prior to Admission medications   Medication Sig Start Date End Date Taking? Authorizing Provider  finasteride (PROSCAR) 5 MG tablet TAKE 1 TABLET (5 MG TOTAL) BY MOUTH DAILY. 01/17/22   Sondra Come, MD  furosemide (LASIX) 20 MG tablet Take 1 tablet (20 mg total) by mouth daily. 01/30/22   Copland, Karleen Hampshire, MD  lisinopril (ZESTRIL) 20 MG tablet Take 1 tablet (20 mg total) by mouth daily. For blood pressure. 11/26/21   Doreene Nest, NP  Omega-3 Fatty Acids (FISH OIL) 1000 MG CAPS Take 1,000 mg by mouth daily.    [provider]  rosuvastatin (CRESTOR) 20 MG tablet Take 1 tablet (20 mg total) by mouth daily. For cholesterol. Have patient call the office! 11/29/21   Doreene Nest, NP    Physical Exam: Vitals:   01/30/22 1642 01/30/22 1653 01/30/22 1816  BP:  (!) 142/73 (!) 144/61  Pulse:  74 70  Resp:  20 (!) 28  Temp:  98.4 F (36.9 C)   TempSrc:  Oral   SpO2:  95% 96%  Weight: (!) 140 kg    Height: 6' (1.829 m)      Constitutional: NAD, calm  Eyes: PERTLA, lids and conjunctivae normal ENMT: Mucous membranes are moist.  Posterior pharynx clear of any exudate or lesions.   Neck: supple, no masses  Respiratory: Mild tachypnea, no wheezing. Speaking full sentences. No accessory muscle use.  Cardiovascular: S1 & S2 heard, regular rate and rhythm. Mild lower leg swelling bilaterally. Abdomen: No distension, no tenderness, soft. Bowel sounds active.  Musculoskeletal: no clubbing / cyanosis. No joint deformity upper and lower extremities.   Skin: no significant rashes, lesions, ulcers. Warm, dry, well-perfused. Neurologic: No gross facial asymmetry. Moving all extremities. Alert and oriented.  Psychiatric: Very pleasant. Cooperative.    Labs and Imaging on Admission: I have  personally reviewed following labs and imaging studies  CBC: Recent Labs  Lab 01/30/22 1344  WBC 9.4  NEUTROABS 6.4  HGB 12.8*  HCT 37.7*  MCV 91.0  PLT 285.0   Basic Metabolic Panel: Recent Labs  Lab 01/30/22 1344  NA 136  K 4.6  CL 101  CO2 30  GLUCOSE 166*  BUN 17  CREATININE 0.83  CALCIUM 9.5   GFR: Estimated Creatinine Clearance: 113.3 mL/min (by C-G formula based on SCr of 0.83 mg/dL). Liver Function Tests: Recent Labs  Lab 01/30/22 1344  AST 21  ALT 21  ALKPHOS 47  BILITOT 0.7  PROT 7.5  ALBUMIN 4.1   No results for input(s): LIPASE, AMYLASE in the last 168 hours. No results for input(s): AMMONIA in the last 168 hours. Coagulation Profile: Recent Labs  Lab 01/30/22 1654  INR 1.1   Cardiac Enzymes: No results for input(s): CKTOTAL, CKMB, CKMBINDEX, TROPONINI in the last 168 hours. BNP (last 3 results) Recent Labs    01/30/22 1344  PROBNP 109.0*   HbA1C: Recent Labs    01/30/22 1344  HGBA1C 7.8*   CBG: No results for input(s): GLUCAP in the last 168 hours. Lipid Profile: No results for input(s): CHOL, HDL, LDLCALC, TRIG, CHOLHDL, LDLDIRECT in the last 72 hours. Thyroid Function Tests: No results for input(s): TSH, T4TOTAL, FREET4, T3FREE, THYROIDAB in the last 72 hours. Anemia Panel: No results for input(s): VITAMINB12, FOLATE, FERRITIN, TIBC, IRON, RETICCTPCT in the last 72 hours. Urine analysis:    Component Value Date/Time   COLORURINE YELLOW 02/13/2016 0059   APPEARANCEUR Clear 12/25/2021 1421   LABSPEC 1.020 02/13/2016 0059   PHURINE 6.5 02/13/2016 0059   GLUCOSEU Negative 12/25/2021 1421   HGBUR NEGATIVE 02/13/2016 0059   BILIRUBINUR Negative 12/25/2021 1421   KETONESUR NEGATIVE 02/13/2016 0059   PROTEINUR Negative 12/25/2021 1421   PROTEINUR NEGATIVE 02/13/2016 0059   NITRITE Negative 12/25/2021 1421   NITRITE NEGATIVE 02/13/2016 0059   LEUKOCYTESUR Negative 12/25/2021 1421   Sepsis  Labs: @LABRCNTIP (procalcitonin:4,lacticidven:4) )No results found for this or any previous visit (from the past 240 hour(s)).   Radiological Exams on Admission: CT Angio Chest PE W and/or Wo Contrast  Result Date: 01/30/2022 CLINICAL DATA:  Pulmonary embolism suspected, D-dimer positive. Chest pain which radiates. EXAM: CT ANGIOGRAPHY CHEST WITH CONTRAST TECHNIQUE: Multidetector CT imaging of the chest was performed using the standard protocol during bolus administration of intravenous contrast. Multiplanar CT image reconstructions and MIPs were obtained to evaluate the vascular anatomy. RADIATION DOSE REDUCTION: This exam was performed according to the departmental dose-optimization program which includes automated exposure control, adjustment of the mA and/or kV according to patient size and/or use of iterative reconstruction technique. CONTRAST:  100mL OMNIPAQUE IOHEXOL 350 MG/ML SOLN COMPARISON:  None. FINDINGS: Cardiovascular: Satisfactory opacification of the pulmonary arteries to the segmental level. No evidence of pulmonary embolism. Aortic atherosclerosis without aneurysmal dilation. Mild left-sided cardiac  enlargement. Small pericardial effusion. Mediastinum/Nodes: No supraclavicular adenopathy. No discrete thyroid nodule. No pathologically enlarged mediastinal, hilar or axillary lymph nodes. Lungs/Pleura: Diffuse bilateral bronchial wall thickening. Small right-sided pleural effusion with adjacent consolidation. Trace left pleural effusion with adjacent consolidation. Linear opacities in the right middle lobe and right lower lobe are favored to reflect atelectasis. Scattered right greater than left gas-filled cystic spaces may reflect pneumatoceles. Upper Abdomen: No acute abnormality. Musculoskeletal: Thoracic diffuse idiopathic skeletal hyperostosis. No acute osseous abnormality. Review of the MIP images confirms the above findings. IMPRESSION: 1. No evidence of acute pulmonary embolism. 2. Small  right and trace left pleural effusions with adjacent consolidations which may reflect atelectasis versus infiltrate. 3. Diffuse bilateral bronchial wall thickening with linear atelectasis in the right middle and right lower lobes. 4. Small pericardial effusion. 5.  Aortic Atherosclerosis (ICD10-I70.0). Electronically Signed   By: Maudry Mayhew M.D.   On: 01/30/2022 18:56    EKG: Independently reviewed. Sinus rhythm, PACs, RBBB, LAFB, LVH with repolarization abnormality.   Assessment/Plan   1. New-onset CHF  - Presents with several days of SOB and orthopnea and found to have slightly elevated BNP and troponin  - Given 40 mg IV Lasix in ED  - Continue diuresis with IV Lasix, check echocardiogram    2. Hypertension  - Continue lisinopril    3. Type II DM  - A1c is 7.8%  - Check CBGs and use low-intensity SSI if needed for now   4. Elevated troponin  - Troponin mildly elevated and flat with no chest pain to suggest ACS  - Likely related to acute CHF, follow-up echo results   5. Elevated d-dimer  - No PE on CTA chest in ED, no evidence for DVT    DVT prophylaxis: Lovenox  Code Status: Full  Level of Care: Level of care: Telemetry Cardiac Family Communication: None present  Disposition Plan:  Patient is from: home  Anticipated d/c is to: Home  Anticipated d/c date is: 3/10 or 02/01/22  Patient currently: pending echo, improvement in respiratory status  Consults called: none  Admission status: Observation     Briscoe Deutscher, MD [facility]  01/30/2022, 8:02 PM

## 2022-01-30 NOTE — Telephone Encounter (Signed)
See my other notes.

## 2022-01-30 NOTE — Telephone Encounter (Signed)
[facility] called a critical Troponin - 42. Results given to Dr Lorelei Pont. ?

## 2022-01-30 NOTE — Patient Instructions (Signed)
On the Furosemide tablets, take 2 tablets when you get them, and then do 1 tablet a day for now. ?

## 2022-01-30 NOTE — ED Triage Notes (Signed)
Pt comes into the ED via POV c/o chest pain that is all the way across the chest as well as neck and shoulder pain for the past couple days.  Pt also states he has had SHOB with it.  PT's MD called to let us know the patient had an elevated troponin with their blood work.  Pt currently has even and unlabored respirations.  ?

## 2022-01-30 NOTE — Telephone Encounter (Signed)
CRITICAL VALUE STICKER ? ?CRITICAL VALUE: D Dimer  ? ?RECEIVER (on-site recipient of call): Roxanne Orner  ? ?DATE & TIME NOTIFIED: 01/30/22 at 4:42 ? ?MESSENGER (representative from lab): Judeth Cornfield  ? ?MD NOTIFIED: Copland  ? ?TIME OF NOTIFICATION: 4:44 ? ?RESPONSE: Patient is at hospital no action needed.  ? ?

## 2022-01-30 NOTE — ED Notes (Signed)
Daughter can be called regarding patient.  Crista [phone].  ?

## 2022-01-30 NOTE — ED Provider Notes (Signed)
? ?[facility] ?Provider Note ? ? ? Event Date/Time  ? First MD Initiated Contact with Patient 01/30/22 1708   ?  (approximate) ? ? ?History  ? ?Chest Pain ? ? ?HPI ? ?Eugune Giancarlos Jennings is a 76 y.o. male here with shortness of breath.  Patient states that over the last several weeks, he separates worsening shortness of breath.  He initially noticed it with exertion only, but is now present at rest.  He states he can only walk across the room without getting extremely winded.  He has had orthopnea.  His had to sleep upright.  He has no history of heart failure or cardiac disease.  Denies any chest pain.  No arrhythmia.  He has had some increased swelling in his bilateral legs.  No fevers or chills.  No cough or sputum production. ?  ? ? ?Physical Exam  ? ?Triage Vital Signs: ?ED Triage Vitals  ?Enc Vitals Group  ?   BP 01/30/22 1653 (!) 142/73  ?   Pulse Rate 01/30/22 1653 74  ?   Resp 01/30/22 1653 20  ?   Temp 01/30/22 1653 98.4 ?F (36.9 ?C)  ?   Temp Source 01/30/22 1653 Oral  ?   SpO2 01/30/22 1653 95 %  ?   Weight 01/30/22 1642 (!) 308 lb 10.3 oz (140 kg)  ?   Height 01/30/22 1642 6' (1.829 m)  ?   Head Circumference --   ?   Peak Flow --   ?   Pain Score 01/30/22 1642 1  ?   Pain Loc --   ?   Pain Edu? --   ?   Excl. in GC? --   ? ? ?Most recent vital signs: ?Vitals:  ? 01/30/22 1653 01/30/22 1816  ?BP: (!) 142/73 (!) 144/61  ?Pulse: 74 70  ?Resp: 20 (!) 28  ?Temp: 98.4 ?F (36.9 ?C)   ?SpO2: 95% 96%  ? ? ? ?General: Awake, no distress.  ?CV:  Good peripheral perfusion.  Regular rate and rhythm. ?Resp:  Normal effort.  Bibasilar rales with slight tachypnea. ?Abd:  No distention.  Mild distention. ?Other:  2+ pitting edema bilateral lower extremities. ? ? ?ED Results / Procedures / Treatments  ? ?Labs ?(all labs ordered are listed, but only abnormal results are displayed) ?Labs Reviewed  ?TROPONIN I (HIGH SENSITIVITY) - Abnormal; Notable for the following components:  ?    Result Value  ?  Troponin I (High Sensitivity) 43 (*)   ? All other components within normal limits  ?RESP PANEL BY RT-PCR (FLU A&B, COVID) [id number]  ?PROTIME-INR  ?BRAIN NATRIURETIC PEPTIDE  ?TROPONIN I (HIGH SENSITIVITY)  ? ? ? ?EKG ?Sinus rhythm with PACs.  Bifascicular block.  No ST elevations.  No ischemia.  Ventricular rate 73, PR 182, QRS 150, QTc 478. ? ? ?RADIOLOGY ?CT angio chest: No PE, small right and left trace pleural effusions with consolidations versus infiltrate, bronchial wall thickening ?Chest x-ray: Reviewed by me, is concerning for edema ? ?I also independently reviewed and agree wit radiologist interpretations. ? ? ?PROCEDURES: ? ?Critical Care performed: No ? ?.1-3 Lead EKG Interpretation ?Performed by: Shaune Pollack, MD ?Authorized by: Shaune Pollack, MD  ? ?  Interpretation: normal   ?  ECG rate:  70-80 ?  ECG rate assessment: normal   ?  Rhythm: sinus rhythm   ?  Ectopy: none   ?  Conduction: normal   ?Comments:  ?   Indication: SOB ? ? ? ?  MEDICATIONS ORDERED IN ED: ?Medications  ?furosemide (LASIX) injection 40 mg (40 mg Intravenous Given 01/30/22 1810)  ?iohexol (OMNIPAQUE) 350 MG/ML injection 75 mL (100 mLs Intravenous Contrast Given 01/30/22 1823)  ? ? ? ?IMPRESSION / MDM / ASSESSMENT AND PLAN / ED COURSE  ?I reviewed the triage vital signs and the nursing notes. ?             ?               ? ? ?The patient is on the cardiac monitor to evaluate for evidence of arrhythmia and/or significant heart rate changes. ? ? ?MDM:  ?76 year old male here with progressively worsening dyspnea, not present at rest.  Clinically, concern for possible CHF.  Patient has no history of this.  He appears edematous with signs of orthopnea and exertional dyspnea.  Differential includes possible PE, cardiomyopathy, liver or renal failure.  I was able to review labs from PCP, which show very minimal troponin elevation but otherwise reassuring.  He has had no chest pain.  Troponin is stable here.  EKG is nonspecific, suspect this  is likely demand related to type suspect his underlying CHF.  CT angio obtained, reviewed, shows no evidence of PE.  He does, however, have bilateral effusions as well as a small pericardial effusion.  Patient also has bronchial wall thickening.  Denies any cough, fever, or infectious symptoms.  Will admit for diuresis and further work-up.  Patient is updated and in agreement. ? ? ?MEDICATIONS GIVEN IN ED: ?Medications  ?furosemide (LASIX) injection 40 mg (40 mg Intravenous Given 01/30/22 1810)  ?iohexol (OMNIPAQUE) 350 MG/ML injection 75 mL (100 mLs Intravenous Contrast Given 01/30/22 1823)  ? ? ? ?Consults:  ?Hospitalist consulted for admission ? ? ?EMR reviewed  ?Reviewed PCP notes including notes from Dr. Suzzanne Cloud, who I also discussed the case with over telephone ? ? ? ? ?FINAL CLINICAL IMPRESSION(S) / ED DIAGNOSES  ? ?Final diagnoses:  ?Dyspnea on exertion  ?Acute pulmonary edema (HCC)  ? ? ? ?Rx / DC Orders  ? ?ED Discharge Orders   ? ? None  ? ?  ? ? ? ?Note:  This document was prepared using Dragon voice recognition software and may include unintentional dictation errors. ?  ?Shaune Pollack, MD ?01/30/22 1952 ? ?

## 2022-01-30 NOTE — Telephone Encounter (Signed)
He is at the hospital right now in the ER. ?

## 2022-01-31 ENCOUNTER — Inpatient Hospital Stay
Admit: 2022-01-31 | Discharge: 2022-01-31 | Disposition: A | Discharge status: Home / Self Care | Payer: Medicare HMO | Attending: Internal Medicine | Admitting: Internal Medicine

## 2022-01-31 DIAGNOSIS — I509 Heart failure, unspecified: Secondary | ICD-10-CM | POA: Diagnosis not present

## 2022-01-31 DIAGNOSIS — I4891 Unspecified atrial fibrillation: Secondary | ICD-10-CM | POA: Diagnosis present

## 2022-01-31 DIAGNOSIS — R0602 Shortness of breath: Secondary | ICD-10-CM | POA: Diagnosis present

## 2022-01-31 DIAGNOSIS — Z87891 Personal history of nicotine dependence: Secondary | ICD-10-CM | POA: Diagnosis not present

## 2022-01-31 DIAGNOSIS — Z79899 Other long term (current) drug therapy: Secondary | ICD-10-CM | POA: Diagnosis not present

## 2022-01-31 DIAGNOSIS — T461X5A Adverse effect of calcium-channel blockers, initial encounter: Secondary | ICD-10-CM | POA: Diagnosis not present

## 2022-01-31 DIAGNOSIS — R7989 Other specified abnormal findings of blood chemistry: Secondary | ICD-10-CM | POA: Diagnosis present

## 2022-01-31 DIAGNOSIS — E785 Hyperlipidemia, unspecified: Secondary | ICD-10-CM | POA: Diagnosis present

## 2022-01-31 DIAGNOSIS — Z8249 Family history of ischemic heart disease and other diseases of the circulatory system: Secondary | ICD-10-CM | POA: Diagnosis not present

## 2022-01-31 DIAGNOSIS — Y92230 Patient room in hospital as the place of occurrence of the external cause: Secondary | ICD-10-CM | POA: Diagnosis not present

## 2022-01-31 DIAGNOSIS — Z791 Long term (current) use of non-steroidal anti-inflammatories (NSAID): Secondary | ICD-10-CM | POA: Diagnosis not present

## 2022-01-31 DIAGNOSIS — I248 Other forms of acute ischemic heart disease: Secondary | ICD-10-CM | POA: Diagnosis present

## 2022-01-31 DIAGNOSIS — I5021 Acute systolic (congestive) heart failure: Secondary | ICD-10-CM | POA: Diagnosis present

## 2022-01-31 DIAGNOSIS — I452 Bifascicular block: Secondary | ICD-10-CM | POA: Diagnosis present

## 2022-01-31 DIAGNOSIS — N179 Acute kidney failure, unspecified: Secondary | ICD-10-CM | POA: Diagnosis present

## 2022-01-31 DIAGNOSIS — Z6841 Body Mass Index (BMI) 40.0 and over, adult: Secondary | ICD-10-CM | POA: Diagnosis not present

## 2022-01-31 DIAGNOSIS — I952 Hypotension due to drugs: Secondary | ICD-10-CM | POA: Diagnosis not present

## 2022-01-31 DIAGNOSIS — I110 Hypertensive heart disease with heart failure: Secondary | ICD-10-CM | POA: Diagnosis present

## 2022-01-31 DIAGNOSIS — Z20822 Contact with and (suspected) exposure to covid-19: Secondary | ICD-10-CM | POA: Diagnosis present

## 2022-01-31 DIAGNOSIS — E669 Obesity, unspecified: Secondary | ICD-10-CM | POA: Diagnosis present

## 2022-01-31 DIAGNOSIS — E119 Type 2 diabetes mellitus without complications: Secondary | ICD-10-CM | POA: Diagnosis present

## 2022-01-31 LAB — BASIC METABOLIC PANEL
Anion gap: 6 (ref 5–15)
BUN: 17 mg/dL (ref 8–23)
CO2: 31 mmol/L (ref 22–32)
Calcium: 8.8 mg/dL — ABNORMAL LOW (ref 8.9–10.3)
Chloride: 101 mmol/L (ref 98–111)
Creatinine, Ser: 0.99 mg/dL (ref 0.61–1.24)
GFR, Estimated: >60 mL/min (ref >60)
Glucose, Bld: 186 mg/dL — ABNORMAL HIGH (ref 70–99)
Potassium: 4.4 mmol/L (ref 3.5–5.1)
Sodium: 138 mmol/L (ref 135–145)

## 2022-01-31 LAB — MAGNESIUM: Magnesium: 2 mg/dL (ref 1.7–2.4)

## 2022-01-31 LAB — ECHOCARDIOGRAM COMPLETE
AR max vel: 3.58 cm2
AV Area VTI: 3.47 cm2
AV Area mean vel: 3.48 cm2
AV Mean grad: 5 mmHg
AV Peak grad: 10.2 mmHg
Ao pk vel: 1.6 m/s
Area-P 1/2: 3.31 cm2
Calc EF: 43 %
Height: 72 in
MV VTI: 2.63 cm2
S' Lateral: 5.06 cm
Single Plane A2C EF: 47.7 %
Single Plane A4C EF: 39.4 %
Weight: 4938.3 oz

## 2022-01-31 LAB — CBC
HCT: 38.5 % — ABNORMAL LOW (ref 39.0–52.0)
Hemoglobin: 12.5 g/dL — ABNORMAL LOW (ref 13.0–17.0)
MCH: 30.4 pg (ref 26.0–34.0)
MCHC: 32.5 g/dL (ref 30.0–36.0)
MCV: 93.7 fL (ref 80.0–100.0)
Platelets: 284 10*3/uL (ref 150–400)
RBC: 4.11 MIL/uL — ABNORMAL LOW (ref 4.22–5.81)
RDW: 13.1 % (ref 11.5–15.5)
WBC: 9.7 10*3/uL (ref 4.0–10.5)
nRBC: 0 % (ref 0.0–0.2)

## 2022-01-31 LAB — TROPONIN I (HIGH SENSITIVITY): Troponin I (High Sensitivity): 60 ng/L — ABNORMAL HIGH (ref <18)

## 2022-01-31 LAB — GLUCOSE, CAPILLARY: Glucose-Capillary: 152 mg/dL — ABNORMAL HIGH (ref 70–99)

## 2022-01-31 LAB — BRAIN NATRIURETIC PEPTIDE: B Natriuretic Peptide: 107.8 pg/mL — ABNORMAL HIGH (ref 0.0–100.0)

## 2022-01-31 MED ORDER — DILTIAZEM LOAD VIA INFUSION
30.0000 mg | Freq: Once | INTRAVENOUS | Status: AC
  Administered 2022-01-31: 30 mg via INTRAVENOUS
  Filled 2022-01-31: qty 30

## 2022-01-31 MED ORDER — HEPARIN (PORCINE) 25000 UT/250ML-% IV SOLN
1800.0000 [IU]/h | INTRAVENOUS | Status: DC
Start: 2022-01-31 — End: 2022-02-03
  Administered 2022-01-31: 1550 [IU]/h via INTRAVENOUS
  Administered 2022-02-01 – 2022-02-03 (×4): 1800 [IU]/h via INTRAVENOUS
  Filled 2022-01-31 (×5): qty 250

## 2022-01-31 MED ORDER — DILTIAZEM HCL-DEXTROSE 125-5 MG/125ML-% IV SOLN (PREMIX)
5.0000 mg/h | INTRAVENOUS | Status: DC
  Administered 2022-01-31: 5 mg/h via INTRAVENOUS
  Administered 2022-01-31: 10 mg/h via INTRAVENOUS
  Administered 2022-02-01: 5 mg/h via INTRAVENOUS
  Filled 2022-01-31: qty 125

## 2022-01-31 MED ORDER — PERFLUTREN LIPID MICROSPHERE
1.0000 mL | INTRAVENOUS | Status: AC | PRN
  Administered 2022-01-31: 2 mL via INTRAVENOUS
  Filled 2022-01-31: qty 10

## 2022-01-31 MED ORDER — ASPIRIN 325 MG PO TABS
325.0000 mg | ORAL_TABLET | Freq: Every day | ORAL | Status: DC
  Administered 2022-02-01: 325 mg via ORAL
  Filled 2022-01-31: qty 1

## 2022-01-31 MED ORDER — SIMETHICONE 80 MG PO CHEW
160.0000 mg | CHEWABLE_TABLET | Freq: Once | ORAL | Status: AC
  Administered 2022-01-31: 160 mg via ORAL
  Filled 2022-01-31: qty 2

## 2022-01-31 MED ORDER — HEPARIN BOLUS VIA INFUSION
5500.0000 [IU] | Freq: Once | INTRAVENOUS | Status: AC
  Administered 2022-01-31: 5500 [IU] via INTRAVENOUS
  Filled 2022-01-31: qty 5500

## 2022-01-31 NOTE — Progress Notes (Signed)
Admission profile updated. ?

## 2022-01-31 NOTE — Assessment & Plan Note (Addendum)
Unknown type, concerned about systolic dysfunction. ?Follow 2D echo. ?Has diuresed well after IV Lasix 40 mg x 1, put out 1.1 L, -860 mL ?Continue IV Lasix 40 mg twice daily. ?Further GDMT based on echo results. ?For elevated D-dimer, underwent CTA chest which was negative for PE.  No clinical pneumonia or COPD exacerbation. ?

## 2022-01-31 NOTE — Assessment & Plan Note (Addendum)
Lisinopril held due to AKI. ?Toprol-XL initiated today and possibly starting losartan in AM. ?

## 2022-01-31 NOTE — Assessment & Plan Note (Signed)
Suspecting demand ischemia related to acute CHF. ?Follow 2D echo. ?No anginal symptoms. ?

## 2022-01-31 NOTE — Hospital Course (Addendum)
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  Seen by his PCP on day of admission, sent for outpatient blood work, given a prescription for Lasix which she had not started but presented to the ED after D-dimer and troponin came back elevated.  Admitted for suspected new onset acute systolic CHF.  Course complicated by new onset A-fib with RVR and AKI related to diuresis.  Cardiology consulted.  Clinically improved.  Cardiology has seen and cleared for discharge.  They will arrange outpatient follow-up. ?

## 2022-01-31 NOTE — Consult Note (Signed)
? ?  Heart Failure Nurse Navigator Note ? ?HF echocardiogram results are pending at this time. ? ?He presented to the emergency room with complaints of weight gain, increasing shortness of breath, orthopnea and bilateral pedal edema. ? ?Comorbidities: ? ?Hypertension ?Type 2 diabetes ?Obesity ? ?Medications: ? ?Furosemide 40 mg IV every 12 hours ?Lisinopril 20 mg daily ?Crestor 20 mg daily ?Proscar 5 mg daily ? ?Labs: ? ?Sodium 138, potassium 4.4, chloride 101, CO2 31, BUN 17, creatinine 0.99, magnesium 2.0, BNP 109 and 107. ?Weight is 140 kg ?Blood pressure 133/64 ? ? ?Initial meeting with patient in the emergency room.  He is lying on a gurney in no acute distress.  There is no family present at the bedside. ? ? ?Discussed diastolic and systolic heart failure.  He states that his echocardiogram had just been completed but not resulted as of present time. ? ?He states at home that he had gotten up to a weight of 330 pounds and was working on losing weight and got down to 299 but  in the last 2 weeks he went  back up to 309.  He also states lately he had been eating more potato chips which he realizes are higher in sodium. ? ?He states that his wife does not cook anymore so they do use convenience foods and more eating in restaurants.  Discussed  making wise choices when eating at a restaurant, also with convenience foods. ? ?Discussed the importance of fluid restriction of 64 ounces and the relationship with sodium intake and function of the heart. ? ?Went over the zone magnet and the living with heart failure teaching booklet. ? ?Also talked about his following up in the outpatient heart failure clinic he has an appointment on March 3 at 9 AM.  He has a 13% of no-show which is 4 out of 32 appointments. ? ?He had no further questions. ? ?Tresa Endo RN [facility] ?

## 2022-01-31 NOTE — Consult Note (Signed)
ANTICOAGULATION CONSULT NOTE - Initial Consult ? ?Pharmacy Consult for heparin infusion ?Indication: atrial fibrillation ? ?No Known Allergies ? ?Patient Measurements: ?Height: 6' (182.9 cm) ?Weight: (!) 140 kg (308 lb 10.3 oz) ?IBW/kg (Calculated) : 77.6 ?Heparin Dosing Weight: 109.9 kg  ? ?Vital Signs: ?Temp: 98.2 ?F (36.8 ?C) (03/10 2047) ?Temp Source: Oral (03/10 1602) ?BP: 124/87 (03/10 2138) ?Pulse Rate: 69 (03/10 2047) ? ?Labs: ?Recent Labs  ?  01/30/22 ?1344 01/30/22 ?1654 01/30/22 ?1844 01/31/22 ?0981  ?HGB 12.8*  --   --  12.5*  ?HCT 37.7*  --   --  38.5*  ?PLT 285.0  --   --  284  ?LABPROT  --  14.1  --   --   ?INR  --  1.1  --   --   ?CREATININE 0.83  --   --  0.99  ?TROPONINIHS  --  43* 44*  --   ? ? ?Estimated Creatinine Clearance: 95 mL/min (by C-G formula based on SCr of 0.99 mg/dL). ? ? ?Medical History: ?Past Medical History:  ?Diagnosis Date  ? Dizziness   ? Elevated blood pressure   ? ? ?Medications:  ?Enoxaparin 70 mg SQ Q24H, last dose 3/9 2300 ? ?Assessment: ?76 year old male, presented to the ED 01/30/22 with 1 week history of progressive exertional dyspnea, orthopnea found to have new onset CHF (EF 40-45%). New onset Afib developed 01/31/22 evening. Pharmacy has been consulted for initiation and management of heparin infusion ? ?Goal of Therapy:  ?Heparin level 0.3-0.7 units/ml ?Monitor platelets by anticoagulation protocol: Yes ?  ?Plan:  ?Give 5500 units bolus x 1 ?Start heparin infusion at 1550 units/hr ?Check anti-Xa level in 8 hours and daily while on heparin ?Continue to monitor H&H and platelets ? ?Sharen Hones, PharmD, BCPS ?Clinical Pharmacist   ?01/31/2022,9:47 PM ? ? ?

## 2022-01-31 NOTE — ED Notes (Signed)
Informed RN bed assigned 

## 2022-01-31 NOTE — Progress Notes (Signed)
PROGRESS NOTE   Adam HessRoger Leon Jennings  ZOX:[id number]    DOB: 08/16/1946    DOA: 01/30/2022  PCP: Doreene Nestlark, [name], NP   I have briefly reviewed patients previous medical records in [facility].  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  Seen by his PCP on day of admission, sent for outpatient blood work, given a prescription for Lasix which she had not started but presented to the ED after D-dimer and troponin came back elevated.  Admitted for suspected new onset CHF.   Assessment & Plan:  Assessment and Plan: * Acute CHF (congestive heart failure) (HCC) Unknown type, concerned about systolic dysfunction. Follow 2D echo. Has diuresed well after IV Lasix 40 mg x 1, put out 1.1 L, -860 mL Continue IV Lasix 40 mg twice daily. Further GDMT based on echo results. For elevated D-dimer, underwent CTA chest which was negative for PE.  No clinical pneumonia or COPD exacerbation.  Essential hypertension Continue lisinopril.  Hyperlipidemia Continue rosuvastatin.  Type II diabetes mellitus (HCC) A1c 7.8 Monitor CBGs and SSI for now.  May need to add maintenance insulins.  Elevated troponin Suspecting demand ischemia related to acute CHF. Follow 2D echo. No anginal symptoms.   Body mass index is 41.86 kg/m./Obesity Nutritional Status       Pressure Ulcer:    DVT prophylaxis:   Lovenox   Code Status: Full Code:  Family Communication: None at bedside Disposition:  Status is: Observation The patient will require care spanning > 2 midnights and should be moved to inpatient because: Severity of illness, IV meds    Consultants:     Procedures:      Antimicrobials:      Subjective:  Seen early this morning while still in ED.  Reports actually never having chest pain.  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  For the last several days has been sleeping in a chair because unable to lie flat.  Since hospital admission, says that he has urinated well (emptied 3.5 full urinals) and dyspnea has significantly improved but not resolved  Objective:   Vitals:   01/31/22 0000 01/31/22 0100 01/31/22 0300 01/31/22 0600  BP: 133/65 127/70 (!) 129/56 (!) 126/48  Pulse: 70 68 65 75  Resp: 20 (!) 21 19 20   Temp:      TempSrc:      SpO2: 92% 91% 93% 92%  Weight:      Height:        General exam: Elderly male, moderately built and obese lying comfortably propped up in bed without distress.  Appears to have mild DOE even when sitting up in bed. Respiratory system: Diminished breath sounds in the bases with few fine bibasilar crackles. Cardiovascular system: S1 & S2 heard, RRR. No JVD, murmurs, rubs, gallops or clicks.  1+ pitting bilateral leg edema. Gastrointestinal system: Abdomen is nondistended, soft and nontender. No organomegaly or masses felt. Normal bowel sounds heard. Central nervous system: Alert and oriented. No focal neurological deficits. Extremities: Symmetric 5 x 5 power. Skin: No rashes, lesions or ulcers Psychiatry: Judgement and insight appear normal. Mood & affect appropriate.     Data Reviewed:  I have personally reviewed following labs and imaging studies   CBC: Recent Labs  Lab 01/30/22 1344 01/31/22 0508  WBC 9.4 9.7  NEUTROABS 6.4  --   HGB 12.8* 12.5*  HCT 37.7* 38.5*  MCV 91.0 93.7  PLT 285.0 284    Basic Metabolic Panel: Recent Labs  Lab 01/30/22 1344 01/31/22 0508  NA 136 138  K 4.6 4.4  CL 101 101  CO2 30 31  GLUCOSE 166* 186*  BUN 17 17  CREATININE 0.83 0.99  CALCIUM 9.5 8.8*  MG  --  2.0    Liver Function Tests: Recent  Labs  Lab 01/30/22 1344  AST 21  ALT 21  ALKPHOS 47  BILITOT 0.7  PROT 7.5  ALBUMIN 4.1    CBG: Recent Labs  Lab 01/30/22 2230  GLUCAP 185*    Microbiology Studies:   Recent Results (from the past 240 hour(s))  Resp Panel by RT-PCR (Flu A&B, Covid) Nasopharyngeal Swab     Status: None   Collection Time: 01/30/22  7:12 PM   Specimen: Nasopharyngeal Swab; Nasopharyngeal(NP) swabs in vial transport medium  Result Value Ref Range Status   SARS Coronavirus 2 by RT PCR NEGATIVE NEGATIVE Final    Comment: (NOTE) SARS-CoV-2 target nucleic acids are NOT DETECTED.  The SARS-CoV-2 RNA is generally detectable in upper respiratory specimens during the acute phase of infection. The lowest concentration of SARS-CoV-2 viral copies this assay can detect is 138 copies/mL. A negative result does not preclude SARS-Cov-2 infection and should not be used as the sole basis for treatment or other patient management decisions. A negative result may occur with  improper specimen collection/handling, submission of specimen other than nasopharyngeal swab, presence of viral mutation(s) within the areas targeted by this assay, and inadequate number of viral copies(<138 copies/mL). A negative result must be combined with clinical observations, patient history, and epidemiological information. The expected result is Negative.  Fact Sheet for Patients:  BloggerCourse.com  Fact Sheet for Healthcare Providers:  SeriousBroker.it  This test is no t yet approved or cleared by the [place] FDA and  has been authorized for detection and/or diagnosis of SARS-CoV-2 by FDA under an Emergency Use Authorization (EUA). This EUA will remain  in effect (meaning this test can be used) for the duration of the COVID-19 declaration under Section 564(b)(1) of the Act, 21 U.S.C.section 360bbb-3(b)(1), unless the authorization is terminated  or revoked sooner.        Influenza A by PCR NEGATIVE NEGATIVE Final   Influenza B by PCR NEGATIVE NEGATIVE Final    Comment: (NOTE) The Xpert Xpress SARS-CoV-2/FLU/RSV plus assay is intended as an aid in the diagnosis of influenza from Nasopharyngeal swab specimens and should not be used as a sole basis for treatment. Nasal washings and aspirates are unacceptable for Xpert Xpress SARS-CoV-2/FLU/RSV testing.  Fact Sheet for Patients: BloggerCourse.com  Fact Sheet for Healthcare Providers: SeriousBroker.it  This test is not yet approved or cleared by the [place] FDA and has been authorized for detection and/or diagnosis of SARS-CoV-2 by FDA under an Emergency Use Authorization (EUA). This EUA will remain in effect (meaning this test can be used) for the duration of the COVID-19 declaration under Section 564(b)(1) of the Act, 21 U.S.C. section 360bbb-3(b)(1), unless the authorization is terminated or revoked.  Performed at [facility], [address], [place], [place] [address]     Radiology Studies:  DG Chest 2 View  Result Date: 01/30/2022 CLINICAL DATA:  Dyspnea on  exertion. EXAM: CHEST - 2 VIEW COMPARISON:  None. FINDINGS: Mild cardiomegaly is noted. Left lung is clear. Elevated right hemidiaphragm is noted. Mild right basilar atelectasis is noted with small pleural effusion. Bony thorax is unremarkable. IMPRESSION: Elevated right hemidiaphragm. Mild right basilar atelectasis is noted with small right pleural effusion. Electronically Signed   By: Lupita Raider M.D.   On: 01/30/2022 21:22   CT Angio Chest PE W and/or Wo Contrast  Result Date: 01/30/2022 CLINICAL DATA:  Pulmonary embolism suspected, D-dimer positive. Chest pain which radiates. EXAM: CT ANGIOGRAPHY CHEST WITH CONTRAST TECHNIQUE: Multidetector CT imaging of the chest was performed using the standard protocol during bolus administration of intravenous contrast.  Multiplanar CT image reconstructions and MIPs were obtained to evaluate the vascular anatomy. RADIATION DOSE REDUCTION: This exam was performed according to the departmental dose-optimization program which includes automated exposure control, adjustment of the mA and/or kV according to patient size and/or use of iterative reconstruction technique. CONTRAST:  OMNIPAQUE IOHEXOL 350 MG/ML SOLN COMPARISON:  None. FINDINGS: Cardiovascular: Satisfactory opacification of the pulmonary arteries to the segmental level. No evidence of pulmonary embolism. Aortic atherosclerosis without aneurysmal dilation. Mild left-sided cardiac enlargement. Small pericardial effusion. Mediastinum/Nodes: No supraclavicular adenopathy. No discrete thyroid nodule. No pathologically enlarged mediastinal, hilar or axillary lymph nodes. Lungs/Pleura: Diffuse bilateral bronchial wall thickening. Small right-sided pleural effusion with adjacent consolidation. Trace left pleural effusion with adjacent consolidation. Linear opacities in the right middle lobe and right lower lobe are favored to reflect atelectasis. Scattered right greater than left gas-filled cystic spaces may reflect pneumatoceles. Upper Abdomen: No acute abnormality. Musculoskeletal: Thoracic diffuse idiopathic skeletal hyperostosis. No acute osseous abnormality. Review of the MIP images confirms the above findings. IMPRESSION: 1. No evidence of acute pulmonary embolism. 2. Small right and trace left pleural effusions with adjacent consolidations which may reflect atelectasis versus infiltrate. 3. Diffuse bilateral bronchial wall thickening with linear atelectasis in the right middle and right lower lobes. 4. Small pericardial effusion. 5.  Aortic Atherosclerosis (ICD10-I70.0). Electronically Signed   By: Maudry Mayhew M.D.   On: 01/30/2022 18:56    Scheduled Meds:    enoxaparin (LOVENOX) injection  0.5 mg/kg Subcutaneous Q24H   finasteride  5 mg Oral Daily   furosemide   40 mg Intravenous Q12H   insulin aspart  0-6 Units Subcutaneous TID WC   lisinopril  20 mg Oral Daily   rosuvastatin  20 mg Oral Daily   sodium chloride flush  3 mL Intravenous Q12H    Continuous Infusions:     LOS: 0 days     Marcellus Scott, MD,  FACP, [facility], [facility], [facility] (Care Management Physician Certified) [facility] [place]  To contact the attending provider between 7A-7P or the covering provider during after hours 7P-7A, please log into the web site [email or web address] and access using universal [place] password for that web site. If you do not have the password, please call the hospital operator.  01/31/2022, 8:32 AM

## 2022-01-31 NOTE — Progress Notes (Signed)
? ?  Patient; Adam Jennings             ?SWH:[id number]    DOB: [birth year]    DOA: 01/30/2022 ?  ? ?Subjective: Nurse reports heart rate 140-150. Bedside c/o indigestion but no true chest pain but did recognize discomfort was worse when lying back in bed and improved sitting edge of bed and after burping. ?Patient admitted new onset heart failure .  Patient denies history of irregular heart rhythm or heart palpitations ? ?Objective: ?Vitals:  ? 01/31/22 2214 01/31/22 2223  ?BP: 101/60 121/64  ?Pulse: (!) 112   ?Resp:  20  ?Temp:    ?SpO2:    ? ?Over 2.5 L negative fluid balance ? ?Assessment:  ?EKG evaluated by me a fib with RVR. R BBB and LAFB - no image of prior EKG in Epic or in physical chart.  EKG evaluated by Dr Erma Heritage in ED yesterday NSR without mention of evidence of block ?BNP only mildly elevated on admission 104, and troponin slightly elevated but flat 43-42 ?with echo today showing reduced EF 40-45%, LVH, left ventricle global hypokinesis, Grade I diastolic dysfunction ? ?Plan:  ?Heparin ?Repeat troponin level flat 60-61 ?Rate control attempted with diltiazem ineffective. Changed to amio.. Rate  improved 1teens ?  ?

## 2022-01-31 NOTE — Assessment & Plan Note (Signed)
Continue rosuvastatin.  

## 2022-01-31 NOTE — Progress Notes (Signed)
*  PRELIMINARY RESULTS* ?Echocardiogram ?2D Echocardiogram has been performed. ? ?Adam Jennings Adam Jennings ?01/31/2022, 1:33 PM ?

## 2022-01-31 NOTE — Assessment & Plan Note (Addendum)
A1c 7.8 ?Treated in the hospital with SSI. ?Consider Farxiga as outpatient. ?Initiated metformin 500 Mg daily. ?Reasonable inpatient control. ?

## 2022-02-01 DIAGNOSIS — I5021 Acute systolic (congestive) heart failure: Secondary | ICD-10-CM | POA: Diagnosis present

## 2022-02-01 DIAGNOSIS — N179 Acute kidney failure, unspecified: Secondary | ICD-10-CM

## 2022-02-01 DIAGNOSIS — I4891 Unspecified atrial fibrillation: Secondary | ICD-10-CM

## 2022-02-01 LAB — BASIC METABOLIC PANEL
Anion gap: 11 (ref 5–15)
BUN: 27 mg/dL — ABNORMAL HIGH (ref 8–23)
CO2: 28 mmol/L (ref 22–32)
Calcium: 8.9 mg/dL (ref 8.9–10.3)
Chloride: 96 mmol/L — ABNORMAL LOW (ref 98–111)
Creatinine, Ser: 1.52 mg/dL — ABNORMAL HIGH (ref 0.61–1.24)
GFR, Estimated: 48 mL/min — ABNORMAL LOW (ref >60)
Glucose, Bld: 192 mg/dL — ABNORMAL HIGH (ref 70–99)
Potassium: 4.2 mmol/L (ref 3.5–5.1)
Sodium: 135 mmol/L (ref 135–145)

## 2022-02-01 LAB — CBC
HCT: 41 % (ref 39.0–52.0)
Hemoglobin: 13.7 g/dL (ref 13.0–17.0)
MCH: 30 pg (ref 26.0–34.0)
MCHC: 33.4 g/dL (ref 30.0–36.0)
MCV: 89.7 fL (ref 80.0–100.0)
Platelets: 282 10*3/uL (ref 150–400)
RBC: 4.57 MIL/uL (ref 4.22–5.81)
RDW: 13.2 % (ref 11.5–15.5)
WBC: 11.1 10*3/uL — ABNORMAL HIGH (ref 4.0–10.5)
nRBC: 0 % (ref 0.0–0.2)

## 2022-02-01 LAB — GLUCOSE, CAPILLARY
Glucose-Capillary: 180 mg/dL — ABNORMAL HIGH (ref 70–99)
Glucose-Capillary: 189 mg/dL — ABNORMAL HIGH (ref 70–99)
Glucose-Capillary: 171 mg/dL — ABNORMAL HIGH (ref 70–99)
Glucose-Capillary: 181 mg/dL — ABNORMAL HIGH (ref 70–99)

## 2022-02-01 LAB — HEPARIN LEVEL (UNFRACTIONATED)
Heparin Unfractionated: 0.1 IU/mL — ABNORMAL LOW (ref 0.30–0.70)
Heparin Unfractionated: 0.36 IU/mL (ref 0.30–0.70)

## 2022-02-01 LAB — TROPONIN I (HIGH SENSITIVITY): Troponin I (High Sensitivity): 61 ng/L — ABNORMAL HIGH (ref <18)

## 2022-02-01 LAB — MAGNESIUM: Magnesium: 2 mg/dL (ref 1.7–2.4)

## 2022-02-01 MED ORDER — HEPARIN BOLUS VIA INFUSION
3300.0000 [IU] | Freq: Once | INTRAVENOUS | Status: AC
  Administered 2022-02-01: 3300 [IU] via INTRAVENOUS
  Filled 2022-02-01: qty 3300

## 2022-02-01 MED ORDER — AMIODARONE HCL IN DEXTROSE 360-4.14 MG/200ML-% IV SOLN
30.0000 mg/h | INTRAVENOUS | Status: DC
Start: 2022-02-01 — End: 2022-02-02
  Administered 2022-02-01 – 2022-02-02 (×3): 30 mg/h via INTRAVENOUS
  Filled 2022-02-01 (×2): qty 200

## 2022-02-01 MED ORDER — AMIODARONE LOAD VIA INFUSION
150.0000 mg | Freq: Once | INTRAVENOUS | Status: AC
Start: 2022-02-01 — End: 2022-02-01
  Administered 2022-02-01: 150 mg via INTRAVENOUS
  Filled 2022-02-01: qty 83.34

## 2022-02-01 MED ORDER — ASPIRIN 81 MG PO CHEW
81.0000 mg | CHEWABLE_TABLET | Freq: Every day | ORAL | Status: DC
  Administered 2022-02-02 – 2022-02-03 (×2): 81 mg via ORAL
  Filled 2022-02-01 (×2): qty 1

## 2022-02-01 MED ORDER — AMIODARONE HCL IN DEXTROSE 360-4.14 MG/200ML-% IV SOLN
60.0000 mg/h | INTRAVENOUS | Status: DC
  Administered 2022-02-01 (×2): 60 mg/h via INTRAVENOUS
  Filled 2022-02-01 (×2): qty 200

## 2022-02-01 NOTE — Assessment & Plan Note (Addendum)
Presented with creatinine of 0.83 ?Worsened to 1.52 on 3/11 in the context of IV Lasix, lisinopril, IV contrast on admission and hypotension. ?Discontinued IV Lasix and lisinopril. ?Resolved. ?Follow-up BMP as outpatient. ?

## 2022-02-01 NOTE — Assessment & Plan Note (Addendum)
New onset ?2D echo 3/10: LVEF 40-45% and LV global hypokinesis.  Grade 1 diastolic dysfunction. ?Treated with IV Lasix 40 mg twice daily 48 hours, developed AKI and IV Lasix discontinued. ?-2.5 L thus far and weight down from 308 pounds on admission to 295 pounds on day of discharge. ?For elevated D-dimer, underwent CTA chest which was negative for PE.  No clinical pneumonia or COPD exacerbation. ?Clinically improved and euvolemic. ?Cardiology follow-up appreciated.  Toprol-XL 12.5 Mg daily initiated.  ?Cardiology has seen today.  Have initiated home dose of Lasix 20 mg daily.  Have initiated losartan 25 Mg daily, cleared him for discharge and will arrange outpatient follow-up. ?

## 2022-02-01 NOTE — Consult Note (Signed)
[facility]  CARDIOLOGY CONSULT NOTE  Patient ID: Adam Jennings MRN: [id number] DOB/AGE: 1946-04-17 76 y.o.  Admit date: 01/30/2022 Referring Physician Marcellus Scott Primary Physician Doreene Nest, NP Primary Cardiologist NA Reason for Consultation New diagnosis HFrEF  HPI:  Adam Jennings is a 76 year old male with a history of hypertension, HLD, obesity who presented to the emergency department with shortness of breath.  He was discovered to have newly decreased ejection fraction of 40 to 45% prompting cardiology consultation.  He reportedly was in his usual state of health until the last few weeks, but over the prior 3 days to coming in he was having pain in his neck and back which improved with pain medication. He also described worsening shortness of breath with walking, and some mild issue at rest. He had very mild LE edema. He had significant orthopnea and was sleeping upright in bed for a few days prior to admission. No chest pain.    On arrival to the emergency department he had a heart rate in the 60s and a blood pressure of 138/98.  He was on room air.  His troponin was mildly elevated to 40, and the BNP was elevated to 107.  EKG showed sinus rhythm with PACs and a bifascicular block, as well as LVH with repolarization.  Social - Prior cigar smoker, quit 40 years ago  Review of systems complete and found to be negative unless listed above     Past Medical History:  Diagnosis Date   Dizziness    Elevated blood pressure     Past Surgical History:  Procedure Laterality Date   CATARACT EXTRACTION W/ INTRAOCULAR LENS IMPLANT Right 11/05/2017   CATARACT EXTRACTION W/ INTRAOCULAR LENS IMPLANT Left 12/08/2017    Medications Prior to Admission  Medication Sig Dispense Refill Last Dose   finasteride (PROSCAR) 5 MG tablet TAKE 1 TABLET (5 MG TOTAL) BY MOUTH DAILY. 90 tablet 3 01/30/2022   furosemide (LASIX) 20 MG tablet Take 1 tablet (20 mg total) by mouth daily. 20 tablet  0 01/30/2022   lisinopril (ZESTRIL) 20 MG tablet Take 1 tablet (20 mg total) by mouth daily. For blood pressure. 90 tablet 3 01/30/2022   meloxicam (MOBIC) 15 MG tablet Take 15 mg by mouth daily.   01/30/2022   Omega-3 Fatty Acids (FISH OIL) 1000 MG CAPS Take 1,000 mg by mouth daily.   01/30/2022   rosuvastatin (CRESTOR) 20 MG tablet Take 1 tablet (20 mg total) by mouth daily. For cholesterol. Have patient call the office! 90 tablet 0 01/30/2022   Social History   Socioeconomic History   Marital status: Married    Spouse name: Not on file   Number of children: Not on file   Years of education: Not on file   Highest education level: Not on file  Occupational History   Not on file  Tobacco Use   Smoking status: Never    Passive exposure: Never   Smokeless tobacco: Never  Vaping Use   Vaping Use: Never used  Substance and Sexual Activity   Alcohol use: No    Alcohol/week: 0.0 standard drinks   Drug use: No   Sexual activity: Not Currently  Other Topics Concern   Not on file  Social History Narrative   Married.   1 daughter, 4 grandchildren.   Works in [employer] in [place].   Enjoys working on tractors.    Social Determinants of Health   Financial Resource Strain: Not on file  Food Insecurity: Not  on file  Transportation Needs: Not on file  Physical Activity: Not on file  Stress: Not on file  Social Connections: Not on file  Intimate Partner Violence: Not on file    Family History  Problem Relation Age of Onset   Hypertension Father       Review of systems complete and found to be negative unless listed above      PHYSICAL EXAM  Vitals:   02/01/22 0738 02/01/22 1027  BP: (!) 82/58 92/60  Pulse: 69   Resp: 18 (!) 22  Temp: 98 F (36.7 C)   SpO2: 94%      General: Well developed, well nourished, in no acute distress HEENT:  Normocephalic and atramatic Neck:  No JVD.  Lungs: Clear bilaterally to auscultation and percussion. Heart: HRRR . Normal S1 and S2 without  gallops or murmurs.  Abdomen: Bowel sounds are positive, abdomen soft and non-tender  Msk:  Back normal, normal gait. Normal strength and tone for age. Extremities: No clubbing, cyanosis or edema.   Neuro: Alert and oriented X 3. Psych:  Good affect, responds appropriately  Labs:   Lab Results  Component Value Date   WBC 11.1 (H) 02/01/2022   HGB 13.7 02/01/2022   HCT 41.0 02/01/2022   MCV 89.7 02/01/2022   PLT 282 02/01/2022    Recent Labs  Lab 01/30/22 1344 01/31/22 0508 02/01/22 0726  NA 136   < > 135  K 4.6   < > 4.2  CL 101   < > 96*  CO2 30   < > 28  BUN 17   < > 27*  CREATININE 0.83   < > 1.52*  CALCIUM 9.5   < > 8.9  PROT 7.5  --   --   BILITOT 0.7  --   --   ALKPHOS 47  --   --   ALT 21  --   --   AST 21  --   --   GLUCOSE 166*   < > 192*   < > = values in this interval not displayed.   Lab Results  Component Value Date   TROPONINI <0.03 02/12/2016    Lab Results  Component Value Date   CHOL 177 11/26/2021   CHOL 172 10/23/2020   CHOL 149 08/14/2020   Lab Results  Component Value Date   HDL 32.50 (L) 11/26/2021   HDL 39.80 10/23/2020   HDL 30.40 (L) 08/14/2020   Lab Results  Component Value Date   LDLCALC 123 (H) 11/26/2021   LDLCALC 115 (H) 10/23/2020   LDLCALC 102 (H) 08/14/2020   Lab Results  Component Value Date   TRIG 105.0 11/26/2021   TRIG 86.0 10/23/2020   TRIG 84.0 08/14/2020   Lab Results  Component Value Date   CHOLHDL 5 11/26/2021   CHOLHDL 4 10/23/2020   CHOLHDL 5 08/14/2020   No results found for: LDLDIRECT    Radiology: DG Chest 2 View  Result Date: 01/30/2022 CLINICAL DATA:  Dyspnea on exertion. EXAM: CHEST - 2 VIEW COMPARISON:  None. FINDINGS: Mild cardiomegaly is noted. Left lung is clear. Elevated right hemidiaphragm is noted. Mild right basilar atelectasis is noted with small pleural effusion. Bony thorax is unremarkable. IMPRESSION: Elevated right hemidiaphragm. Mild right basilar atelectasis is noted with small  right pleural effusion. Electronically Signed   By: Lupita Raider M.D.   On: 01/30/2022 21:22   CT Angio Chest PE W and/or Wo Contrast  Result Date: 01/30/2022 CLINICAL  DATA:  Pulmonary embolism suspected, D-dimer positive. Chest pain which radiates. EXAM: CT ANGIOGRAPHY CHEST WITH CONTRAST TECHNIQUE: Multidetector CT imaging of the chest was performed using the standard protocol during bolus administration of intravenous contrast. Multiplanar CT image reconstructions and MIPs were obtained to evaluate the vascular anatomy. RADIATION DOSE REDUCTION: This exam was performed according to the departmental dose-optimization program which includes automated exposure control, adjustment of the mA and/or kV according to patient size and/or use of iterative reconstruction technique. CONTRAST:  OMNIPAQUE IOHEXOL 350 MG/ML SOLN COMPARISON:  None. FINDINGS: Cardiovascular: Satisfactory opacification of the pulmonary arteries to the segmental level. No evidence of pulmonary embolism. Aortic atherosclerosis without aneurysmal dilation. Mild left-sided cardiac enlargement. Small pericardial effusion. Mediastinum/Nodes: No supraclavicular adenopathy. No discrete thyroid nodule. No pathologically enlarged mediastinal, hilar or axillary lymph nodes. Lungs/Pleura: Diffuse bilateral bronchial wall thickening. Small right-sided pleural effusion with adjacent consolidation. Trace left pleural effusion with adjacent consolidation. Linear opacities in the right middle lobe and right lower lobe are favored to reflect atelectasis. Scattered right greater than left gas-filled cystic spaces may reflect pneumatoceles. Upper Abdomen: No acute abnormality. Musculoskeletal: Thoracic diffuse idiopathic skeletal hyperostosis. No acute osseous abnormality. Review of the MIP images confirms the above findings. IMPRESSION: 1. No evidence of acute pulmonary embolism. 2. Small right and trace left pleural effusions with adjacent  consolidations which may reflect atelectasis versus infiltrate. 3. Diffuse bilateral bronchial wall thickening with linear atelectasis in the right middle and right lower lobes. 4. Small pericardial effusion. 5.  Aortic Atherosclerosis (ICD10-I70.0). Electronically Signed   By: Maudry Mayhew M.D.   On: 01/30/2022 18:56   ECHOCARDIOGRAM COMPLETE  Result Date: 01/31/2022    ECHOCARDIOGRAM REPORT   Patient Name:   KAYLEN NGHIEM Date of Exam: 01/31/2022 Medical Rec #:  [id number]        Height:       72.0 in Accession #:    [id number]       Weight:       308.6 lb Date of Birth:  1946-11-21         BSA:          2.562 m Patient Age:    76 years         BP:           133/63 mmHg Patient Gender: M                HR:           68 bpm. Exam Location:  [facility] Procedure: 2D Echo, Color Doppler, Cardiac Doppler and Intracardiac            Opacification Agent Indications:     R06.00 Dyspnea ; Elevated troponin  History:         Patient has no prior history of Echocardiogram examinations.                  Signs/Symptoms:Dizziness/Lightheadedness and Shortness of                  Breath; Risk Factors:Hypertension and Diabetes.  Sonographer:     Humphrey Rolls Referring Phys:  [id number] Theadora Rama D HONGALGI Diagnosing Phys: Sena Slate  Sonographer Comments: Suboptimal apical window and no subcostal window. Image acquisition challenging due to patient body habitus. IMPRESSIONS  1. Left ventricular ejection fraction, by estimation, is 40 to 45%. The left ventricle has mildly decreased function. The left ventricle demonstrates global hypokinesis. The left ventricular internal cavity size was mildly dilated. There is  mild left ventricular hypertrophy. Left ventricular diastolic parameters are consistent with Grade I diastolic dysfunction (impaired relaxation).  2. Right ventricular systolic function was not well visualized. The right ventricular size is not well visualized.  3. The mitral valve is normal in structure. Mild mitral valve  regurgitation. No evidence of mitral stenosis.  4. The aortic valve is normal in structure. Aortic valve regurgitation is not visualized. No aortic stenosis is present.  5. Aortic dilatation noted. There is mild dilatation of the aortic root, measuring 38 mm. Conclusion(s)/Recommendation(s): Technically difficult study. Likely mild LV dysfunction. Unable to assess RV. FINDINGS  Left Ventricle: Left ventricular ejection fraction, by estimation, is 40 to 45%. The left ventricle has mildly decreased function. The left ventricle demonstrates global hypokinesis. Definity contrast agent was given IV to delineate the left ventricular  endocardial borders. The left ventricular internal cavity size was mildly dilated. There is mild left ventricular hypertrophy. Left ventricular diastolic parameters are consistent with Grade I diastolic dysfunction (impaired relaxation). Right Ventricle: The right ventricular size is not well visualized. Right ventricular systolic function was not well visualized. Left Atrium: Left atrial size was normal in size. Right Atrium: Right atrial size was not well visualized. Pericardium: There is no evidence of pericardial effusion. Mitral Valve: The mitral valve is normal in structure. Mild mitral valve regurgitation. No evidence of mitral valve stenosis. MV peak gradient, 4.8 mmHg. The mean mitral valve gradient is 2.0 mmHg. Tricuspid Valve: The tricuspid valve is not well visualized. Aortic Valve: The aortic valve is normal in structure. Aortic valve regurgitation is not visualized. No aortic stenosis is present. Aortic valve mean gradient measures 5.0 mmHg. Aortic valve peak gradient measures 10.2 mmHg. Aortic valve area, by VTI measures 3.47 cm. Pulmonic Valve: The pulmonic valve was not well visualized. Pulmonic valve regurgitation is not visualized. No evidence of pulmonic stenosis. Aorta: Aortic dilatation noted. There is mild dilatation of the aortic root, measuring 38 mm. IAS/Shunts: The  interatrial septum was not well visualized.  LEFT VENTRICLE PLAX 2D LVIDd:         5.96 cm      Diastology LVIDs:         5.06 cm      LV e' medial:    5.98 cm/s LV PW:         1.24 cm      LV E/e' medial:  14.0 LV IVS:        1.06 cm      LV e' lateral:   8.59 cm/s LVOT diam:     2.60 cm      LV E/e' lateral: 9.8 LV SV:         99 LV SV Index:   39 LVOT Area:     5.31 cm  LV Volumes (MOD) LV vol d, MOD A2C: 193.0 ml LV vol d, MOD A4C: 150.0 ml LV vol s, MOD A2C: 101.0 ml LV vol s, MOD A4C: 90.9 ml LV SV MOD A2C:     92.0 ml LV SV MOD A4C:     150.0 ml LV SV MOD BP:      75.2 ml RIGHT VENTRICLE RV Basal diam:  4.47 cm LEFT ATRIUM             Index        RIGHT ATRIUM           Index LA diam:        4.70 cm 1.83 cm/m   RA Area:  14.80 cm LA Vol (A2C):   54.5 ml 21.27 ml/m  RA Volume:   31.20 ml  12.18 ml/m LA Vol (A4C):   41.3 ml 16.12 ml/m LA Biplane Vol: 50.1 ml 19.56 ml/m  AORTIC VALVE                     PULMONIC VALVE AV Area (Vmax):    3.58 cm      PV Vmax:       1.13 m/s AV Area (Vmean):   3.48 cm      PV Vmean:      75.800 cm/s AV Area (VTI):     3.47 cm      PV VTI:        0.228 m AV Vmax:           160.00 cm/s   PV Peak grad:  5.1 mmHg AV Vmean:          105.000 cm/s  PV Mean grad:  3.0 mmHg AV VTI:            0.286 m AV Peak Grad:      10.2 mmHg AV Mean Grad:      5.0 mmHg LVOT Vmax:         108.00 cm/s LVOT Vmean:        68.800 cm/s LVOT VTI:          0.187 m LVOT/AV VTI ratio: 0.65  AORTA Ao Root diam: 3.80 cm MITRAL VALVE MV Area (PHT): 3.31 cm    SHUNTS MV Area VTI:   2.63 cm    Systemic VTI:  0.19 m MV Peak grad:  4.8 mmHg    Systemic Diam: 2.60 cm MV Mean grad:  2.0 mmHg MV Vmax:       1.09 m/s MV Vmean:      74.9 cm/s MV Decel Time: 229 msec MV E velocity: 84.00 cm/s MV A velocity: 94.70 cm/s MV E/A ratio:  0.89 Marthe Dant Electronically signed by Sena Slate Signature Date/Time: 01/31/2022/5:47:35 PM    Final     EKG: Sinus rhythm with PACs, right bundle branch block and left  anterior fascicular block.  LVH with repolarization abnormality.  ASSESSMENT AND PLAN:  Damieon Armendariz is a 76 year old male with a history of hypertension, HLD, obesity who presented to the emergency department with shortness of breath.  He was discovered to have newly decreased ejection fraction of 40 to 45% prompting cardiology consultation.  # New diagnosis HFrEF # Bifascicular Block (RBBB, LAFB) # Acute kidney injury Patient presents with 2 to 3 days of worsening shortness of breath, and subsequently discovered to have an EF of 40 to 45% with a mildly dilated left ventricle.  His dilation indicates this is likely a chronic condition, probably ongoing for some time.  He feels better after diuresis today, but his blood pressure is low following an episode of A-fib with RVR for which she received diltiazem. -Holding GDMT today -Goal to add beta-blocker, ACE inhibitor (on lisinopril at home) prior to discharge when BP improves -S/p Lasix 40 IV twice daily yesterday, and 1 dose today.  We will hold for the remainder of the day given his AKI.  He looks euvolemic on room air today -We will plan to start Lasix 40 mg p.o. tomorrow -Patient will require an ischemic evaluation.  We will determine if this needs to be completed inpatient or outpatient pending his clinical course.  # ? New onset AF Developed A-fib with RVR  overnight on 01/31/2022 for which diltiazem caused significant hypotension and worsening shortness of breath, and he was eventually converted to sinus rhythm with IV amiodarone. -CHA2DS2-VASc score =4 -Agree with heparin infusion for now with plans to transition to Eliquis at discharge -Continue IV amiodarone today, followed by transition to p.o. tomorrow for a total 10 g load.  Then 200 mg daily.   Signed: Armando ReichertYAN [name] Jocee Kissick MD 02/01/2022, 8:37 AM

## 2022-02-01 NOTE — Consult Note (Signed)
ANTICOAGULATION CONSULT NOTE  ? ?Pharmacy Consult for heparin infusion ?Indication: atrial fibrillation ? ?No Known Allergies ? ?Patient Measurements: ?Height: 6' (182.9 cm) ?Weight: 134.4 kg (296 lb 3.2 oz) ?IBW/kg (Calculated) : 77.6 ?Heparin Dosing Weight: 109.9 kg  ? ?Vital Signs: ?Temp: 98 ?F (36.7 ?C) (03/11 5366) ?Temp Source: Oral (03/11 4403) ?BP: 82/58 (03/11 4742) ?Pulse Rate: 69 (03/11 0738) ? ?Labs: ?Recent Labs  ?  01/30/22 ?1344 01/30/22 ?1654 01/30/22 ?1654 01/30/22 ?1844 01/31/22 ?5956 01/31/22 ?2157 01/31/22 ?2326 02/01/22 ?0726  ?HGB 12.8*  --   --   --  12.5*  --   --  13.7  ?HCT 37.7*  --   --   --  38.5*  --   --  41.0  ?PLT 285.0  --   --   --  284  --   --  282  ?LABPROT  --  14.1  --   --   --   --   --   --   ?INR  --  1.1  --   --   --   --   --   --   ?HEPARINUNFRC  --   --   --   --   --   --   --  0.10*  ?CREATININE 0.83  --   --   --  0.99  --   --  1.52*  ?TROPONINIHS  --  43*   < > 44*  --  60* 61*  --   ? < > = values in this interval not displayed.  ? ? ? ?Estimated Creatinine Clearance: 60.5 mL/min (A) (by C-G formula based on SCr of 1.52 mg/dL (H)). ? ? ?Medical History: ?Past Medical History:  ?Diagnosis Date  ? Dizziness   ? Elevated blood pressure   ? ? ?Medications:  ?Enoxaparin 70 mg SQ Q24H, last dose 3/9 2300 ? ?Assessment: ?76 year old male, presented to the ED 01/30/22 with 1 week history of progressive exertional dyspnea, orthopnea found to have new onset CHF (EF 40-45%). New onset Afib developed 01/31/22 evening. Pharmacy has been consulted for initiation and management of heparin infusion ? ?3/11 0726 HL 0.1  ? ?Goal of Therapy:  ?Heparin level 0.3-0.7 units/ml ?Monitor platelets by anticoagulation protocol: Yes ?  ?Plan:  ?Heparin level is subtherapeutic. Will give heparin bolus of 3300 units x 1 and increase heparin infusion to 1800 units/hr. Recheck heparin level in 8 hours. CBC daily while on heparin.  ? ?Ronnald Ramp, PharmD, BCPS ?Clinical Pharmacist    ?02/01/2022,8:19 AM ? ? ?

## 2022-02-01 NOTE — Plan of Care (Signed)

## 2022-02-01 NOTE — Progress Notes (Signed)
Provider B. Jon Billings notified that BP is 92/58, previous BP 89/54. Pt asymptomatic. Provider states to continue to monitor at this time blood pressure should slowly trend upward.  ? 02/01/22 0403  ?Vitals  ?Temp 98 ?F (36.7 ?C)  ?Temp Source Oral  ?BP (!) 92/58  ?BP Location Left Arm  ?BP Method Automatic  ?Patient Position (if appropriate) Lying  ?Pulse Rate Source Monitor  ?ECG Heart Rate (!) 117  ?Resp 19  ?Level of Consciousness  ?Level of Consciousness Alert  ?MEWS COLOR  ?MEWS Score Color Yellow  ?MEWS Score  ?MEWS Temp 0  ?MEWS Systolic 1  ?MEWS Pulse 2  ?MEWS RR 0  ?MEWS LOC 0  ?MEWS Score 3  ? ? ?

## 2022-02-01 NOTE — Progress Notes (Signed)
Pt heart rate sustained in the 140s per tele monitor, pt asymptomatic. Provider B. Randol Kern notified and came to bedside. See orders. EKG  completed . Cardizem drip started , will continue to monitor . Call bell within reach, pt has no concerns at this time. Charge RN Chadwicks aware.  ? 01/31/22 2214  ?Assess: MEWS Score  ?Temp 98.5 ?F (36.9 ?C)  ?BP 101/60  ?Pulse Rate (!) 112  ?Resp 18  ?Level of Consciousness Alert  ?SpO2 94 %  ?O2 Device Room Air  ?Assess: MEWS Score  ?MEWS Temp 0  ?MEWS Systolic 0  ?MEWS Pulse 2  ?MEWS RR 0  ?MEWS LOC 0  ?MEWS Score 2  ?MEWS Score Color Yellow  ?Assess: if the MEWS score is Yellow or Red  ?Were vital signs taken at a resting state? Yes  ?Focused Assessment Change from prior assessment (see assessment flowsheet)  ?Does the patient meet 2 or more of the SIRS criteria? No  ?MEWS guidelines implemented *See Row Information* Yes  ?Treat  ?MEWS Interventions Administered scheduled meds/treatments;Escalated (See documentation below)  ?Pain Scale 0-10  ?Pain Score 0  ?Take Vital Signs  ?Increase Vital Sign Frequency  Yellow: Q 2hr X 2 then Q 4hr X 2, if remains yellow, continue Q 4hrs  ?Escalate  ?MEWS: Escalate Yellow: discuss with charge nurse/RN and consider discussing with provider and RRT  ?Notify: Charge Nurse/RN  ?Name of Charge Nurse/RN Notified Durant RN  ?Date Charge Nurse/RN Notified 01/31/22  ?Time Charge Nurse/RN Notified 2230  ?Notify: Provider  ?Provider Name/Title B. Randol Kern NP  ?Date Provider Notified 01/31/22  ?Time Provider Notified 2229  ?Notification Type Page  ?Notification Reason Change in status ?(HR sustaining in the 140s)  ?Provider response At bedside;See new orders  ?Date of Provider Response 02/01/22  ?Time of Provider Response 2235  ?Document  ?Patient Outcome Stabilized after interventions  ?Progress note created (see row info) Yes  ?Assess: SIRS CRITERIA  ?SIRS Temperature  0  ?SIRS Pulse 1  ?SIRS Respirations  0  ?SIRS WBC 0  ?SIRS Score Sum  1  ? ? ?

## 2022-02-01 NOTE — Consult Note (Signed)
ANTICOAGULATION CONSULT NOTE  ? ?Pharmacy Consult for heparin infusion ?Indication: atrial fibrillation ? ?No Known Allergies ? ?Patient Measurements: ?Height: 6' (182.9 cm) ?Weight: 134.4 kg (296 lb 3.2 oz) ?IBW/kg (Calculated) : 77.6 ?Heparin Dosing Weight: 109.9 kg  ? ?Vital Signs: ?Temp: 97.7 ?F (36.5 ?C) (03/11 1627) ?Temp Source: Oral (03/11 1627) ?BP: 114/63 (03/11 1627) ?Pulse Rate: 66 (03/11 1627) ? ?Labs: ?Recent Labs  ?  01/30/22 ?1344 01/30/22 ?1654 01/30/22 ?1654 01/30/22 ?1844 01/31/22 ?[id number] 01/31/22 ?2157 01/31/22 ?2326 02/01/22 ?[id number] 02/01/22 ?1707  ?HGB 12.8*  --   --   --  12.5*  --   --  13.7  --   ?HCT 37.7*  --   --   --  38.5*  --   --  41.0  --   ?PLT 285.0  --   --   --  284  --   --  282  --   ?LABPROT  --  14.1  --   --   --   --   --   --   --   ?INR  --  1.1  --   --   --   --   --   --   --   ?HEPARINUNFRC  --   --   --   --   --   --   --  0.10* 0.36  ?CREATININE 0.83  --   --   --  0.99  --   --  1.52*  --   ?TROPONINIHS  --  43*   < > 44*  --  60* 61*  --   --   ? < > = values in this interval not displayed.  ? ? ? ?Estimated Creatinine Clearance: 60.5 mL/min (A) (by C-G formula based on SCr of 1.52 mg/dL (H)). ? ? ?Medical History: ?Past Medical History:  ?Diagnosis Date  ? Dizziness   ? Elevated blood pressure   ? ? ?Medications:  ?Enoxaparin 70 mg SQ Q24H, last dose 3/9 2300 ? ?Assessment: ?76 year old male, presented to the ED 01/30/22 with 1 week history of progressive exertional dyspnea, orthopnea found to have new onset CHF (EF 40-45%). New onset Afib developed 01/31/22 evening. Pharmacy has been consulted for initiation and management of heparin infusion ? ?3/11 0726 HL 0.10, subtherapeutic  ?3/11 1707 HL 0.36, therapeutic  ? ?Goal of Therapy:  ?Heparin level 0.3-0.7 units/ml ?Monitor platelets by anticoagulation protocol: Yes ?  ?Plan:  ?Heparin level is therapeutic. Will continue heparin infusion at 1800 units/hr. Recheck heparin level in 8 hours to confirm. CBC daily while on  heparin.  ? ?Sherilyn Banker, PharmD, BCPS ?Clinical Pharmacist   ?02/01/2022,5:44 PM ? ? ?

## 2022-02-01 NOTE — Progress Notes (Addendum)
Provider B. Jon Billings notified about red mews. Mews previously yellow for HR and BP. Red MEWS is for the same indication. Provider aware. See new orders ,will continue to monitor .  ?

## 2022-02-01 NOTE — Assessment & Plan Note (Addendum)
Overnight 3/10, developed A-fib with RVR. ?Briefly on IV Cardizem drip, caused hypotension and worsening dyspnea, appropriately discontinued in the context of low EF. ?Started on amiodarone drip and IV heparin.  Converted to sinus rhythm. ?Cardiology follow-up appreciated.  Amiodarone switched to p.o. 400 Mg twice daily x7 days then 200 Mg daily. ?Also continue IV heparin for now transitioned to Eliquis at discharge by cardiology.   ?CHA2DS2-VASc score: 4. ?Also initiated on Toprol-XL 12.5 Mg daily.  Cardiology has seen and cleared for discharge home. ?

## 2022-02-01 NOTE — Progress Notes (Signed)
PROGRESS NOTE   Adam HessRoger Leon Jennings  ZOX:[id number]    DOB: 06/10/1946    DOA: 01/30/2022  PCP: Doreene Nestlark, [name], NP   I have briefly reviewed patients [facility].  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  Seen by his PCP on day of admission, sent for outpatient blood work, given a prescription for Lasix which she had not started but presented to the ED after D-dimer and troponin came back elevated.  Admitted for suspected new onset acute systolic CHF.  Course complicated by new onset A-fib with RVR and AKI related to diuresis.  Cardiology consulted.   Assessment & Plan:  * Acute systolic CHF (congestive heart failure) (HCC) New onset 2D echo 3/10: LVEF 40-45% and LV global hypokinesis.  Grade 1 diastolic dysfunction. Treated with IV Lasix 40 mg twice daily 48 hours, developed AKI and IV Lasix discontinued. -1.94 L thus far and weight down by 12 pounds. Cardiology consultation appreciated.  Plan to start Lasix 40 mg p.o. tomorrow and initiate GDMT as BP and renal functions allow. For elevated D-dimer, underwent CTA chest which was negative for PE.  No clinical pneumonia or COPD exacerbation. Clinically improved and almost euvolemic.  Atrial fibrillation with RVR (HCC) Overnight 3/10, developed A-fib with RVR. Briefly on IV Cardizem drip, caused hypotension and worsening dyspnea, appropriately discontinued in the context of low EF. Started on amiodarone drip and IV heparin.  Converted to sinus rhythm. Cardiology consulted and input appreciated.  Recommend continuing IV amiodarone today followed by transition to oral amiodarone  tomorrow. Also continue IV heparin for now with plans to transition to Eliquis at discharge. CHA2DS2-VASc score: 4.  AKI (acute kidney injury) (HCC) Presented with creatinine of 0.83 Worsened to 1.52 on 3/11 in the context of IV Lasix, lisinopril, IV contrast on admission and overnight hypotension episodes. Discontinued IV Lasix and lisinopril. Follow BMP in AM.  Avoid nephrotoxic's.  Elevated troponin Suspecting demand ischemia related to acute CHF. Follow 2D echo. No anginal symptoms.  Essential hypertension Continue lisinopril.  Type II diabetes mellitus (HCC) A1c 7.8 Monitor CBGs and SSI for now.  May need to add maintenance insulins. Consider Farxiga  Hyperlipidemia Continue rosuvastatin.   Body mass index is 40.17 kg/m./Obesity Nutritional Status       Pressure Ulcer:    DVT prophylaxis:   Lovenox   Code Status: Full Code:  Family Communication: None at bedside Disposition:  Status is: Inpatient  Consultants:   Cardiology  Procedures:     Antimicrobials:      Subjective:  Overall much improved.  Dyspnea significantly better.  Unable to lay in bed due to bed being uncomfortable.  No chest pain.  No palpitations.  Did not sleep much last night.  Objective:   Vitals:   02/01/22 1145 02/01/22 1146 02/01/22 1230 02/01/22 1627  BP:  (!) 87/48 104/63 114/63  Pulse:  63  66  Resp: (!) 21 17 18 18   Temp:  97.7 F (36.5 C)  97.7 F (36.5 C)  TempSrc:  Oral  Oral  SpO2:  96%  97%  Weight:      Height:        General exam: Elderly  male, moderately built and obese sitting up comfortably in reclining chair without distress.  Looks much improved compared to yesterday. Respiratory system: Very occasional fine crackles in the left base but otherwise clear to auscultation.  No increased work of breathing. Cardiovascular system: S1 & S2 heard, RRR. No JVD, murmurs, rubs, gallops or clicks.  No pedal edema.  Telemetry personally reviewed: Had A-fib with RVR  from around 9 PM to 6:35 AM and then reverted to sinus rhythm.  BBB morphology. Gastrointestinal system: Abdomen is nondistended, soft and nontender. No organomegaly or masses felt. Normal bowel sounds heard. Central nervous system: Alert and oriented. No focal neurological deficits. Extremities: Symmetric 5 x 5 power. Skin: No rashes, lesions or ulcers Psychiatry: Judgement and insight appear normal. Mood & affect appropriate.     Data Reviewed:   I have personally reviewed following labs and imaging studies   CBC: Recent Labs  Lab 01/30/22 1344 01/31/22 0508 02/01/22 0726  WBC 9.4 9.7 11.1*  NEUTROABS 6.4  --   --   HGB 12.8* 12.5* 13.7  HCT 37.7* 38.5* 41.0  MCV 91.0 93.7 89.7  PLT 285.0 284 282    Basic Metabolic Panel: Recent Labs  Lab 01/30/22 1344 01/31/22 0508 02/01/22 0726  NA 136 138 135  K 4.6 4.4 4.2  CL 101 101 96*  CO2 GLUCOSE 166* 186* 192*  BUN 17 17 27*  CREATININE 0.83 0.99 1.52*  CALCIUM 9.5 8.8* 8.9  MG  --  2.0 2.0    Liver Function Tests: Recent Labs  Lab 01/30/22 1344  AST 21  ALT 21  ALKPHOS 47  BILITOT 0.7  PROT 7.5  ALBUMIN 4.1    CBG: Recent Labs  Lab 02/01/22 0817 02/01/22 1148 02/01/22 1626  GLUCAP 180* 189* 171*    Microbiology Studies:   Recent Results (from the past 240 hour(s))  Resp Panel by RT-PCR (Flu A&B, Covid) Nasopharyngeal Swab     Status: None   Collection Time: 01/30/22  7:12 PM   Specimen: Nasopharyngeal Swab; Nasopharyngeal(NP) swabs in vial transport medium  Result Value Ref Range Status   SARS Coronavirus 2 by RT PCR NEGATIVE NEGATIVE Final    Comment: (NOTE) SARS-CoV-2 target nucleic acids are NOT DETECTED.  The SARS-CoV-2 RNA is generally detectable in upper respiratory specimens during the acute phase of infection. The lowest concentration of SARS-CoV-2 viral copies this assay can detect is 138 copies/mL. A negative result does not preclude SARS-Cov-2 infection and should not be  used as the sole basis for treatment or other patient management decisions. A negative result may occur with  improper specimen collection/handling, submission of specimen other than nasopharyngeal swab, presence of viral mutation(s) within the areas targeted by this assay, and inadequate number of viral copies(<138 copies/mL). A negative result must be combined with clinical observations, patient history, and epidemiological information. The expected result is Negative.  Fact Sheet for Patients:  BloggerCourse.com  Fact Sheet for Healthcare Providers:  SeriousBroker.it  This test is no t yet approved or cleared by the [place] FDA and  has been authorized for detection and/or diagnosis of SARS-CoV-2 by FDA under an Emergency Use Authorization (EUA). This EUA will remain  in effect (meaning this test can be used) for the duration of the COVID-19 declaration under Section 564(b)(1) of the Act, 21 U.S.C.section 360bbb-3(b)(1), unless the authorization is terminated  or revoked sooner.       Influenza A by PCR NEGATIVE NEGATIVE Final   Influenza  B by PCR NEGATIVE NEGATIVE Final    Comment: (NOTE) The Xpert Xpress SARS-CoV-2/FLU/RSV plus assay is intended as an aid in the diagnosis of influenza from Nasopharyngeal swab specimens and should not be used as a sole basis for treatment. Nasal washings and aspirates are unacceptable for Xpert Xpress SARS-CoV-2/FLU/RSV testing.  Fact Sheet for Patients: BloggerCourse.com  Fact Sheet for Healthcare Providers: SeriousBroker.it  This test is not yet approved or cleared by the [place] FDA and has been authorized for detection and/or diagnosis of SARS-CoV-2 by FDA under an Emergency Use Authorization (EUA). This EUA will remain in effect (meaning this test can be used) for the duration of the COVID-19 declaration under Section  564(b)(1) of the Act, 21 U.S.C. section 360bbb-3(b)(1), unless the authorization is terminated or revoked.  Performed at [facility], [address] [place], [place] [address]     Radiology Studies:  CT Angio Chest PE W and/or Wo Contrast  Result Date: 01/30/2022 CLINICAL DATA:  Pulmonary embolism suspected, D-dimer positive. Chest pain which radiates. EXAM: CT ANGIOGRAPHY CHEST WITH CONTRAST TECHNIQUE: Multidetector CT imaging of the chest was performed using the standard protocol during bolus administration of intravenous contrast. Multiplanar CT image reconstructions and MIPs were obtained to evaluate the vascular anatomy. RADIATION DOSE REDUCTION: This exam was performed according to the departmental dose-optimization program which includes automated exposure control, adjustment of the mA and/or kV according to patient size and/or use of iterative reconstruction technique. CONTRAST:  OMNIPAQUE IOHEXOL 350 MG/ML SOLN COMPARISON:  None. FINDINGS: Cardiovascular: Satisfactory opacification of the pulmonary arteries to the segmental level. No evidence of pulmonary embolism. Aortic atherosclerosis without aneurysmal dilation. Mild left-sided cardiac enlargement. Small pericardial effusion. Mediastinum/Nodes: No supraclavicular adenopathy. No discrete thyroid nodule. No pathologically enlarged mediastinal, hilar or axillary lymph nodes. Lungs/Pleura: Diffuse bilateral bronchial wall thickening. Small right-sided pleural effusion with adjacent consolidation. Trace left pleural effusion with adjacent consolidation. Linear opacities in the right middle lobe and right lower lobe are favored to reflect atelectasis. Scattered right greater than left gas-filled cystic spaces may reflect pneumatoceles. Upper Abdomen: No acute abnormality. Musculoskeletal: Thoracic diffuse idiopathic skeletal hyperostosis. No acute osseous abnormality. Review of the MIP images confirms the above findings.  IMPRESSION: 1. No evidence of acute pulmonary embolism. 2. Small right and trace left pleural effusions with adjacent consolidations which may reflect atelectasis versus infiltrate. 3. Diffuse bilateral bronchial wall thickening with linear atelectasis in the right middle and right lower lobes. 4. Small pericardial effusion. 5.  Aortic Atherosclerosis (ICD10-I70.0). Electronically Signed   By: Maudry Mayhew M.D.   On: 01/30/2022 18:56   ECHOCARDIOGRAM COMPLETE  Result Date: 01/31/2022    ECHOCARDIOGRAM REPORT   Patient Name:   Adam Jennings Date of Exam: 01/31/2022 Medical Rec #:  [id number]        Height:       72.0 in Accession #:    [id number]       Weight:       308.6 lb Date of Birth:  [birth year]         BSA:          2.562 m Patient Age:    76 years         BP:           133/63 mmHg Patient Gender: M                HR:           68 bpm. Exam Location:  [facility] Procedure: 2D Echo, Color Doppler, Cardiac Doppler and Intracardiac            Opacification Agent Indications:     R06.00 Dyspnea ; Elevated troponin  History:         Patient has no prior history of Echocardiogram examinations.                  Signs/Symptoms:Dizziness/Lightheadedness and Shortness of                  Breath; Risk Factors:Hypertension and Diabetes.  Sonographer:     Humphrey Rolls Referring Phys:  [id number] Theadora Rama D Wanona Stare Diagnosing Phys: Sena Slate  Sonographer Comments: Suboptimal apical window and no subcostal window. Image acquisition challenging due to patient body habitus. IMPRESSIONS  1. Left ventricular ejection fraction, by estimation, is 40 to 45%. The left ventricle has mildly decreased function. The left ventricle demonstrates global hypokinesis. The left ventricular internal cavity size was mildly dilated. There is mild left ventricular hypertrophy. Left ventricular diastolic parameters are consistent with Grade I diastolic dysfunction (impaired relaxation).  2. Right ventricular systolic function was not well visualized. The  right ventricular size is not well visualized.  3. The mitral valve is normal in structure. Mild mitral valve regurgitation. No evidence of mitral stenosis.  4. The aortic valve is normal in structure. Aortic valve regurgitation is not visualized. No aortic stenosis is present.  5. Aortic dilatation noted. There is mild dilatation of the aortic root, measuring 38 mm. Conclusion(s)/Recommendation(s): Technically difficult study. Likely mild LV dysfunction. Unable to assess RV. FINDINGS  Left Ventricle: Left ventricular ejection fraction, by estimation, is 40 to 45%. The left ventricle has mildly decreased function. The left ventricle demonstrates global hypokinesis. Definity contrast agent was given IV to delineate the left ventricular  endocardial borders. The left ventricular internal cavity size was mildly dilated. There is mild left ventricular hypertrophy. Left ventricular diastolic parameters are consistent with Grade I diastolic dysfunction (impaired relaxation). Right Ventricle: The right ventricular size is not well visualized. Right ventricular systolic function was not well visualized. Left Atrium: Left atrial size was normal in size. Right Atrium: Right atrial size was not well visualized. Pericardium: There is no evidence of pericardial effusion. Mitral Valve: The mitral valve is normal in structure. Mild mitral valve regurgitation. No evidence of mitral valve stenosis. MV peak gradient, 4.8 mmHg. The mean mitral valve gradient is 2.0 mmHg. Tricuspid Valve: The tricuspid valve is not well visualized. Aortic Valve: The aortic valve is normal in structure. Aortic valve regurgitation is not visualized. No aortic stenosis is present. Aortic valve mean gradient measures 5.0 mmHg. Aortic valve peak gradient measures 10.2 mmHg. Aortic valve area, by VTI measures 3.47 cm. Pulmonic Valve: The pulmonic valve was not well visualized. Pulmonic valve regurgitation is not visualized. No evidence of pulmonic stenosis.  Aorta: Aortic dilatation noted. There is mild dilatation of the aortic root, measuring 38 mm. IAS/Shunts: The interatrial septum was not well visualized.  LEFT VENTRICLE PLAX 2D LVIDd:         5.96 cm      Diastology LVIDs:         5.06 cm      LV e' medial:    5.98 cm/s LV PW:         1.24 cm      LV E/e' medial:  14.0 LV IVS:        1.06 cm      LV e' lateral:   8.59  cm/s LVOT diam:     2.60 cm      LV E/e' lateral: 9.8 LV SV:         99 LV SV Index:   39 LVOT Area:     5.31 cm  LV Volumes (MOD) LV vol d, MOD A2C: 193.0 ml LV vol d, MOD A4C: 150.0 ml LV vol s, MOD A2C: 101.0 ml LV vol s, MOD A4C: 90.9 ml LV SV MOD A2C:     92.0 ml LV SV MOD A4C:     150.0 ml LV SV MOD BP:      75.2 ml RIGHT VENTRICLE RV Basal diam:  4.47 cm LEFT ATRIUM             Index        RIGHT ATRIUM           Index LA diam:        4.70 cm 1.83 cm/m   RA Area:     14.80 cm LA Vol (A2C):   54.5 ml 21.27 ml/m  RA Volume:   31.20 ml  12.18 ml/m LA Vol (A4C):   41.3 ml 16.12 ml/m LA Biplane Vol: 50.1 ml 19.56 ml/m  AORTIC VALVE                     PULMONIC VALVE AV Area (Vmax):    3.58 cm      PV Vmax:       1.13 m/s AV Area (Vmean):   3.48 cm      PV Vmean:      75.800 cm/s AV Area (VTI):     3.47 cm      PV VTI:        0.228 m AV Vmax:           160.00 cm/s   PV Peak grad:  5.1 mmHg AV Vmean:          105.000 cm/s  PV Mean grad:  3.0 mmHg AV VTI:            0.286 m AV Peak Grad:      10.2 mmHg AV Mean Grad:      5.0 mmHg LVOT Vmax:         108.00 cm/s LVOT Vmean:        68.800 cm/s LVOT VTI:          0.187 m LVOT/AV VTI ratio: 0.65  AORTA Ao Root diam: 3.80 cm MITRAL VALVE MV Area (PHT): 3.31 cm    SHUNTS MV Area VTI:   2.63 cm    Systemic VTI:  0.19 m MV Peak grad:  4.8 mmHg    Systemic Diam: 2.60 cm MV Mean grad:  2.0 mmHg MV Vmax:       1.09 m/s MV Vmean:      74.9 cm/s MV Decel Time: 229 msec MV E velocity: 84.00 cm/s MV A velocity: 94.70 cm/s MV E/A ratio:  0.89 Sena Slate Electronically signed by Sena Slate Signature  Date/Time: 01/31/2022/5:47:35 PM    Final     Scheduled Meds:    [START ON 02/02/2022] aspirin  81 mg Oral Daily   finasteride  5 mg Oral Daily   insulin aspart  0-6 Units Subcutaneous TID WC   rosuvastatin  20 mg Oral Daily   sodium chloride flush  3 mL Intravenous Q12H    Continuous Infusions:    amiodarone 30 mg/hr (02/01/22 1506)   heparin 1,800 Units/hr (02/01/22 1503)  LOS: 1 day     Marcellus Scott, MD,  FACP, [facility], [facility], [facility] (Care Management Physician Certified) [facility] [place]  To contact the attending provider between 7A-7P or the covering provider during after hours 7P-7A, please log into the web site [email or web address] and access using universal [place] password for that web site. If you do not have the password, please call the hospital operator.  02/01/2022, 4:53 PM

## 2022-02-02 LAB — GLUCOSE, CAPILLARY
Glucose-Capillary: 173 mg/dL — ABNORMAL HIGH (ref 70–99)
Glucose-Capillary: 187 mg/dL — ABNORMAL HIGH (ref 70–99)
Glucose-Capillary: 130 mg/dL — ABNORMAL HIGH (ref 70–99)
Glucose-Capillary: 259 mg/dL — ABNORMAL HIGH (ref 70–99)

## 2022-02-02 LAB — BASIC METABOLIC PANEL
Anion gap: 6 (ref 5–15)
BUN: 36 mg/dL — ABNORMAL HIGH (ref 8–23)
CO2: 31 mmol/L (ref 22–32)
Calcium: 8.6 mg/dL — ABNORMAL LOW (ref 8.9–10.3)
Chloride: 98 mmol/L (ref 98–111)
Creatinine, Ser: 1.46 mg/dL — ABNORMAL HIGH (ref 0.61–1.24)
GFR, Estimated: 50 mL/min — ABNORMAL LOW (ref >60)
Glucose, Bld: 185 mg/dL — ABNORMAL HIGH (ref 70–99)
Potassium: 4 mmol/L (ref 3.5–5.1)
Sodium: 135 mmol/L (ref 135–145)

## 2022-02-02 LAB — HEPARIN LEVEL (UNFRACTIONATED)
Heparin Unfractionated: 0.39 IU/mL (ref 0.30–0.70)
Heparin Unfractionated: 0.37 IU/mL (ref 0.30–0.70)

## 2022-02-02 LAB — CBC
HCT: 38 % — ABNORMAL LOW (ref 39.0–52.0)
Hemoglobin: 12.7 g/dL — ABNORMAL LOW (ref 13.0–17.0)
MCH: 30 pg (ref 26.0–34.0)
MCHC: 33.4 g/dL (ref 30.0–36.0)
MCV: 89.8 fL (ref 80.0–100.0)
Platelets: 320 10*3/uL (ref 150–400)
RBC: 4.23 MIL/uL (ref 4.22–5.81)
RDW: 13.2 % (ref 11.5–15.5)
WBC: 10 10*3/uL (ref 4.0–10.5)
nRBC: 0 % (ref 0.0–0.2)

## 2022-02-02 MED ORDER — AMIODARONE HCL 200 MG PO TABS: 200.0000 mg | ORAL_TABLET | Freq: Every day | ORAL | Status: DC

## 2022-02-02 MED ORDER — METOPROLOL SUCCINATE ER 25 MG PO TB24
12.5000 mg | ORAL_TABLET | Freq: Every day | ORAL | Status: DC
  Administered 2022-02-02 – 2022-02-03 (×2): 12.5 mg via ORAL
  Filled 2022-02-02 (×2): qty 1

## 2022-02-02 MED ORDER — AMIODARONE HCL 200 MG PO TABS
400.0000 mg | ORAL_TABLET | Freq: Two times a day (BID) | ORAL | Status: DC
  Administered 2022-02-02 – 2022-02-03 (×3): 400 mg via ORAL
  Filled 2022-02-02 (×3): qty 2

## 2022-02-02 NOTE — Consult Note (Signed)
ANTICOAGULATION CONSULT NOTE  ? ?Pharmacy Consult for heparin infusion ?Indication: atrial fibrillation ? ?No Known Allergies ? ?Patient Measurements: ?Height: 6' (182.9 cm) ?Weight: 134.4 kg (296 lb 3.2 oz) ?IBW/kg (Calculated) : 77.6 ?Heparin Dosing Weight: 109.9 kg  ? ?Vital Signs: ?Temp: 97.8 ?F (36.6 ?C) (03/12 0000) ?Temp Source: Oral (03/12 0000) ?BP: 137/69 (03/12 0000) ?Pulse Rate: 64 (03/12 0000) ? ?Labs: ?Recent Labs  ?  01/30/22 ?1344 01/30/22 ?1654 01/30/22 ?1654 01/30/22 ?1844 01/31/22 ?5053 01/31/22 ?2157 01/31/22 ?2326 02/01/22 ?0726 02/01/22 ?1707 02/02/22 ?0123  ?HGB 12.8*  --   --   --  12.5*  --   --  13.7  --   --   ?HCT 37.7*  --   --   --  38.5*  --   --  41.0  --   --   ?PLT 285.0  --   --   --  284  --   --  282  --   --   ?LABPROT  --  14.1  --   --   --   --   --   --   --   --   ?INR  --  1.1  --   --   --   --   --   --   --   --   ?HEPARINUNFRC  --   --   --   --   --   --   --  0.10* 0.36 0.39  ?CREATININE 0.83  --   --   --  0.99  --   --  1.52*  --   --   ?TROPONINIHS  --  43*   < > 44*  --  60* 61*  --   --   --   ? < > = values in this interval not displayed.  ? ? ? ?Estimated Creatinine Clearance: 60.5 mL/min (A) (by C-G formula based on SCr of 1.52 mg/dL (H)). ? ? ?Medical History: ?Past Medical History:  ?Diagnosis Date  ? Dizziness   ? Elevated blood pressure   ? ? ?Medications:  ?Enoxaparin 70 mg SQ Q24H, last dose 3/9 2300 ? ?Assessment: ?75 year old male, presented to the ED 01/30/22 with 1 week history of progressive exertional dyspnea, orthopnea found to have new onset CHF (EF 40-45%). New onset Afib developed 01/31/22 evening. Pharmacy has been consulted for initiation and management of heparin infusion ? ?3/11 0726 HL 0.10, subtherapeutic  ?3/11 1707 HL 0.36, therapeutic  ?3/12 0123 HL 0.39, therapeutic x 2 ? ?Goal of Therapy:  ?Heparin level 0.3-0.7 units/ml ?Monitor platelets by anticoagulation protocol: Yes ?  ?Plan:  ?Heparin level is therapeutic. Will continue heparin  infusion at 1800 units/hr. Recheck heparin level in 8 hours. CBC daily while on heparin.  ? ?Clovia Cuff, PharmD, BCPS ?02/02/2022 ?3:09 AM ? ? ? ?

## 2022-02-02 NOTE — Consult Note (Signed)
ANTICOAGULATION CONSULT NOTE  ? ?Pharmacy Consult for heparin infusion ?Indication: atrial fibrillation ? ?No Known Allergies ? ?Patient Measurements: ?Height: 6' (182.9 cm) ?Weight: 134.4 kg (296 lb 3.2 oz) ?IBW/kg (Calculated) : 77.6 ?Heparin Dosing Weight: 109.9 kg  ? ?Vital Signs: ?Temp: 97.9 ?F (36.6 ?C) (03/12 0754) ?Temp Source: Oral (03/12 0754) ?BP: 130/76 (03/12 0754) ?Pulse Rate: 65 (03/12 0754) ? ?Labs: ?Recent Labs  ?  01/30/22 ?1654 01/30/22 ?1844 01/31/22 ?8657 01/31/22 ?8469 01/31/22 ?2157 01/31/22 ?2326 02/01/22 ?0726 02/01/22 ?1707 02/02/22 ?0123 02/02/22 ?6295 02/02/22 ?0950  ?HGB  --   --  12.5*  --   --   --  13.7  --   --  12.7*  --   ?HCT  --   --  38.5*  --   --   --  41.0  --   --  38.0*  --   ?PLT  --   --  284  --   --   --  282  --   --  320  --   ?LABPROT 14.1  --   --   --   --   --   --   --   --   --   --   ?INR 1.1  --   --   --   --   --   --   --   --   --   --   ?HEPARINUNFRC  --   --   --    < >  --   --  0.10* 0.36 0.39  --  0.37  ?CREATININE  --   --  0.99  --   --   --  1.52*  --   --  1.46*  --   ?TROPONINIHS 43* 44*  --   --  60* 61*  --   --   --   --   --   ? < > = values in this interval not displayed.  ? ? ? ?Estimated Creatinine Clearance: 63 mL/min (A) (by C-G formula based on SCr of 1.46 mg/dL (H)). ? ? ?Medical History: ?Past Medical History:  ?Diagnosis Date  ? Dizziness   ? Elevated blood pressure   ? ? ?Medications:  ?Enoxaparin 70 mg SQ Q24H, last dose 3/9 2300 ? ?Assessment: ?76 year old male, presented to the ED 01/30/22 with 1 week history of progressive exertional dyspnea, orthopnea found to have new onset CHF (EF 40-45%). New onset Afib developed 01/31/22 evening. Pharmacy has been consulted for initiation and management of heparin infusion. CHADSVASc 4.  ? ?3/11 0726 HL 0.10, subtherapeutic  ?3/11 1707 HL 0.36, therapeutic  ?3/12 0123 HL 0.39, therapeutic x 2 ?3/12 0950 HL 0.37  ? ?Goal of Therapy:  ?Heparin level 0.3-0.7 units/ml ?Monitor platelets by  anticoagulation protocol: Yes ?  ?Plan:  ?Heparin level is therapeutic. Will continue heparin infusion at 1800 units/hr. Recheck heparin level and CBC with AM labs.   ? ?Paschal Dopp, PharmD, BCPS ?02/02/2022 ?10:30 AM ? ? ? ?

## 2022-02-02 NOTE — Progress Notes (Signed)
[facility] Cardiology  CARDIOLOGY CONSULT NOTE  Patient ID: Caprice Wasko MRN: [id number] DOB/AGE: 1946-08-06 76 y.o.  Admit date: 01/30/2022 Referring Physician Marcellus Scott Primary Physician Doreene Nest, NP Primary Cardiologist NA Reason for Consultation New diagnosis HFrEF  HPI:  Jerardo Costabile is a 76 year old male with a history of hypertension, HLD, obesity who presented to the emergency department with shortness of breath.  He was discovered to have newly decreased ejection fraction of 40 to 45% and new onset AF prompting cardiology consultation.  Interval history: - Remained in NSR for > 24 hrs since amiodarone administration.  - Feels "great" this morning. Denies shortness of breath. [place] Spaniel he was been walking with no issues.   Social - Prior cigar smoker, quit 40 years ago  Review of systems complete and found to be negative unless listed above     Past Medical History:  Diagnosis Date   Dizziness    Elevated blood pressure     Past Surgical History:  Procedure Laterality Date   CATARACT EXTRACTION W/ INTRAOCULAR LENS IMPLANT Right 11/05/2017   CATARACT EXTRACTION W/ INTRAOCULAR LENS IMPLANT Left 12/08/2017    Medications Prior to Admission  Medication Sig Dispense Refill Last Dose   finasteride (PROSCAR) 5 MG tablet TAKE 1 TABLET (5 MG TOTAL) BY MOUTH DAILY. 90 tablet 3 01/30/2022   furosemide (LASIX) 20 MG tablet Take 1 tablet (20 mg total) by mouth daily. 20 tablet 0 01/30/2022   lisinopril (ZESTRIL) 20 MG tablet Take 1 tablet (20 mg total) by mouth daily. For blood pressure. 90 tablet 3 01/30/2022   meloxicam (MOBIC) 15 MG tablet Take 15 mg by mouth daily.   01/30/2022   Omega-3 Fatty Acids (FISH OIL) 1000 MG CAPS Take 1,000 mg by mouth daily.   01/30/2022   rosuvastatin (CRESTOR) 20 MG tablet Take 1 tablet (20 mg total) by mouth daily. For cholesterol. Have patient call the office! 90 tablet 0 01/30/2022   Social History   Socioeconomic History   Marital status: Married     Spouse name: Not on file   Number of children: Not on file   Years of education: Not on file   Highest education level: Not on file  Occupational History   Not on file  Tobacco Use   Smoking status: Never    Passive exposure: Never   Smokeless tobacco: Never  Vaping Use   Vaping Use: Never used  Substance and Sexual Activity   Alcohol use: No    Alcohol/week: 0.0 standard drinks   Drug use: No   Sexual activity: Not Currently  Other Topics Concern   Not on file  Social History Narrative   Married.   1 daughter, 4 grandchildren.   Works in [employer] in [place].   Enjoys working on tractors.    Social Determinants of Health   Financial Resource Strain: Not on file  Food Insecurity: Not on file  Transportation Needs: Not on file  Physical Activity: Not on file  Stress: Not on file  Social Connections: Not on file  Intimate Partner Violence: Not on file    Family History  Problem Relation Age of Onset   Hypertension Father       Review of systems complete and found to be negative unless listed above      PHYSICAL EXAM  Vitals:   02/02/22 0513 02/02/22 0754  BP: 118/71 130/76  Pulse: 65 65  Resp: 16 16  Temp: (!) 97.5 F (36.4 C) 97.9 F (36.6 C)  SpO2: 94% 95%     General: Well developed, well nourished, in no acute distress HEENT:  Normocephalic and atramatic Neck:  No JVD.  Lungs: Clear bilaterally to auscultation and percussion. Heart: HRRR . Normal S1 and S2 without gallops or murmurs.  Abdomen: Bowel sounds are positive, abdomen soft and non-tender  Msk:  Back normal, normal gait. Normal strength and tone for age. Extremities: No clubbing, cyanosis or edema.   Neuro: Alert and oriented X 3. Psych:  Good affect, responds appropriately  Labs:   Lab Results  Component Value Date   WBC 10.0 02/02/2022   HGB 12.7 (L) 02/02/2022   HCT 38.0 (L) 02/02/2022   MCV 89.8 02/02/2022   PLT 320 02/02/2022    Recent Labs  Lab 01/30/22 1344  01/31/22 0508 02/02/22 0538  NA 136   < > 135  K 4.6   < > 4.0  CL 101   < > 98  CO2 30   < > 31  BUN 17   < > 36*  CREATININE 0.83   < > 1.46*  CALCIUM 9.5   < > 8.6*  PROT 7.5  --   --   BILITOT 0.7  --   --   ALKPHOS 47  --   --   ALT 21  --   --   AST 21  --   --   GLUCOSE 166*   < > 185*   < > = values in this interval not displayed.    Lab Results  Component Value Date   TROPONINI <0.03 02/12/2016    Lab Results  Component Value Date   CHOL 177 11/26/2021   CHOL 172 10/23/2020   CHOL 149 08/14/2020   Lab Results  Component Value Date   HDL 32.50 (L) 11/26/2021   HDL 39.80 10/23/2020   HDL 30.40 (L) 08/14/2020   Lab Results  Component Value Date   LDLCALC 123 (H) 11/26/2021   LDLCALC 115 (H) 10/23/2020   LDLCALC 102 (H) 08/14/2020   Lab Results  Component Value Date   TRIG 105.0 11/26/2021   TRIG 86.0 10/23/2020   TRIG 84.0 08/14/2020   Lab Results  Component Value Date   CHOLHDL 5 11/26/2021   CHOLHDL 4 10/23/2020   CHOLHDL 5 08/14/2020   No results found for: LDLDIRECT    Radiology: DG Chest 2 View  Result Date: 01/30/2022 CLINICAL DATA:  Dyspnea on exertion. EXAM: CHEST - 2 VIEW COMPARISON:  None. FINDINGS: Mild cardiomegaly is noted. Left lung is clear. Elevated right hemidiaphragm is noted. Mild right basilar atelectasis is noted with small pleural effusion. Bony thorax is unremarkable. IMPRESSION: Elevated right hemidiaphragm. Mild right basilar atelectasis is noted with small right pleural effusion. Electronically Signed   By: Lupita Raider M.D.   On: 01/30/2022 21:22   CT Angio Chest PE W and/or Wo Contrast  Result Date: 01/30/2022 CLINICAL DATA:  Pulmonary embolism suspected, D-dimer positive. Chest pain which radiates. EXAM: CT ANGIOGRAPHY CHEST WITH CONTRAST TECHNIQUE: Multidetector CT imaging of the chest was performed using the standard protocol during bolus administration of intravenous contrast. Multiplanar CT image reconstructions and  MIPs were obtained to evaluate the vascular anatomy. RADIATION DOSE REDUCTION: This exam was performed according to the departmental dose-optimization program which includes automated exposure control, adjustment of the mA and/or kV according to patient size and/or use of iterative reconstruction technique. CONTRAST:  OMNIPAQUE IOHEXOL 350 MG/ML SOLN COMPARISON:  None. FINDINGS: Cardiovascular: Satisfactory opacification of the  pulmonary arteries to the segmental level. No evidence of pulmonary embolism. Aortic atherosclerosis without aneurysmal dilation. Mild left-sided cardiac enlargement. Small pericardial effusion. Mediastinum/Nodes: No supraclavicular adenopathy. No discrete thyroid nodule. No pathologically enlarged mediastinal, hilar or axillary lymph nodes. Lungs/Pleura: Diffuse bilateral bronchial wall thickening. Small right-sided pleural effusion with adjacent consolidation. Trace left pleural effusion with adjacent consolidation. Linear opacities in the right middle lobe and right lower lobe are favored to reflect atelectasis. Scattered right greater than left gas-filled cystic spaces may reflect pneumatoceles. Upper Abdomen: No acute abnormality. Musculoskeletal: Thoracic diffuse idiopathic skeletal hyperostosis. No acute osseous abnormality. Review of the MIP images confirms the above findings. IMPRESSION: 1. No evidence of acute pulmonary embolism. 2. Small right and trace left pleural effusions with adjacent consolidations which may reflect atelectasis versus infiltrate. 3. Diffuse bilateral bronchial wall thickening with linear atelectasis in the right middle and right lower lobes. 4. Small pericardial effusion. 5.  Aortic Atherosclerosis (ICD10-I70.0). Electronically Signed   By: Maudry Mayhew M.D.   On: 01/30/2022 18:56   ECHOCARDIOGRAM COMPLETE  Result Date: 01/31/2022    ECHOCARDIOGRAM REPORT   Patient Name:   VISHAL SANDLIN Date of Exam: 01/31/2022 Medical Rec #:  [id number]         Height:       72.0 in Accession #:    [id number]       Weight:       308.6 lb Date of Birth:  05-Oct-1946         BSA:          2.562 m Patient Age:    76 years         BP:           133/63 mmHg Patient Gender: M                HR:           68 bpm. Exam Location:  [facility] Procedure: 2D Echo, Color Doppler, Cardiac Doppler and Intracardiac            Opacification Agent Indications:     R06.00 Dyspnea ; Elevated troponin  History:         Patient has no prior history of Echocardiogram examinations.                  Signs/Symptoms:Dizziness/Lightheadedness and Shortness of                  Breath; Risk Factors:Hypertension and Diabetes.  Sonographer:     Humphrey Rolls Referring Phys:  [id number] Theadora Rama D HONGALGI Diagnosing Phys: Sena Slate  Sonographer Comments: Suboptimal apical window and no subcostal window. Image acquisition challenging due to patient body habitus. IMPRESSIONS  1. Left ventricular ejection fraction, by estimation, is 40 to 45%. The left ventricle has mildly decreased function. The left ventricle demonstrates global hypokinesis. The left ventricular internal cavity size was mildly dilated. There is mild left ventricular hypertrophy. Left ventricular diastolic parameters are consistent with Grade I diastolic dysfunction (impaired relaxation).  2. Right ventricular systolic function was not well visualized. The right ventricular size is not well visualized.  3. The mitral valve is normal in structure. Mild mitral valve regurgitation. No evidence of mitral stenosis.  4. The aortic valve is normal in structure. Aortic valve regurgitation is not visualized. No aortic stenosis is present.  5. Aortic dilatation noted. There is mild dilatation of the aortic root, measuring 38 mm. Conclusion(s)/Recommendation(s): Technically difficult study. Likely mild LV dysfunction. Unable to assess  RV. FINDINGS  Left Ventricle: Left ventricular ejection fraction, by estimation, is 40 to 45%. The left ventricle has mildly decreased  function. The left ventricle demonstrates global hypokinesis. Definity contrast agent was given IV to delineate the left ventricular  endocardial borders. The left ventricular internal cavity size was mildly dilated. There is mild left ventricular hypertrophy. Left ventricular diastolic parameters are consistent with Grade I diastolic dysfunction (impaired relaxation). Right Ventricle: The right ventricular size is not well visualized. Right ventricular systolic function was not well visualized. Left Atrium: Left atrial size was normal in size. Right Atrium: Right atrial size was not well visualized. Pericardium: There is no evidence of pericardial effusion. Mitral Valve: The mitral valve is normal in structure. Mild mitral valve regurgitation. No evidence of mitral valve stenosis. MV peak gradient, 4.8 mmHg. The mean mitral valve gradient is 2.0 mmHg. Tricuspid Valve: The tricuspid valve is not well visualized. Aortic Valve: The aortic valve is normal in structure. Aortic valve regurgitation is not visualized. No aortic stenosis is present. Aortic valve mean gradient measures 5.0 mmHg. Aortic valve peak gradient measures 10.2 mmHg. Aortic valve area, by VTI measures 3.47 cm. Pulmonic Valve: The pulmonic valve was not well visualized. Pulmonic valve regurgitation is not visualized. No evidence of pulmonic stenosis. Aorta: Aortic dilatation noted. There is mild dilatation of the aortic root, measuring 38 mm. IAS/Shunts: The interatrial septum was not well visualized.  LEFT VENTRICLE PLAX 2D LVIDd:         5.96 cm      Diastology LVIDs:         5.06 cm      LV e' medial:    5.98 cm/s LV PW:         1.24 cm      LV E/e' medial:  14.0 LV IVS:        1.06 cm      LV e' lateral:   8.59 cm/s LVOT diam:     2.60 cm      LV E/e' lateral: 9.8 LV SV:         99 LV SV Index:   39 LVOT Area:     5.31 cm  LV Volumes (MOD) LV vol d, MOD A2C: 193.0 ml LV vol d, MOD A4C: 150.0 ml LV vol s, MOD A2C: 101.0 ml LV vol s, MOD A4C: 90.9  ml LV SV MOD A2C:     92.0 ml LV SV MOD A4C:     150.0 ml LV SV MOD BP:      75.2 ml RIGHT VENTRICLE RV Basal diam:  4.47 cm LEFT ATRIUM             Index        RIGHT ATRIUM           Index LA diam:        4.70 cm 1.83 cm/m   RA Area:     14.80 cm LA Vol (A2C):   54.5 ml 21.27 ml/m  RA Volume:   31.20 ml  12.18 ml/m LA Vol (A4C):   41.3 ml 16.12 ml/m LA Biplane Vol: 50.1 ml 19.56 ml/m  AORTIC VALVE                     PULMONIC VALVE AV Area (Vmax):    3.58 cm      PV Vmax:       1.13 m/s AV Area (Vmean):   3.48 cm      PV  Vmean:      75.800 cm/s AV Area (VTI):     3.47 cm      PV VTI:        0.228 m AV Vmax:           160.00 cm/s   PV Peak grad:  5.1 mmHg AV Vmean:          105.000 cm/s  PV Mean grad:  3.0 mmHg AV VTI:            0.286 m AV Peak Grad:      10.2 mmHg AV Mean Grad:      5.0 mmHg LVOT Vmax:         108.00 cm/s LVOT Vmean:        68.800 cm/s LVOT VTI:          0.187 m LVOT/AV VTI ratio: 0.65  AORTA Ao Root diam: 3.80 cm MITRAL VALVE MV Area (PHT): 3.31 cm    SHUNTS MV Area VTI:   2.63 cm    Systemic VTI:  0.19 m MV Peak grad:  4.8 mmHg    Systemic Diam: 2.60 cm MV Mean grad:  2.0 mmHg MV Vmax:       1.09 m/s MV Vmean:      74.9 cm/s MV Decel Time: 229 msec MV E velocity: 84.00 cm/s MV A velocity: 94.70 cm/s MV E/A ratio:  0.89 Sena Slateyan Lashaunda Schild Electronically signed by Sena Slateyan Deshun Sedivy Signature Date/Time: 01/31/2022/5:47:35 PM    Final     EKG: Sinus rhythm with PACs, right bundle branch block and left anterior fascicular block.  LVH with repolarization abnormality.  ASSESSMENT AND PLAN:  Rollene RotundaRoger Ricketson is a 76 year old male with a history of hypertension, HLD, obesity who presented to the emergency department with shortness of breath.  He was discovered to have newly decreased ejection fraction of 40 to 45% prompting cardiology consultation.  # New diagnosis HFrEF # Bifascicular Block (RBBB, LAFB) # Acute kidney injury Patient presents with 2 to 3 days of worsening shortness of breath, and  subsequently discovered to have an EF of 40 to 45% with a mildly dilated left ventricle.  His dilation indicates this is likely a chronic condition, probably ongoing for some time.  He feels better after diuresis. Titrating GDMT.  - start metoprolol XL 12.5 mg daily -Will add losartan tomorrow if renal function continues to improve -S/p Lasix 40 IV twice daily 3/10, and 1 dose 3/11 --> AKI. He is on RA and feels improved -Hold lasix today and start PO lasix 40 mg tomorrow  -Patient will require an ischemic evaluation.  This can be completed as an outpatient.   # ? New onset AF Developed A-fib with RVR overnight on 01/31/2022 for which diltiazem caused significant hypotension and worsening shortness of breath, and he was eventually converted to sinus rhythm with IV amiodarone. -CHA2DS2-VASc score =4 -Agree with heparin infusion for now with plans to transition to Eliquis at discharge -Transition from IV amiodarone to PO amiodarone 400 mg BID today for 7 days, then 200 mg daily.   Signed: Armando ReichertYAN [name] Breelle Hollywood MD 02/02/2022, 8:51 AM

## 2022-02-02 NOTE — Progress Notes (Signed)
PROGRESS NOTE   Adam Jennings  BCW:[id number]    DOB: 01-07-1946    DOA: 01/30/2022  PCP: Doreene Nest, NP   I have briefly reviewed patients previous medical [facility] in [facility].  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  Seen by his PCP on day of admission, sent for outpatient blood work, given a prescription for Lasix which she had not started but presented to the ED after D-dimer and troponin came back elevated.  Admitted for suspected new onset acute systolic CHF.  Course complicated by new onset A-fib with RVR and AKI related to diuresis.  Cardiology consulted.  Continues to gradually improve.  Hopeful DC home in the next 1 to 2 days.   Assessment & Plan:  * Acute systolic CHF (congestive heart failure) (HCC) New onset 2D echo 3/10: LVEF 40-45% and LV global hypokinesis.  Grade 1 diastolic dysfunction. Treated with IV Lasix 40 mg twice daily 48 hours, developed AKI and IV Lasix discontinued. -1.84 L thus far and weight down by 12 pounds. For elevated D-dimer, underwent CTA chest which was negative for PE.  No clinical pneumonia or COPD exacerbation. Clinically improved and euvolemic. Cardiology follow-up appreciated.  Toprol-XL 12.5 Mg daily initiated.  Plans to start Lasix 40 mg daily and losartan tomorrow pending improvement in renal insufficiency  Atrial fibrillation with RVR (HCC) Overnight 3/10, developed A-fib with RVR. Briefly on IV Cardizem drip, caused hypotension and worsening dyspnea, appropriately discontinued in the context of low EF. Started on amiodarone drip and IV heparin.  Converted to sinus rhythm. Cardiology follow-up  appreciated.  Amiodarone switched to p.o. 400 Mg twice daily x7 days then 200 Mg daily. Also continue IV heparin for now with plans to transition to Eliquis at discharge. CHA2DS2-VASc score: 4.  AKI (acute kidney injury) (HCC) Presented with creatinine of 0.83 Worsened to 1.52 on 3/11 in the context of IV Lasix, lisinopril, IV contrast on admission and hypotension. Discontinued IV Lasix and lisinopril. Improving.  Creatinine down to 1.46.  Elevated troponin Suspecting demand ischemia related to acute CHF. Follow 2D echo. No anginal symptoms.  Essential hypertension Lisinopril held due to AKI. Toprol-XL initiated today and possibly starting losartan in AM.  Type II diabetes mellitus (HCC) A1c 7.8 Monitor CBGs and SSI for now.  May need to add maintenance insulins. Consider Farxiga Reasonable inpatient control.  Hyperlipidemia Continue rosuvastatin.   Body mass index is 40.17 kg/m./Obesity Nutritional Status       Pressure Ulcer:    DVT prophylaxis:   Lovenox   Code Status: Full Code:  Family Communication: None at bedside Disposition:  Status is: Inpatient  Consultants:   Cardiology  Procedures:     Antimicrobials:      Subjective:  Denies complaints.  Dyspnea resolved.  Able to sleep supine for about 4 hours last night.  No chest pain or palpitations.  Objective:   Vitals:   02/02/22 0000 02/02/22 0513 02/02/22 0754 02/02/22 1123  BP: 137/69 118/71 130/76 (!) 121/95  Pulse: 64 65 65 63  Resp: 16 16 16 16   Temp: 97.8 F (36.6 C) (!) 97.5 F (36.4 C) 97.9 F (36.6 C) 97.8 F (36.6 C)  TempSrc: Oral  Oral Oral  SpO2: 94% 94% 95% 94%  Weight:      Height:        General exam: Elderly male, moderately built and obese sitting up comfortably in bed without distress. Respiratory system: Clear to auscultation.  No increased work of breathing. Cardiovascular system: S1 & S2 heard, RRR. No JVD, murmurs, rubs, gallops or clicks.  No pedal edema.   Telemetry personally reviewed: Sinus rhythm. Gastrointestinal system: Abdomen is nondistended, soft and nontender. No organomegaly or masses felt. Normal bowel sounds heard. Central nervous system: Alert and oriented. No focal neurological deficits. Extremities: Symmetric 5 x 5 power. Skin: No rashes, lesions or ulcers Psychiatry: Judgement and insight appear normal. Mood & affect appropriate.     Data Reviewed:   I have personally reviewed following labs and imaging studies   CBC: Recent Labs  Lab 01/30/22 1344 01/31/22 0508 02/01/22 0726 02/02/22 0538  WBC 9.4 9.7 11.1* 10.0  NEUTROABS 6.4  --   --   --   HGB 12.8* 12.5* 13.7 12.7*  HCT 37.7* 38.5* 41.0 38.0*  MCV 91.0 93.7 89.7 89.8  PLT 285.0 284 282 320    Basic Metabolic Panel: Recent Labs  Lab 01/30/22 1344 01/31/22 0508 02/01/22 0726 02/02/22 0538  NA 136 138 135 135  K 4.6 4.4 4.2 4.0  CL 101 101 96* 98  CO2 30 31 28 31   GLUCOSE 166* 186* 192* 185*  BUN 17 17 27* 36*  CREATININE 0.83 0.99 1.52* 1.46*  CALCIUM 9.5 8.8* 8.9 8.6*  MG  --  2.0 2.0  --     Liver Function Tests: Recent Labs  Lab 01/30/22 1344  AST 21  ALT 21  ALKPHOS 47  BILITOT 0.7  PROT 7.5  ALBUMIN 4.1    CBG: Recent Labs  Lab 02/01/22 2004 02/02/22 0753 02/02/22 1124  GLUCAP 181* 173* 187*    Microbiology Studies:   Recent Results (from the past 240 hour(s))  Resp Panel by RT-PCR (Flu A&B, Covid) Nasopharyngeal Swab     Status: None   Collection Time: 01/30/22  7:12 PM   Specimen: Nasopharyngeal Swab; Nasopharyngeal(NP) swabs in vial transport medium  Result Value Ref Range Status   SARS Coronavirus 2 by RT PCR NEGATIVE NEGATIVE Final    Comment: (NOTE) SARS-CoV-2 target nucleic acids are NOT DETECTED.  The SARS-CoV-2 RNA is generally detectable in upper respiratory specimens during the acute phase of infection. The lowest concentration of SARS-CoV-2 viral copies this assay can detect is 138 copies/mL. A negative  result does not preclude SARS-Cov-2 infection and should not be used as the sole basis for treatment or other patient management decisions. A negative result may occur with  improper specimen collection/handling, submission of specimen other than nasopharyngeal swab, presence of viral mutation(s) within the areas targeted by this assay, and inadequate number of viral copies(<138 copies/mL). A negative result must be combined with clinical observations, patient history, and epidemiological information. The expected result is Negative.  Fact Sheet for Patients:  04/01/22  Fact Sheet for Healthcare Providers:  BloggerCourse.com  This test is no t yet approved or cleared by the [place] FDA and  has been authorized for detection and/or diagnosis of SARS-CoV-2 by FDA under an Emergency Use Authorization (EUA). This EUA will remain  in effect (meaning this test can be used) for the duration of the COVID-19 declaration under Section 564(b)(1) of the Act, 21 U.S.C.section 360bbb-3(b)(1), unless the authorization is terminated  or revoked sooner.  Influenza A by PCR NEGATIVE NEGATIVE Final   Influenza B by PCR NEGATIVE NEGATIVE Final    Comment: (NOTE) The Xpert Xpress SARS-CoV-2/FLU/RSV plus assay is intended as an aid in the diagnosis of influenza from Nasopharyngeal swab specimens and should not be used as a sole basis for treatment. Nasal washings and aspirates are unacceptable for Xpert Xpress SARS-CoV-2/FLU/RSV testing.  Fact Sheet for Patients: BloggerCourse.comhttps://www.fda.gov/media/152166/download  Fact Sheet for Healthcare Providers: SeriousBroker.ithttps://www.fda.gov/media/152162/download  This test is not yet approved or cleared by the [place] FDA and has been authorized for detection and/or diagnosis of SARS-CoV-2 by FDA under an Emergency Use Authorization (EUA). This EUA will remain in effect (meaning this test can be used)  for the duration of the COVID-19 declaration under Section 564(b)(1) of the Act, 21 U.S.C. section 360bbb-3(b)(1), unless the authorization is terminated or revoked.  Performed at [facility], [address] [place], [place] [address]     Radiology Studies:  No results found.  Scheduled Meds:    amiodarone  400 mg Oral BID   Followed by   Melene Muller[START ON 02/09/2022] amiodarone  200 mg Oral Daily   aspirin  81 mg Oral Daily   finasteride  5 mg Oral Daily   insulin aspart  0-6 Units Subcutaneous TID WC   metoprolol succinate  12.5 mg Oral Daily   rosuvastatin  20 mg Oral Daily   sodium chloride flush  3 mL Intravenous Q12H    Continuous Infusions:    heparin 1,800 Units/hr (02/02/22 1409)     LOS: 2 days     Marcellus ScottAnand Kingsten Enfield, MD,  FACP, [facility], [facility], [facility] (Care Management Physician Certified) [facility] Ad[facility] [place]  To contact the attending provider between 7A-7P or the covering provider during after hours 7P-7A, please log into the web site [email or web address] and access using universal [place] password for that web site. If you do not have the password, please call the hospital operator.  02/02/2022, 2:39 PM

## 2022-02-03 ENCOUNTER — Other Ambulatory Visit (HOSPITAL_COMMUNITY): Payer: Self-pay

## 2022-02-03 ENCOUNTER — Telehealth: Payer: Self-pay

## 2022-02-03 LAB — BASIC METABOLIC PANEL
Anion gap: 7 (ref 5–15)
BUN: 24 mg/dL — ABNORMAL HIGH (ref 8–23)
CO2: 30 mmol/L (ref 22–32)
Calcium: 9.1 mg/dL (ref 8.9–10.3)
Chloride: 100 mmol/L (ref 98–111)
Creatinine, Ser: 1.09 mg/dL (ref 0.61–1.24)
GFR, Estimated: >60 mL/min (ref >60)
Glucose, Bld: 168 mg/dL — ABNORMAL HIGH (ref 70–99)
Potassium: 4.4 mmol/L (ref 3.5–5.1)
Sodium: 137 mmol/L (ref 135–145)

## 2022-02-03 LAB — CBC
HCT: 40.6 % (ref 39.0–52.0)
Hemoglobin: 13.7 g/dL (ref 13.0–17.0)
MCH: 30.2 pg (ref 26.0–34.0)
MCHC: 33.7 g/dL (ref 30.0–36.0)
MCV: 89.4 fL (ref 80.0–100.0)
Platelets: 359 10*3/uL (ref 150–400)
RBC: 4.54 MIL/uL (ref 4.22–5.81)
RDW: 13 % (ref 11.5–15.5)
WBC: 11.4 10*3/uL — ABNORMAL HIGH (ref 4.0–10.5)
nRBC: 0 % (ref 0.0–0.2)

## 2022-02-03 LAB — GLUCOSE, CAPILLARY: Glucose-Capillary: 161 mg/dL — ABNORMAL HIGH (ref 70–99)

## 2022-02-03 LAB — HEPARIN LEVEL (UNFRACTIONATED): Heparin Unfractionated: 0.34 IU/mL (ref 0.30–0.70)

## 2022-02-03 MED ORDER — LOSARTAN POTASSIUM 25 MG PO TABS
25.0000 mg | ORAL_TABLET | Freq: Every day | ORAL | Status: DC
  Administered 2022-02-03: 25 mg via ORAL
  Filled 2022-02-03: qty 1

## 2022-02-03 MED ORDER — LOSARTAN POTASSIUM 25 MG PO TABS: 25.0000 mg | ORAL_TABLET | Freq: Every day | ORAL | 1 refills | Status: DC

## 2022-02-03 MED ORDER — METFORMIN HCL 500 MG PO TABS: 500.0000 mg | ORAL_TABLET | Freq: Every day | ORAL | 0 refills | Status: DC

## 2022-02-03 MED ORDER — APIXABAN 5 MG PO TABS: 5.0000 mg | ORAL_TABLET | Freq: Two times a day (BID) | ORAL | 1 refills | Status: AC

## 2022-02-03 MED ORDER — FUROSEMIDE 20 MG PO TABS
20.0000 mg | ORAL_TABLET | Freq: Every day | ORAL | Status: DC
  Administered 2022-02-03: 20 mg via ORAL
  Filled 2022-02-03: qty 1

## 2022-02-03 MED ORDER — AMIODARONE HCL 400 MG PO TABS
400.0000 mg | ORAL_TABLET | Freq: Two times a day (BID) | ORAL | 0 refills | Status: DC
Start: 2022-02-03 — End: 2022-02-13

## 2022-02-03 MED ORDER — AMIODARONE HCL 200 MG PO TABS: 200.0000 mg | ORAL_TABLET | Freq: Every day | ORAL | 1 refills | Status: AC

## 2022-02-03 MED ORDER — APIXABAN 5 MG PO TABS: 5.0000 mg | ORAL_TABLET | Freq: Two times a day (BID) | ORAL | Status: DC

## 2022-02-03 MED ORDER — METOPROLOL SUCCINATE ER 25 MG PO TB24: 12.5000 mg | ORAL_TABLET | Freq: Every day | ORAL | 0 refills | Status: DC

## 2022-02-03 NOTE — Discharge Summary (Signed)
Physician Discharge Summary  Adam Jennings ZOX:[id number] DOB: [birth year]  PCP: Doreene Nest, NP  Admitted from: Home Discharged to: Home  Admit date: 01/30/2022 Discharge date: 02/03/2022  Recommendations for Outpatient Follow-up:    Follow-up Information     Doreene Nest, NP. Schedule an appointment as soon as possible for a visit in 1 week(s).   Specialty: Internal Medicine Why: To be seen with repeat labs (CBC & BMP). Contact information: [address] [place] [address] [phone]         Armando Reichert, MD Follow up.   Specialty: Cardiology Why: MDs office will arrange outpatient follow-up to be seen in 1 to 2 weeks. Contact information: [address] [place] [address] [phone]                  Home Health: None    Equipment/Devices: None    Discharge Condition: Improved and stable.   Code Status: Full Code Diet recommendation:  Discharge Diet Orders (From admission, onward)     Start     Ordered   02/03/22 0000  Diet - low sodium heart healthy        02/03/22 0853   02/03/22 0000  Diet Carb Modified        02/03/22 0853             Discharge Diagnoses:  Principal Problem:   Acute systolic CHF (congestive heart failure) (HCC) Active Problems:   Atrial fibrillation with RVR (HCC)   AKI (acute kidney injury) (HCC)   Elevated troponin   Essential hypertension   Type II diabetes mellitus (HCC)   Hyperlipidemia   Brief Summary: 76 year old married male, works with heating and air, independent, medical history significant for hypertension, diet-controlled DM2, obesity, presented to the ED with 1 week history of progressive exertional dyspnea, orthopnea (has been sleeping in the chair) and leg edema without chest pain.  Seen by his PCP on day of admission, sent for outpatient blood work, given a prescription for Lasix which she had not started but presented to the ED after D-dimer and troponin came  back elevated.  Admitted for suspected new onset acute systolic CHF.  Course complicated by new onset A-fib with RVR and AKI related to diuresis.  Cardiology consulted.  Clinically improved.  Cardiology has seen and cleared for discharge.  They will arrange outpatient follow-up.  Assessment and Plan: * Acute systolic CHF (congestive heart failure) (HCC) New onset 2D echo 3/10: LVEF 40-45% and LV global hypokinesis.  Grade 1 diastolic dysfunction. Treated with IV Lasix 40 mg twice daily 48 hours, developed AKI and IV Lasix discontinued. -2.5 L thus far and weight down from 308 pounds on admission to 295 pounds on day of discharge. For elevated D-dimer, underwent CTA chest which was negative for PE.  No clinical pneumonia or COPD exacerbation. Clinically improved and euvolemic. Cardiology follow-up appreciated.  Toprol-XL 12.5 Mg daily initiated.  Cardiology has seen today.  Have initiated home dose of Lasix 20 mg daily.  Have initiated losartan 25 Mg daily, cleared him for discharge and will arrange outpatient follow-up.  Atrial fibrillation with RVR (HCC) Overnight 3/10, developed A-fib with RVR. Briefly on IV Cardizem drip, caused hypotension and worsening dyspnea, appropriately discontinued in the context of low EF. Started on amiodarone drip and IV heparin.  Converted to sinus rhythm. Cardiology follow-up appreciated.  Amiodarone switched to p.o. 400 Mg twice daily x7 days then 200 Mg daily. Also continue IV heparin  for now transitioned to Eliquis at discharge by cardiology.   CHA2DS2-VASc score: 4. Also initiated on Toprol-XL 12.5 Mg daily.  Cardiology has seen and cleared for discharge home.  AKI (acute kidney injury) (HCC) Presented with creatinine of 0.83 Worsened to 1.52 on 3/11 in the context of IV Lasix, lisinopril, IV contrast on admission and hypotension. Discontinued IV Lasix and lisinopril. Resolved. Follow-up BMP as outpatient.  Elevated troponin Suspecting demand  ischemia related to acute CHF. Follow 2D echo. No anginal symptoms.  Essential hypertension Lisinopril held due to AKI. Toprol-XL initiated today and possibly starting losartan in AM.  Type II diabetes mellitus (HCC) A1c 7.8 Treated in the hospital with SSI. Consider Farxiga as outpatient. Initiated metformin 500 Mg daily. Reasonable inpatient control.  Hyperlipidemia Continue rosuvastatin.   Body mass index is 40.06 kg/m.  Obesity    Consultations: Cardiology  Procedures: None   Discharge Instructions  Discharge Instructions     (HEART FAILURE PATIENTS) Call MD:  Anytime you have any of the following symptoms: 1) 3 pound weight gain in 24 hours or 5 pounds in 1 week 2) shortness of breath, with or without a dry hacking cough 3) swelling in the hands, feet or stomach 4) if you have to sleep on extra pillows at night in order to breathe.   Complete by: As directed    Call MD for:  difficulty breathing, headache or visual disturbances   Complete by: As directed    Call MD for:  extreme fatigue   Complete by: As directed    Call MD for:  persistant dizziness or light-headedness   Complete by: As directed    Diet - low sodium heart healthy   Complete by: As directed    Diet Carb Modified   Complete by: As directed    Increase activity slowly   Complete by: As directed         Medication List     STOP taking these medications    lisinopril 20 MG tablet Commonly known as: ZESTRIL   meloxicam 15 MG tablet Commonly known as: MOBIC       TAKE these medications    amiodarone 400 MG tablet Commonly known as: PACERONE Take 1 tablet (400 mg total) by mouth 2 (two) times daily for 11 doses.   amiodarone 200 MG tablet Commonly known as: PACERONE Take 1 tablet (200 mg total) by mouth daily. Start taking on: February 09, 2022   apixaban 5 MG Tabs tablet Commonly known as: ELIQUIS Take 1 tablet (5 mg total) by mouth 2 (two) times daily.   finasteride 5 MG  tablet Commonly known as: PROSCAR TAKE 1 TABLET (5 MG TOTAL) BY MOUTH DAILY.   Fish Oil 1000 MG Caps Take 1,000 mg by mouth daily.   furosemide 20 MG tablet Commonly known as: LASIX Take 1 tablet (20 mg total) by mouth daily.   losartan 25 MG tablet Commonly known as: COZAAR Take 1 tablet (25 mg total) by mouth daily. Start taking on: February 04, 2022   metFORMIN 500 MG tablet Commonly known as: Glucophage Take 1 tablet (500 mg total) by mouth daily with breakfast.   metoprolol succinate 25 MG 24 hr tablet Commonly known as: TOPROL-XL Take 0.5 tablets (12.5 mg total) by mouth daily. Start taking on: February 04, 2022   rosuvastatin 20 MG tablet Commonly known as: CRESTOR Take 1 tablet (20 mg total) by mouth daily. For cholesterol. Have patient call the office!  No Known Allergies    Procedures/Studies: DG Chest 2 View  Result Date: 01/30/2022 CLINICAL DATA:  Dyspnea on exertion. EXAM: CHEST - 2 VIEW COMPARISON:  None. FINDINGS: Mild cardiomegaly is noted. Left lung is clear. Elevated right hemidiaphragm is noted. Mild right basilar atelectasis is noted with small pleural effusion. Bony thorax is unremarkable. IMPRESSION: Elevated right hemidiaphragm. Mild right basilar atelectasis is noted with small right pleural effusion. Electronically Signed   By: Lupita Raider M.D.   On: 01/30/2022 21:22   CT Angio Chest PE W and/or Wo Contrast  Result Date: 01/30/2022 CLINICAL DATA:  Pulmonary embolism suspected, D-dimer positive. Chest pain which radiates. EXAM: CT ANGIOGRAPHY CHEST WITH CONTRAST TECHNIQUE: Multidetector CT imaging of the chest was performed using the standard protocol during bolus administration of intravenous contrast. Multiplanar CT image reconstructions and MIPs were obtained to evaluate the vascular anatomy. RADIATION DOSE REDUCTION: This exam was performed according to the departmental dose-optimization program which includes automated exposure control,  adjustment of the mA and/or kV according to patient size and/or use of iterative reconstruction technique. CONTRAST:  OMNIPAQUE IOHEXOL 350 MG/ML SOLN COMPARISON:  None. FINDINGS: Cardiovascular: Satisfactory opacification of the pulmonary arteries to the segmental level. No evidence of pulmonary embolism. Aortic atherosclerosis without aneurysmal dilation. Mild left-sided cardiac enlargement. Small pericardial effusion. Mediastinum/Nodes: No supraclavicular adenopathy. No discrete thyroid nodule. No pathologically enlarged mediastinal, hilar or axillary lymph nodes. Lungs/Pleura: Diffuse bilateral bronchial wall thickening. Small right-sided pleural effusion with adjacent consolidation. Trace left pleural effusion with adjacent consolidation. Linear opacities in the right middle lobe and right lower lobe are favored to reflect atelectasis. Scattered right greater than left gas-filled cystic spaces may reflect pneumatoceles. Upper Abdomen: No acute abnormality. Musculoskeletal: Thoracic diffuse idiopathic skeletal hyperostosis. No acute osseous abnormality. Review of the MIP images confirms the above findings. IMPRESSION: 1. No evidence of acute pulmonary embolism. 2. Small right and trace left pleural effusions with adjacent consolidations which may reflect atelectasis versus infiltrate. 3. Diffuse bilateral bronchial wall thickening with linear atelectasis in the right middle and right lower lobes. 4. Small pericardial effusion. 5.  Aortic Atherosclerosis (ICD10-I70.0). Electronically Signed   By: Maudry Mayhew M.D.   On: 01/30/2022 18:56   ECHOCARDIOGRAM COMPLETE  Result Date: 01/31/2022    ECHOCARDIOGRAM REPORT   Patient Name:   TENZIN EDELMAN Date of Exam: 01/31/2022 Medical Rec #:  [id number]        Height:       72.0 in Accession #:    [id number]       Weight:       308.6 lb Date of Birth:  04/06/1946         BSA:          2.562 m Patient Age:    76 years         BP:           133/63 mmHg Patient  Gender: M                HR:           68 bpm. Exam Location:  [facility] Procedure: 2D Echo, Color Doppler, Cardiac Doppler and Intracardiac            Opacification Agent Indications:     R06.00 Dyspnea ; Elevated troponin  History:         Patient has no prior history of Echocardiogram examinations.  Signs/Symptoms:Dizziness/Lightheadedness and Shortness of                  Breath; Risk Factors:Hypertension and Diabetes.  Sonographer:     Humphrey Rolls Referring Phys:  [id number] Theadora Rama D Mahamed Zalewski Diagnosing Phys: Sena Slate  Sonographer Comments: Suboptimal apical window and no subcostal window. Image acquisition challenging due to patient body habitus. IMPRESSIONS  1. Left ventricular ejection fraction, by estimation, is 40 to 45%. The left ventricle has mildly decreased function. The left ventricle demonstrates global hypokinesis. The left ventricular internal cavity size was mildly dilated. There is mild left ventricular hypertrophy. Left ventricular diastolic parameters are consistent with Grade I diastolic dysfunction (impaired relaxation).  2. Right ventricular systolic function was not well visualized. The right ventricular size is not well visualized.  3. The mitral valve is normal in structure. Mild mitral valve regurgitation. No evidence of mitral stenosis.  4. The aortic valve is normal in structure. Aortic valve regurgitation is not visualized. No aortic stenosis is present.  5. Aortic dilatation noted. There is mild dilatation of the aortic root, measuring 38 mm. Conclusion(s)/Recommendation(s): Technically difficult study. Likely mild LV dysfunction. Unable to assess RV. FINDINGS  Left Ventricle: Left ventricular ejection fraction, by estimation, is 40 to 45%. The left ventricle has mildly decreased function. The left ventricle demonstrates global hypokinesis. Definity contrast agent was given IV to delineate the left ventricular  endocardial borders. The left ventricular internal cavity size was  mildly dilated. There is mild left ventricular hypertrophy. Left ventricular diastolic parameters are consistent with Grade I diastolic dysfunction (impaired relaxation). Right Ventricle: The right ventricular size is not well visualized. Right ventricular systolic function was not well visualized. Left Atrium: Left atrial size was normal in size. Right Atrium: Right atrial size was not well visualized. Pericardium: There is no evidence of pericardial effusion. Mitral Valve: The mitral valve is normal in structure. Mild mitral valve regurgitation. No evidence of mitral valve stenosis. MV peak gradient, 4.8 mmHg. The mean mitral valve gradient is 2.0 mmHg. Tricuspid Valve: The tricuspid valve is not well visualized. Aortic Valve: The aortic valve is normal in structure. Aortic valve regurgitation is not visualized. No aortic stenosis is present. Aortic valve mean gradient measures 5.0 mmHg. Aortic valve peak gradient measures 10.2 mmHg. Aortic valve area, by VTI measures 3.47 cm. Pulmonic Valve: The pulmonic valve was not well visualized. Pulmonic valve regurgitation is not visualized. No evidence of pulmonic stenosis. Aorta: Aortic dilatation noted. There is mild dilatation of the aortic root, measuring 38 mm. IAS/Shunts: The interatrial septum was not well visualized.  LEFT VENTRICLE PLAX 2D LVIDd:         5.96 cm      Diastology LVIDs:         5.06 cm      LV e' medial:    5.98 cm/s LV PW:         1.24 cm      LV E/e' medial:  14.0 LV IVS:        1.06 cm      LV e' lateral:   8.59 cm/s LVOT diam:     2.60 cm      LV E/e' lateral: 9.8 LV SV:         99 LV SV Index:   39 LVOT Area:     5.31 cm  LV Volumes (MOD) LV vol d, MOD A2C: 193.0 ml LV vol d, MOD A4C: 150.0 ml LV vol s, MOD A2C: 101.0 ml LV  vol s, MOD A4C: 90.9 ml LV SV MOD A2C:     92.0 ml LV SV MOD A4C:     150.0 ml LV SV MOD BP:      75.2 ml RIGHT VENTRICLE RV Basal diam:  4.47 cm LEFT ATRIUM             Index        RIGHT ATRIUM           Index LA diam:         4.70 cm 1.83 cm/m   RA Area:     14.80 cm LA Vol (A2C):   54.5 ml 21.27 ml/m  RA Volume:   31.20 ml  12.18 ml/m LA Vol (A4C):   41.3 ml 16.12 ml/m LA Biplane Vol: 50.1 ml 19.56 ml/m  AORTIC VALVE                     PULMONIC VALVE AV Area (Vmax):    3.58 cm      PV Vmax:       1.13 m/s AV Area (Vmean):   3.48 cm      PV Vmean:      75.800 cm/s AV Area (VTI):     3.47 cm      PV VTI:        0.228 m AV Vmax:           160.00 cm/s   PV Peak grad:  5.1 mmHg AV Vmean:          105.000 cm/s  PV Mean grad:  3.0 mmHg AV VTI:            0.286 m AV Peak Grad:      10.2 mmHg AV Mean Grad:      5.0 mmHg LVOT Vmax:         108.00 cm/s LVOT Vmean:        68.800 cm/s LVOT VTI:          0.187 m LVOT/AV VTI ratio: 0.65  AORTA Ao Root diam: 3.80 cm MITRAL VALVE MV Area (PHT): 3.31 cm    SHUNTS MV Area VTI:   2.63 cm    Systemic VTI:  0.19 m MV Peak grad:  4.8 mmHg    Systemic Diam: 2.60 cm MV Mean grad:  2.0 mmHg MV Vmax:       1.09 m/s MV Vmean:      74.9 cm/s MV Decel Time: 229 msec MV E velocity: 84.00 cm/s MV A velocity: 94.70 cm/s MV E/A ratio:  0.89 Sena Slateyan Orgel Electronically signed by Sena Slateyan Orgel Signature Date/Time: 01/31/2022/5:47:35 PM    Final       Subjective: Denies complaints.  No dyspnea, orthopnea or chest pain.  Ambulating comfortably and steadily in the room.  No leg swellings.  Discharge Exam:  Vitals:   02/02/22 2007 02/03/22 0018 02/03/22 0400 02/03/22 0756  BP: (!) 142/70 123/64 138/75 136/76  Pulse: 64 65 67 62  Resp: (!) 21 (!) 22 20 16   Temp: 98.2 F (36.8 C) 98 F (36.7 C) 98.7 F (37.1 C) 97.6 F (36.4 C)  TempSrc: Oral Oral Oral Oral  SpO2: 93% 95% 93% 94%  Weight:   134 kg   Height:        General exam: Elderly male, moderately built and obese sitting up comfortably in bed without distress. Respiratory system: Clear to auscultation.  No increased work of breathing. Cardiovascular system: S1 & S2 heard, RRR. No  JVD, murmurs, rubs, gallops or clicks.  No pedal edema.   Telemetry personally reviewed: Sinus rhythm. Gastrointestinal system: Abdomen is nondistended, soft and nontender. No organomegaly or masses felt. Normal bowel sounds heard. Central nervous system: Alert and oriented. No focal neurological deficits. Extremities: Symmetric 5 x 5 power. Skin: No rashes, lesions or ulcers Psychiatry: Judgement and insight appear normal. Mood & affect appropriate.     The results of significant diagnostics from this hospitalization (including imaging, microbiology, ancillary and laboratory) are listed below for reference.     Microbiology: Recent Results (from the past 240 hour(s))  Resp Panel by RT-PCR (Flu A&B, Covid) Nasopharyngeal Swab     Status: None   Collection Time: 01/30/22  7:12 PM   Specimen: Nasopharyngeal Swab; Nasopharyngeal(NP) swabs in vial transport medium  Result Value Ref Range Status   SARS Coronavirus 2 by RT PCR NEGATIVE NEGATIVE Final    Comment: (NOTE) SARS-CoV-2 target nucleic acids are NOT DETECTED.  The SARS-CoV-2 RNA is generally detectable in upper respiratory specimens during the acute phase of infection. The lowest concentration of SARS-CoV-2 viral copies this assay can detect is 138 copies/mL. A negative result does not preclude SARS-Cov-2 infection and should not be used as the sole basis for treatment or other patient management decisions. A negative result may occur with  improper specimen collection/handling, submission of specimen other than nasopharyngeal swab, presence of viral mutation(s) within the areas targeted by this assay, and inadequate number of viral copies(<138 copies/mL). A negative result must be combined with clinical observations, patient history, and epidemiological information. The expected result is Negative.  Fact Sheet for Patients:  BloggerCourse.com  Fact Sheet for Healthcare Providers:  SeriousBroker.it  This test is no t yet approved  or cleared by the [place] FDA and  has been authorized for detection and/or diagnosis of SARS-CoV-2 by FDA under an Emergency Use Authorization (EUA). This EUA will remain  in effect (meaning this test can be used) for the duration of the COVID-19 declaration under Section 564(b)(1) of the Act, 21 U.S.C.section 360bbb-3(b)(1), unless the authorization is terminated  or revoked sooner.       Influenza A by PCR NEGATIVE NEGATIVE Final   Influenza B by PCR NEGATIVE NEGATIVE Final    Comment: (NOTE) The Xpert Xpress SARS-CoV-2/FLU/RSV plus assay is intended as an aid in the diagnosis of influenza from Nasopharyngeal swab specimens and should not be used as a sole basis for treatment. Nasal washings and aspirates are unacceptable for Xpert Xpress SARS-CoV-2/FLU/RSV testing.  Fact Sheet for Patients: BloggerCourse.com  Fact Sheet for Healthcare Providers: SeriousBroker.it  This test is not yet approved or cleared by the [place] FDA and has been authorized for detection and/or diagnosis of SARS-CoV-2 by FDA under an Emergency Use Authorization (EUA). This EUA will remain in effect (meaning this test can be used) for the duration of the COVID-19 declaration under Section 564(b)(1) of the Act, 21 U.S.C. section 360bbb-3(b)(1), unless the authorization is terminated or revoked.  Performed at [facility], [address] [place], [place] [address]      Labs: CBC: Recent Labs  Lab 01/30/22 1344 01/31/22 0508 02/01/22 0726 02/02/22 0538 02/03/22 0439  WBC 9.4 9.7 11.1* 10.0 11.4*  NEUTROABS 6.4  --   --   --   --   HGB 12.8* 12.5* 13.7 12.7* 13.7  HCT 37.7* 38.5* 41.0 38.0* 40.6  MCV 91.0 93.7 89.7 89.8 89.4  PLT 285.0 284 282 320 359    Basic  Metabolic Panel: Recent Labs  Lab 01/30/22 1344 01/31/22 0508 02/01/22 0726 02/02/22 0538 02/03/22 0439  NA 136 138 135 135 137  K 4.6 4.4 4.2 4.0 4.4   CL 101 101 96* 98 100  CO2 GLUCOSE 166* 186* 192* 185* 168*  BUN 17 17 27* 36* 24*  CREATININE 0.83 0.99 1.52* 1.46* 1.09  CALCIUM 9.5 8.8* 8.9 8.6* 9.1  MG  --  2.0 2.0  --   --     Liver Function Tests: Recent Labs  Lab 01/30/22 1344  AST 21  ALT 21  ALKPHOS 47  BILITOT 0.7  PROT 7.5  ALBUMIN 4.1    CBG: Recent Labs  Lab 02/02/22 0753 02/02/22 1124 02/02/22 1640 02/02/22 2030 02/03/22 0759  GLUCAP 173* 187* 130* 259* 161*   Discussed with patient's daughter, updated care and answered all questions.   Time coordinating discharge: 35 minutes  SIGNED:  Marcellus Scott, MD,  FACP, [facility], [facility], [facility] (Care Management Physician Certified). [facility]  To contact the attending provider between 7A-7P or the covering provider during after hours 7P-7A, please log into the web site [email or web address] and access using universal  password for that web site. If you do not have the password, please call the hospital operator.

## 2022-02-03 NOTE — Discharge Instructions (Signed)

## 2022-02-03 NOTE — Consult Note (Signed)
ANTICOAGULATION CONSULT NOTE  ? ?Pharmacy Consult for heparin infusion ?Indication: atrial fibrillation ? ?No Known Allergies ? ?Patient Measurements: ?Height: 6' (182.9 cm) ?Weight: 134.4 kg (296 lb 3.2 oz) ?IBW/kg (Calculated) : 77.6 ?Heparin Dosing Weight: 109.9 kg  ? ?Vital Signs: ?Temp: 98 ?F (36.7 ?C) (03/13 0018) ?Temp Source: Oral (03/13 0018) ?BP: 123/64 (03/13 0018) ?Pulse Rate: 65 (03/13 0018) ? ?Labs: ?Recent Labs  ?  01/31/22 ?5102 01/31/22 ?5852 01/31/22 ?2157 01/31/22 ?2326 02/01/22 ?0726 02/01/22 ?1707 02/02/22 ?0123 02/02/22 ?7782 02/02/22 ?0950  ?HGB 12.5*  --   --   --  13.7  --   --  12.7*  --   ?HCT 38.5*  --   --   --  41.0  --   --  38.0*  --   ?PLT 284  --   --   --  282  --   --  320  --   ?HEPARINUNFRC  --    < >  --   --  0.10* 0.36 0.39  --  0.37  ?CREATININE 0.99  --   --   --  1.52*  --   --  1.46*  --   ?TROPONINIHS  --   --  60* 61*  --   --   --   --   --   ? < > = values in this interval not displayed.  ? ? ? ?Estimated Creatinine Clearance: 63 mL/min (A) (by C-G formula based on SCr of 1.46 mg/dL (H)). ? ? ?Medical History: ?Past Medical History:  ?Diagnosis Date  ? Dizziness   ? Elevated blood pressure   ? ? ?Medications:  ?Enoxaparin 70 mg SQ Q24H, last dose 3/9 2300 ? ?Assessment: ?76 year old male, presented to the ED 01/30/22 with 1 week history of progressive exertional dyspnea, orthopnea found to have new onset CHF (EF 40-45%). New onset Afib developed 01/31/22 evening. Pharmacy has been consulted for initiation and management of heparin infusion. CHADSVASc 4. H&H, platelets stable ? ?Goal of Therapy:  ?Heparin level 0.3-0.7 units/ml ?Monitor platelets by anticoagulation protocol: Yes ?  ?Plan:  ?Heparin level remains therapeutic. Will continue heparin infusion at 1800 units/hr. Recheck heparin level and CBC with AM labs.   ? ?Burnis Medin, PharmD, BCPS ?02/03/2022 ?1:30 AM ? ? ? ?

## 2022-02-03 NOTE — Progress Notes (Signed)
[facility]  [facility]ARDIOLOGY CONSULT NOTE  Patient ID: Gunter Conde MRN: [id number] DOB/AGE: 1946-04-17 76 y.o.  Admit date: 01/30/2022 Referring Physician Marcellus Scott Primary Physician Doreene Nest, NP Primary Cardiologist NA Reason for Consultation New diagnosis HFrEF  HPI:  Indiana Pechacek is a 76 year old male with a history of hypertension, HLD, obesity who presented to the emergency department with shortness of breath.  He was discovered to have newly decreased ejection fraction of 40 to 45% and new onset AF prompting cardiology consultation.  Interval history: - No acute events overnight. - Feels good this morning and is eager to go home.  - Denies shortness of breath or chest pain.   Social - Prior cigar smoker, quit 40 years ago  Review of systems complete and found to be negative unless listed above     Past Medical History:  Diagnosis Date   Dizziness    Elevated blood pressure     Past Surgical History:  Procedure Laterality Date   CATARACT EXTRACTION W/ INTRAOCULAR LENS IMPLANT Right 11/05/2017   CATARACT EXTRACTION W/ INTRAOCULAR LENS IMPLANT Left 12/08/2017    Medications Prior to Admission  Medication Sig Dispense Refill Last Dose   finasteride (PROSCAR) 5 MG tablet TAKE 1 TABLET (5 MG TOTAL) BY MOUTH DAILY. 90 tablet 3 01/30/2022   furosemide (LASIX) 20 MG tablet Take 1 tablet (20 mg total) by mouth daily. 20 tablet 0 01/30/2022   lisinopril (ZESTRIL) 20 MG tablet Take 1 tablet (20 mg total) by mouth daily. For blood pressure. 90 tablet 3 01/30/2022   meloxicam (MOBIC) 15 MG tablet Take 15 mg by mouth daily.   01/30/2022   Omega-3 Fatty Acids (FISH OIL) 1000 MG CAPS Take 1,000 mg by mouth daily.   01/30/2022   rosuvastatin (CRESTOR) 20 MG tablet Take 1 tablet (20 mg total) by mouth daily. For cholesterol. Have patient call the office! 90 tablet 0 01/30/2022   Social History   Socioeconomic History   Marital status: Married    Spouse name: Not on file    Number of children: Not on file   Years of education: Not on file   Highest education level: Not on file  Occupational History   Not on file  Tobacco Use   Smoking status: Never    Passive exposure: Never   Smokeless tobacco: Never  Vaping Use   Vaping Use: Never used  Substance and Sexual Activity   Alcohol use: No    Alcohol/week: 0.0 standard drinks   Drug use: No   Sexual activity: Not Currently  Other Topics Concern   Not on file  Social History Narrative   Married.   1 daughter, 4 grandchildren.   Works in [employer] in [place].   Enjoys working on tractors.    Social Determinants of Health   Financial Resource Strain: Not on file  Food Insecurity: Not on file  Transportation Needs: Not on file  Physical Activity: Not on file  Stress: Not on file  Social Connections: Not on file  Intimate Partner Violence: Not on file    Family History  Problem Relation Age of Onset   Hypertension Father       Review of systems complete and found to be negative unless listed above      PHYSICAL EXAM  Vitals:   02/03/22 0400 02/03/22 0756  BP: 138/75 136/76  Pulse: 67 62  Resp: 20 16  Temp: 98.7 F (37.1 C) 97.6 F (36.4 C)  SpO2: 93% 94%  General: Well developed, well nourished, in no acute distress HEENT:  Normocephalic and atramatic Neck:  No JVD.  Lungs: Clear bilaterally to auscultation and percussion. Heart: HRRR . Normal S1 and S2 without gallops or murmurs.  Abdomen: Bowel sounds are positive, abdomen soft and non-tender  Msk:  Back normal, normal gait. Normal strength and tone for age. Extremities: No clubbing, cyanosis or edema.   Neuro: Alert and oriented X 3. Psych:  Good affect, responds appropriately  Labs:   Lab Results  Component Value Date   WBC 11.4 (H) 02/03/2022   HGB 13.7 02/03/2022   HCT 40.6 02/03/2022   MCV 89.4 02/03/2022   PLT 359 02/03/2022    Recent Labs  Lab 01/30/22 1344 01/31/22 0508 02/03/22 0439  NA 136   < > 137   K 4.6   < > 4.4  CL 101   < > 100  CO2 30   < > 30  BUN 17   < > 24*  CREATININE 0.83   < > 1.09  CALCIUM 9.5   < > 9.1  PROT 7.5  --   --   BILITOT 0.7  --   --   ALKPHOS 47  --   --   ALT 21  --   --   AST 21  --   --   GLUCOSE 166*   < > 168*   < > = values in this interval not displayed.    Lab Results  Component Value Date   TROPONINI <0.03 02/12/2016    Lab Results  Component Value Date   CHOL 177 11/26/2021   CHOL 172 10/23/2020   CHOL 149 08/14/2020   Lab Results  Component Value Date   HDL 32.50 (L) 11/26/2021   HDL 39.80 10/23/2020   HDL 30.40 (L) 08/14/2020   Lab Results  Component Value Date   LDLCALC 123 (H) 11/26/2021   LDLCALC 115 (H) 10/23/2020   LDLCALC 102 (H) 08/14/2020   Lab Results  Component Value Date   TRIG 105.0 11/26/2021   TRIG 86.0 10/23/2020   TRIG 84.0 08/14/2020   Lab Results  Component Value Date   CHOLHDL 5 11/26/2021   CHOLHDL 4 10/23/2020   CHOLHDL 5 08/14/2020   No results found for: LDLDIRECT    Radiology: DG Chest 2 View  Result Date: 01/30/2022 CLINICAL DATA:  Dyspnea on exertion. EXAM: CHEST - 2 VIEW COMPARISON:  None. FINDINGS: Mild cardiomegaly is noted. Left lung is clear. Elevated right hemidiaphragm is noted. Mild right basilar atelectasis is noted with small pleural effusion. Bony thorax is unremarkable. IMPRESSION: Elevated right hemidiaphragm. Mild right basilar atelectasis is noted with small right pleural effusion. Electronically Signed   By: Lupita RaiderJames  Green Jr M.D.   On: 01/30/2022 21:22   CT Angio Chest PE W and/or Wo Contrast  Result Date: 01/30/2022 CLINICAL DATA:  Pulmonary embolism suspected, D-dimer positive. Chest pain which radiates. EXAM: CT ANGIOGRAPHY CHEST WITH CONTRAST TECHNIQUE: Multidetector CT imaging of the chest was performed using the standard protocol during bolus administration of intravenous contrast. Multiplanar CT image reconstructions and MIPs were obtained to evaluate the vascular  anatomy. RADIATION DOSE REDUCTION: This exam was performed according to the departmental dose-optimization program which includes automated exposure control, adjustment of the mA and/or kV according to patient size and/or use of iterative reconstruction technique. CONTRAST:  100mL OMNIPAQUE IOHEXOL 350 MG/ML SOLN COMPARISON:  None. FINDINGS: Cardiovascular: Satisfactory opacification of the pulmonary arteries to the segmental level. No evidence  of pulmonary embolism. Aortic atherosclerosis without aneurysmal dilation. Mild left-sided cardiac enlargement. Small pericardial effusion. Mediastinum/Nodes: No supraclavicular adenopathy. No discrete thyroid nodule. No pathologically enlarged mediastinal, hilar or axillary lymph nodes. Lungs/Pleura: Diffuse bilateral bronchial wall thickening. Small right-sided pleural effusion with adjacent consolidation. Trace left pleural effusion with adjacent consolidation. Linear opacities in the right middle lobe and right lower lobe are favored to reflect atelectasis. Scattered right greater than left gas-filled cystic spaces may reflect pneumatoceles. Upper Abdomen: No acute abnormality. Musculoskeletal: Thoracic diffuse idiopathic skeletal hyperostosis. No acute osseous abnormality. Review of the MIP images confirms the above findings. IMPRESSION: 1. No evidence of acute pulmonary embolism. 2. Small right and trace left pleural effusions with adjacent consolidations which may reflect atelectasis versus infiltrate. 3. Diffuse bilateral bronchial wall thickening with linear atelectasis in the right middle and right lower lobes. 4. Small pericardial effusion. 5.  Aortic Atherosclerosis (ICD10-I70.0). Electronically Signed   By: Maudry Mayhew M.D.   On: 01/30/2022 18:56   ECHOCARDIOGRAM COMPLETE  Result Date: 01/31/2022    ECHOCARDIOGRAM REPORT   Patient Name:   VITOR OVERBAUGH Date of Exam: 01/31/2022 Medical Rec #:  [id number]        Height:       72.0 in Accession #:     [id number]       Weight:       308.6 lb Date of Birth:  01/15/1946         BSA:          2.562 m Patient Age:    76 years         BP:           133/63 mmHg Patient Gender: M                HR:           68 bpm. Exam Location:  [facility] Procedure: 2D Echo, Color Doppler, Cardiac Doppler and Intracardiac            Opacification Agent Indications:     R06.00 Dyspnea ; Elevated troponin  History:         Patient has no prior history of Echocardiogram examinations.                  Signs/Symptoms:Dizziness/Lightheadedness and Shortness of                  Breath; Risk Factors:Hypertension and Diabetes.  Sonographer:     Humphrey Rolls Referring Phys:  [id number] Theadora Rama D HONGALGI Diagnosing Phys: Sena Slate  Sonographer Comments: Suboptimal apical window and no subcostal window. Image acquisition challenging due to patient body habitus. IMPRESSIONS  1. Left ventricular ejection fraction, by estimation, is 40 to 45%. The left ventricle has mildly decreased function. The left ventricle demonstrates global hypokinesis. The left ventricular internal cavity size was mildly dilated. There is mild left ventricular hypertrophy. Left ventricular diastolic parameters are consistent with Grade I diastolic dysfunction (impaired relaxation).  2. Right ventricular systolic function was not well visualized. The right ventricular size is not well visualized.  3. The mitral valve is normal in structure. Mild mitral valve regurgitation. No evidence of mitral stenosis.  4. The aortic valve is normal in structure. Aortic valve regurgitation is not visualized. No aortic stenosis is present.  5. Aortic dilatation noted. There is mild dilatation of the aortic root, measuring 38 mm. Conclusion(s)/Recommendation(s): Technically difficult study. Likely mild LV dysfunction. Unable to assess RV. FINDINGS  Left Ventricle: Left ventricular ejection  fraction, by estimation, is 40 to 45%. The left ventricle has mildly decreased function. The left ventricle  demonstrates global hypokinesis. Definity contrast agent was given IV to delineate the left ventricular  endocardial borders. The left ventricular internal cavity size was mildly dilated. There is mild left ventricular hypertrophy. Left ventricular diastolic parameters are consistent with Grade I diastolic dysfunction (impaired relaxation). Right Ventricle: The right ventricular size is not well visualized. Right ventricular systolic function was not well visualized. Left Atrium: Left atrial size was normal in size. Right Atrium: Right atrial size was not well visualized. Pericardium: There is no evidence of pericardial effusion. Mitral Valve: The mitral valve is normal in structure. Mild mitral valve regurgitation. No evidence of mitral valve stenosis. MV peak gradient, 4.8 mmHg. The mean mitral valve gradient is 2.0 mmHg. Tricuspid Valve: The tricuspid valve is not well visualized. Aortic Valve: The aortic valve is normal in structure. Aortic valve regurgitation is not visualized. No aortic stenosis is present. Aortic valve mean gradient measures 5.0 mmHg. Aortic valve peak gradient measures 10.2 mmHg. Aortic valve area, by VTI measures 3.47 cm. Pulmonic Valve: The pulmonic valve was not well visualized. Pulmonic valve regurgitation is not visualized. No evidence of pulmonic stenosis. Aorta: Aortic dilatation noted. There is mild dilatation of the aortic root, measuring 38 mm. IAS/Shunts: The interatrial septum was not well visualized.  LEFT VENTRICLE PLAX 2D LVIDd:         5.96 cm      Diastology LVIDs:         5.06 cm      LV e' medial:    5.98 cm/s LV PW:         1.24 cm      LV E/e' medial:  14.0 LV IVS:        1.06 cm      LV e' lateral:   8.59 cm/s LVOT diam:     2.60 cm      LV E/e' lateral: 9.8 LV SV:         99 LV SV Index:   39 LVOT Area:     5.31 cm  LV Volumes (MOD) LV vol d, MOD A2C: 193.0 ml LV vol d, MOD A4C: 150.0 ml LV vol s, MOD A2C: 101.0 ml LV vol s, MOD A4C: 90.9 ml LV SV MOD A2C:     92.0  ml LV SV MOD A4C:     150.0 ml LV SV MOD BP:      75.2 ml RIGHT VENTRICLE RV Basal diam:  4.47 cm LEFT ATRIUM             Index        RIGHT ATRIUM           Index LA diam:        4.70 cm 1.83 cm/m   RA Area:     14.80 cm LA Vol (A2C):   54.5 ml 21.27 ml/m  RA Volume:   31.20 ml  12.18 ml/m LA Vol (A4C):   41.3 ml 16.12 ml/m LA Biplane Vol: 50.1 ml 19.56 ml/m  AORTIC VALVE                     PULMONIC VALVE AV Area (Vmax):    3.58 cm      PV Vmax:       1.13 m/s AV Area (Vmean):   3.48 cm      PV Vmean:      75.800 cm/s  AV Area (VTI):     3.47 cm      PV VTI:        0.228 m AV Vmax:           160.00 cm/s   PV Peak grad:  5.1 mmHg AV Vmean:          105.000 cm/s  PV Mean grad:  3.0 mmHg AV VTI:            0.286 m AV Peak Grad:      10.2 mmHg AV Mean Grad:      5.0 mmHg LVOT Vmax:         108.00 cm/s LVOT Vmean:        68.800 cm/s LVOT VTI:          0.187 m LVOT/AV VTI ratio: 0.65  AORTA Ao Root diam: 3.80 cm MITRAL VALVE MV Area (PHT): 3.31 cm    SHUNTS MV Area VTI:   2.63 cm    Systemic VTI:  0.19 m MV Peak grad:  4.8 mmHg    Systemic Diam: 2.60 cm MV Mean grad:  2.0 mmHg MV Vmax:       1.09 m/s MV Vmean:      74.9 cm/s MV Decel Time: 229 msec MV E velocity: 84.00 cm/s MV A velocity: 94.70 cm/s MV E/A ratio:  0.89 Sena Slate Electronically signed by Sena Slate Signature Date/Time: 01/31/2022/5:47:35 PM    Final     EKG: Sinus rhythm with PACs, right bundle branch block and left anterior fascicular block.  LVH with repolarization abnormality.  ASSESSMENT AND PLAN:  Webster Patrone is a 76 year old male with a history of hypertension, HLD, obesity who presented to the emergency department with shortness of breath.  He was discovered to have newly decreased ejection fraction of 40 to 45% prompting cardiology consultation.  # New diagnosis HFrEF # Bifascicular Block (RBBB, LAFB) # Acute kidney injury Patient presents with 2 to 3 days of worsening shortness of breath, and subsequently discovered to have  an EF of 40 to 45% with a mildly dilated left ventricle.  His dilation indicates this is likely a chronic condition, probably ongoing for some time.  He feels better after diuresis but renal function worsened. Titrating GDMT.  - Continue metoprolol XL 12.5 mg daily - Start losartan 25 mg daily -S/p Lasix 40 IV twice daily 3/10, and 1 dose 3/11 --> AKI. He is on RA and feels improved - Start lasix 20 mg PO daily -Patient will require an ischemic evaluation.  This can be completed as an outpatient.   # ? New onset AF Developed A-fib with RVR overnight on 01/31/2022 for which diltiazem caused significant hypotension and worsening shortness of breath, and he was eventually converted to sinus rhythm with IV amiodarone. -CHA2DS2-VASc score =4 -Start eliquis 5 mg BID this morning -PO amiodarone 400 mg BID today for 7 days (ending 3/19), then 200 mg daily (starting 3/20).  Patient appears stable for discharge home. Will plan for follow up with me in clinic in 1-2 weeks (I have asked scheduler to make appointment and provided patient with card), and will perform ischemic evaluation at that time.    Signed: Armando Reichert MD 02/03/2022, 8:01 AM

## 2022-02-03 NOTE — Telephone Encounter (Signed)
Amy, please add patient to one of my blocked slots, either 3:20 or 3:40  ?

## 2022-02-03 NOTE — Care Management Important Message (Signed)
Important Message ? ?Patient Details  ?Name: Adam Jennings ?MRN: [id number] ?Date of Birth: 15-Sep-1946 ? ? ?Medicare Important Message Given:  Yes ? ? ? ? ?[name] ?02/03/2022, 11:55 AM ?

## 2022-02-03 NOTE — Telephone Encounter (Signed)
Pt will be d/c from hospital today for CHF. Needs HFU. I could not find a spot. Will forward to Marland Kitchen to help get him scheduled. Call pt at [phone] ?

## 2022-02-03 NOTE — Progress Notes (Addendum)
TOC consulted for eliquis coupon card. Since patient has [facility] insurance patient to receive $10 copay card.  ? ?TOC has printed co pay discount card for Eliquis to floor RN made aware to add to patient's discharge packet for today.  ? ?No further dc needs identified at this time.  ? Angeline Slim, [place] ?[phone] ? ?

## 2022-02-03 NOTE — Progress Notes (Signed)
? ?  Heart Failure Nurse Navigator Note ? ?Met with patient this morning, he is being discharged home. ? ?By teach back method discussed how he is going to take care of himself after discharge.  He plans on getting a calendar to record his weight, needed little re enforcement. ? ?Also made aware of the [facility] program, given info sheet.  States at this time he wants to think about it.   ? ?No further questions. ? ?Jimsey Shaffer RN [facility] ? ?

## 2022-02-04 ENCOUNTER — Telehealth: Payer: Self-pay

## 2022-02-04 NOTE — Telephone Encounter (Addendum)
Transition Care Management Unsuccessful Follow-up Telephone Call ? ?Date of discharge and from where:  TCM DC [facility] 02-03-22 Dx: acute systolic CHF ? ?Attempts:  1st Attempt ? ?Reason for unsuccessful TCM follow-up call:  Left voice message ? ?Transition Care Management Unsuccessful Follow-up Telephone Call ? ?Date of discharge and from where:  TCM DC [facility] 02-03-22 Dx: acute systolic CHF ? ?Attempts:  2nd Attempt ? ?Reason for unsuccessful TCM follow-up call:  Left voice message ? ?Transition Care Management Unsuccessful Follow-up Telephone Call ? ?Date of discharge and from where:  TCM DC [facility] 02-03-22 Dx: acute systolic CHF ? ?Attempts:  3rd Attempt ? ?Reason for unsuccessful TCM follow-up call:  Left voice message ? ?  ? ?  ? ?  ?

## 2022-02-04 NOTE — Telephone Encounter (Incomplete Revision)
Transition Care Management Unsuccessful Follow-up Telephone Call ? ?Date of discharge and from where:  TCM DC [facility] 02-03-22 Dx: acute systolic CHF ? ?Attempts:  1st Attempt ? ?Reason for unsuccessful TCM follow-up call:  Left voice message ? ?Transition Care Management Unsuccessful Follow-up Telephone Call ? ?Date of discharge and from where:  TCM DC [facility] 02-03-22 Dx: acute systolic CHF ? ?Attempts:  2nd Attempt ? ?Reason for unsuccessful TCM follow-up call:  Left voice message ? ?  ? ?  ?

## 2022-02-06 NOTE — Telephone Encounter (Signed)
Patient's daughter called, discussed availability and they are keeping scheduled appointment already on 02/18/22 at 7:20 am. Patient has cardiology appointment next week and patient is doing well at this time. ?

## 2022-02-11 NOTE — Progress Notes (Signed)
? Patient ID: Adam Jennings, male    DOB: [birth year], 76 y.o.   MRN: [id number] ? ?HPI ? ?Adam Jennings is a 76 y/o male with a history of atrial fibrillation, DM, HTN, BPH and chronic heart failure.  ? ?Echo report from 01/31/22 reviewed and showed an EF of 40-45% along with mild LVH and mild Adam. ? ?Admitted 01/30/22 due to 1 week history of progressive exertional dyspnea, orthopnea (has been sleeping in the chair) and leg edema without chest pain. Elevated d-dimer and troponin. Cardiology consult obtained due to new onset HF/ AF. Initially given IV lasix with transition to oral diuretics. Chest CTA negative for PE. Started on amiodarone and heparin drip and then transition to oral medications. Elevated troponin thought to be due to demand ischemia. Lisinopril stopped due to AKI but then losartan was able to be started. Discharged after 4 days.  ? ?He presents today for his initial visit with a chief complaint of minimal shortness of breath upon moderate exertion. Describes this as chronic in nature and has been slowly improving since his recent admission. He has associated easy bruising and chronic difficulty sleeping along with this. He denies any dizziness, abdominal distention, palpitations, pedal edema, chest pain, cough, fatigue or weight gain.  ? ?Not adding salt and has been reading food labels for sodium content. Prior to this admission, he says that he was eating potato chips, vienna sausages and beenie weenies often and not realizing how much salt is in those products.  ? ?Past Medical History:  ?Diagnosis Date  ? Arrhythmia   ? atrial fibrillation  ? BPH (benign prostatic hyperplasia)   ? CHF (congestive heart failure) ([place])   ? Diabetes mellitus without complication ([place])   ? Dizziness   ? Elevated blood pressure   ? Hypertension   ? ?Past Surgical History:  ?Procedure Laterality Date  ? CATARACT EXTRACTION W/ INTRAOCULAR LENS IMPLANT Right 11/05/2017  ? CATARACT EXTRACTION W/ INTRAOCULAR LENS IMPLANT Left  12/08/2017  ? ?Family History  ?Problem Relation Age of Onset  ? Hypertension Father   ? ?Social History  ? ?Tobacco Use  ? Smoking status: Never  ?  Passive exposure: Never  ? Smokeless tobacco: Never  ?Substance Use Topics  ? Alcohol use: No  ?  Alcohol/week: 0.0 standard drinks  ? ?No Known Allergies ?Prior to Admission medications   ?Medication Sig Start Date End Date Taking? Authorizing Provider  ?amiodarone (PACERONE) 200 MG tablet Take 1 tablet (200 mg total) by mouth daily. 02/09/22  Yes Hongalgi, Lenis Dickinson, MD  ?apixaban (ELIQUIS) 5 MG TABS tablet Take 1 tablet (5 mg total) by mouth 2 (two) times daily. 02/03/22  Yes Hongalgi, Lenis Dickinson, MD  ?finasteride (PROSCAR) 5 MG tablet TAKE 1 TABLET (5 MG TOTAL) BY MOUTH DAILY. 01/17/22  Yes Billey Co, MD  ?furosemide (LASIX) 20 MG tablet Take 1 tablet (20 mg total) by mouth daily. 01/30/22  Yes Copland, Frederico Hamman, MD  ?losartan (COZAAR) 25 MG tablet Take 1 tablet (25 mg total) by mouth daily. 02/04/22  Yes Hongalgi, Lenis Dickinson, MD  ?metFORMIN (GLUCOPHAGE) 500 MG tablet Take 1 tablet (500 mg total) by mouth daily with breakfast. 02/03/22 03/05/22 Yes Hongalgi, Lenis Dickinson, MD  ?metoprolol succinate (TOPROL-XL) 25 MG 24 hr tablet Take 0.5 tablets (12.5 mg total) by mouth daily. 02/04/22  Yes Hongalgi, Lenis Dickinson, MD  ?Omega-3 Fatty Acids (FISH OIL) 1000 MG CAPS Take 1,000 mg by mouth daily.   Yes [provider]  ?rosuvastatin (  CRESTOR) 20 MG tablet Take 1 tablet (20 mg total) by mouth daily. For cholesterol. Have patient call the office! 11/29/21  Yes Pleas Koch, NP  ? ?Review of Systems  ?Constitutional:  Negative for appetite change and fatigue.  ?HENT:  Negative for congestion, postnasal drip and sore throat.   ?Eyes: Negative.   ?Respiratory:  Positive for shortness of breath (minimal and improving). Negative for cough and chest tightness.   ?Cardiovascular:  Negative for chest pain, palpitations and leg swelling.  ?Gastrointestinal:  Negative for abdominal  distention and abdominal pain.  ?Endocrine: Negative.   ?Genitourinary: Negative.   ?Musculoskeletal:  Negative for back pain and neck pain.  ?Skin: Negative.   ?Allergic/Immunologic: Negative.   ?Neurological:  Negative for dizziness and light-headedness.  ?Hematological:  Negative for adenopathy. Bruises/bleeds easily.  ?Psychiatric/Behavioral:  Positive for sleep disturbance (sleeping on 1 pillow; difficulty sleeping at times). Negative for dysphoric mood. The patient is not nervous/anxious.   ? ?Vitals:  ? 02/13/22 0858  ?BP: 121/65  ?Pulse: 65  ?Resp: 20  ?SpO2: 97%  ?Weight: 292 lb (132.5 kg)  ?Height: 6' (1.829 m)  ? ?Wt Readings from Last 3 Encounters:  ?02/13/22 292 lb (132.5 kg)  ?02/03/22 295 lb 6.4 oz (134 kg)  ?01/30/22 (!) 309 lb (140.2 kg)  ? ?Lab Results  ?Component Value Date  ? CREATININE 1.09 02/03/2022  ? CREATININE 1.46 (H) 02/02/2022  ? CREATININE 1.52 (H) 02/01/2022  ? ?Physical Exam ?Vitals and nursing note reviewed. Exam conducted with a chaperone present (daughter).  ?Constitutional:   ?   Appearance: Normal appearance.  ?HENT:  ?   Head: Normocephalic and atraumatic.  ?Cardiovascular:  ?   Rate and Rhythm: Normal rate and regular rhythm.  ?Pulmonary:  ?   Effort: Pulmonary effort is normal. No respiratory distress.  ?   Breath sounds: No wheezing or rales.  ?Abdominal:  ?   General: There is no distension.  ?   Palpations: Abdomen is soft.  ?Musculoskeletal:     ?   General: No tenderness.  ?   Cervical back: Normal range of motion and neck supple.  ?   Right lower leg: Edema (trace pitting) present.  ?   Left lower leg: No edema.  ?Skin: ?   General: Skin is warm and dry.  ?Neurological:  ?   General: No focal deficit present.  ?   Mental Status: He is alert and oriented to person, place, and time.  ?Psychiatric:     ?   Mood and Affect: Mood normal.     ?   Behavior: Behavior normal.     ?   Thought Content: Thought content normal.  ? ? ?Assessment & Plan: ? ?1: Chronic heart failure  with reduced ejection fraction- ?- NYHA class II ?- euvolemic today ?- weighing daily and understands to call for an overnight weight gain of > 2 pounds or a weekly weight gain of > 5 pounds ?- not adding salt and has been reading food labels for sodium content; reviewed the importance of keeping sodium intake to < 2000mg  / day; no longer eating potato chips, vienna sausages etc ?- unsure of daily fluid intake; reviewed keeping it to 60-64 ounces/ day ?- on GDMT of losartan and metoprolol ?- reviewed the goal to change losartan to entresto, add SGLT2 and spironolactone if possible; plan to change his losartan to entresto at next visit ?- BNP 01/31/22 was 107.8 ? ?2: HTN- ?- BP looks good (121/65) ?-  saw PCP Carlis Abbott) 11/26/21; returns 02/18/22 ?- BMP 02/03/22 reviewed and showed sodium 137, potassium 4.4, creatinine 1.09 and GFR >60 ? ?3: DM- ?- A1c 01/30/22 was 7.8% ? ?4: Atrial fibrillation- ?- does not know about cardiology Corky Sox) f/u; called his office and it is scheduled for 02/20/22 ?- on amiodarone 200mg  daily and apixaban BID ? ? ?Medication list reviewed.  ? ?Return in 6 weeks, sooner if needed.  ? ?

## 2022-02-13 ENCOUNTER — Ambulatory Visit: Payer: Medicare HMO | Attending: Family | Admitting: Family

## 2022-02-13 ENCOUNTER — Encounter: Payer: Self-pay | Admitting: Family

## 2022-02-13 ENCOUNTER — Other Ambulatory Visit: Payer: Self-pay

## 2022-02-13 VITALS — BP 121/65 | HR 65 | Resp 20 | Ht 72.0 in | Wt 292.0 lb

## 2022-02-13 DIAGNOSIS — E119 Type 2 diabetes mellitus without complications: Secondary | ICD-10-CM | POA: Diagnosis not present

## 2022-02-13 DIAGNOSIS — Z79899 Other long term (current) drug therapy: Secondary | ICD-10-CM | POA: Insufficient documentation

## 2022-02-13 DIAGNOSIS — I110 Hypertensive heart disease with heart failure: Secondary | ICD-10-CM | POA: Insufficient documentation

## 2022-02-13 DIAGNOSIS — I4891 Unspecified atrial fibrillation: Secondary | ICD-10-CM | POA: Diagnosis not present

## 2022-02-13 DIAGNOSIS — N400 Enlarged prostate without lower urinary tract symptoms: Secondary | ICD-10-CM | POA: Insufficient documentation

## 2022-02-13 DIAGNOSIS — I10 Essential (primary) hypertension: Secondary | ICD-10-CM

## 2022-02-13 DIAGNOSIS — I480 Paroxysmal atrial fibrillation: Secondary | ICD-10-CM

## 2022-02-13 DIAGNOSIS — I5022 Chronic systolic (congestive) heart failure: Secondary | ICD-10-CM | POA: Diagnosis not present

## 2022-02-13 NOTE — Patient Instructions (Addendum)
Continue weighing daily and call for an overnight weight gain of 3 pounds or more or a weekly weight gain of more than 5 pounds. ? ? ?If you have voicemail, please make sure your mailbox is cleaned out so that we may leave a message and please make sure to listen to any voicemails.  ? ? ?[facility] ?[address] [place], [place] [address] ?Hospital Follow- up ?[phone] ?02/20/22 at 9am   ? ? ? ?Keep daily sodium intake to 2000mg  a day and drink about 60-64 ounces of fluid daily. ? ?

## 2022-02-18 ENCOUNTER — Ambulatory Visit (INDEPENDENT_AMBULATORY_CARE_PROVIDER_SITE_OTHER): Payer: Medicare HMO | Admitting: Primary Care

## 2022-02-18 ENCOUNTER — Other Ambulatory Visit: Payer: Self-pay

## 2022-02-18 ENCOUNTER — Encounter: Payer: Self-pay | Admitting: Primary Care

## 2022-02-18 ENCOUNTER — Other Ambulatory Visit: Payer: Self-pay | Admitting: Primary Care

## 2022-02-18 VITALS — BP 126/78 | HR 67 | Temp 98.6°F | Ht 72.0 in | Wt 296.0 lb

## 2022-02-18 DIAGNOSIS — N179 Acute kidney failure, unspecified: Secondary | ICD-10-CM

## 2022-02-18 DIAGNOSIS — I10 Essential (primary) hypertension: Secondary | ICD-10-CM

## 2022-02-18 DIAGNOSIS — E1165 Type 2 diabetes mellitus with hyperglycemia: Secondary | ICD-10-CM

## 2022-02-18 DIAGNOSIS — I4891 Unspecified atrial fibrillation: Secondary | ICD-10-CM | POA: Diagnosis not present

## 2022-02-18 DIAGNOSIS — E785 Hyperlipidemia, unspecified: Secondary | ICD-10-CM | POA: Diagnosis not present

## 2022-02-18 DIAGNOSIS — I5021 Acute systolic (congestive) heart failure: Secondary | ICD-10-CM

## 2022-02-18 LAB — BASIC METABOLIC PANEL
BUN: 27 mg/dL — ABNORMAL HIGH (ref 6–23)
CO2: 29 mEq/L (ref 19–32)
Calcium: 9.8 mg/dL (ref 8.4–10.5)
Chloride: 101 mEq/L (ref 96–112)
Creatinine, Ser: 1.34 mg/dL (ref 0.40–1.50)
GFR: 52.29 mL/min — ABNORMAL LOW (ref >60.00)
Glucose, Bld: 139 mg/dL — ABNORMAL HIGH (ref 70–99)
Potassium: 4.1 mEq/L (ref 3.5–5.1)
Sodium: 136 mEq/L (ref 135–145)

## 2022-02-18 LAB — LIPID PANEL
Cholesterol: 155 mg/dL (ref 0–200)
HDL: 30 mg/dL — ABNORMAL LOW (ref >39.00)
LDL Cholesterol: 97 mg/dL (ref 0–99)
NonHDL: 125.3
Total CHOL/HDL Ratio: 5
Triglycerides: 141 mg/dL (ref 0.0–149.0)
VLDL: 28.2 mg/dL (ref 0.0–40.0)

## 2022-02-18 MED ORDER — METFORMIN HCL ER 500 MG PO TB24: 500.0000 mg | ORAL_TABLET | Freq: Every day | ORAL | 0 refills | Status: DC

## 2022-02-18 NOTE — Assessment & Plan Note (Signed)
Recent admission for acute CHF, new onset. ? ?Appears to be doing much better today, appears euvolemic. ?Long discussion today regarding the importance of daily weights and when to report a weight gain, he is compliant to daily weights ? ?Continue furosemide 20 mg daily, metoprolol 12.5 mg daily. ?Hospital notes, labs, imaging reviewed.  ?

## 2022-02-18 NOTE — Assessment & Plan Note (Signed)
Controlled. ? ?Continue losartan 20 mg daily, metoprolol succinate 12.5 mg daily. ?

## 2022-02-18 NOTE — Assessment & Plan Note (Signed)
Seems like an isolated episode, rate and rhythm regular today. ? ?Reviewed hospital notes, labs, imaging. ? ?Continue amiodarone 200 mg daily, Eliquis 5 mg twice daily, metoprolol succinate 12.5 mg daily. ? ?Following with cardiology. ?

## 2022-02-18 NOTE — Assessment & Plan Note (Addendum)
Continue rosuvastatin 20 mg daily, repeat lipid panel pending ?

## 2022-02-18 NOTE — Assessment & Plan Note (Signed)
Renal function improved at discharge, repeat BMP pending today. ?

## 2022-02-18 NOTE — Progress Notes (Signed)
? ?Subjective:  ? ? Patient ID: Adam Jennings, male    DOB: 28-Aug-1946, 76 y.o.   MRN: [id number] ? ?HPI ? ?Adam Jennings is a very pleasant 76 y.o. male with a history of hypertension, atrial fibrillation with RVR, type 2 diabetes, AKI, elevated PSA, hyperlipidemia who presents today for hospital follow-up. ? ?He originally presented to our office, evaluated by Dr. Lorelei Pont on 01/30/2022 for new onset dyspnea and dyspnea upon exertion that began a few days prior.  Labs returned with elevated troponin, so it was advised he proceed to the emergency department. ? ?He presented to [facility] ED that same day, endorsed dyspnea on rest and exertion, extremely short winded with minimal movement.  He underwent CT a chest which was negative for PE, positive for small right and left trace pleural effusions with consolidation versus infiltrate.  Chest x-ray was concerning for edema.  BNP of 109, troponin of 43, creatinine of 0.83.  He was treated with IV Lasix and admitted for further evaluation. ? ?During his hospital stay he was diagnosed with acute CHF, Toprol XL 12.5 mg daily was initiated.  His A1c was noted to be 7.8, metformin 500 mg (regular dose) once daily was initiated.  He became tachycardic, EKG revealed atrial fibrillation with RVR, right bundle branch block, he was treated with diltiazem but this was ineffective so he was changed to amiodarone with improvement.  He underwent echocardiogram which revealed LVEF of 40 to 45%, LVH, left ventricular global hypokinesis, grade 1 diastolic dysfunction. ? ?Creatinine increased to 1.52 which was considered to be secondary to IV Lasix, lisinopril, IV contrast.  Renal function improved after discontinuation of lisinopril and Lasix.Cardiology consulted who recommended Lasix 20 mg orally daily, amiodarone 200 mg daily, losartan 25 mg daily, further ischemic work-up, IV Heparin with transition to Eliquis.  He was discharged home with recommendations for cardiology and PCP  follow-up. ? ?Since his discharge home he has been evaluated by the CHF clinic, saw Darylene Price, [place]. No changes made to his regimen, although there was discussion regarding changing losartan to Entresto and adding SGLT 2 and spironolactone at next visit. ? ?Today he endorses compliance to his mediation regimen, including new medications initiated in the hospital. He's weighing himself daily, denies weight gain of >2 pounds overnight and >5 pounds over the week. His dyspnea has significantly improved. He denies chest pain; diarrhea, GI upset from metformin. He has an appointment with the cardiologist later this week. ? ?BP Readings from Last 3 Encounters:  ?02/18/22 126/78  ?02/13/22 121/65  ?02/03/22 136/76  ? ?Wt Readings from Last 3 Encounters:  ?02/18/22 296 lb (134.3 kg)  ?02/13/22 292 lb (132.5 kg)  ?02/03/22 295 lb 6.4 oz (134 kg)  ? ? ? ?Review of Systems  ?Constitutional:  Negative for fatigue.  ?Respiratory:    ?     Dyspnea improved.  ?Cardiovascular:  Negative for chest pain and palpitations.  ?Neurological:  Negative for dizziness and headaches.  ? ?   ? ? ?Past Medical History:  ?Diagnosis Date  ? Arrhythmia   ? atrial fibrillation  ? BPH (benign prostatic hyperplasia)   ? CHF (congestive heart failure) ([place])   ? Diabetes mellitus without complication ([place])   ? Dizziness   ? Elevated blood pressure   ? Hypertension   ? ? ?Social History  ? ?Socioeconomic History  ? Marital status: Married  ?  Spouse name: Not on file  ? Number of children: Not on file  ? Years  of education: Not on file  ? Highest education level: Not on file  ?Occupational History  ? Not on file  ?Tobacco Use  ? Smoking status: Never  ?  Passive exposure: Never  ? Smokeless tobacco: Never  ?Vaping Use  ? Vaping Use: Never used  ?Substance and Sexual Activity  ? Alcohol use: No  ?  Alcohol/week: 0.0 standard drinks  ? Drug use: No  ? Sexual activity: Not Currently  ?Other Topics Concern  ? Not on file  ?Social History Narrative  ?  Married.  ? 1 daughter, 4 grandchildren.  ? Works in [employer] in [place].  ? Enjoys working on tractors.   ? ?Social Determinants of Health  ? ?Financial Resource Strain: Not on file  ?Food Insecurity: Not on file  ?Transportation Needs: Not on file  ?Physical Activity: Not on file  ?Stress: Not on file  ?Social Connections: Not on file  ?Intimate Partner Violence: Not on file  ? ? ?Past Surgical History:  ?Procedure Laterality Date  ? CATARACT EXTRACTION W/ INTRAOCULAR LENS IMPLANT Right 11/05/2017  ? CATARACT EXTRACTION W/ INTRAOCULAR LENS IMPLANT Left 12/08/2017  ? ? ?Family History  ?Problem Relation Age of Onset  ? Hypertension Father   ? ? ?No Known Allergies ? ?Current Outpatient Medications on File Prior to Visit  ?Medication Sig Dispense Refill  ? amiodarone (PACERONE) 200 MG tablet Take 1 tablet (200 mg total) by mouth daily. 30 tablet 1  ? apixaban (ELIQUIS) 5 MG TABS tablet Take 1 tablet (5 mg total) by mouth 2 (two) times daily. 60 tablet 1  ? finasteride (PROSCAR) 5 MG tablet TAKE 1 TABLET (5 MG TOTAL) BY MOUTH DAILY. 90 tablet 3  ? furosemide (LASIX) 20 MG tablet Take 1 tablet (20 mg total) by mouth daily. 20 tablet 0  ? losartan (COZAAR) 25 MG tablet Take 1 tablet (25 mg total) by mouth daily. 30 tablet 1  ? metoprolol succinate (TOPROL-XL) 25 MG 24 hr tablet Take 0.5 tablets (12.5 mg total) by mouth daily. 30 tablet 0  ? Omega-3 Fatty Acids (FISH OIL) 1000 MG CAPS Take 1,000 mg by mouth daily.    ? rosuvastatin (CRESTOR) 20 MG tablet Take 1 tablet (20 mg total) by mouth daily. For cholesterol. Have patient call the office! 90 tablet 0  ? ?No current facility-administered medications on file prior to visit.  ? ? ?BP 126/78   Pulse 67   Temp 98.6 ?F (37 ?C) (Oral)   Ht 6' (1.829 m)   Wt 296 lb (134.3 kg)   SpO2 96%   BMI 40.14 kg/m?  ?Objective:  ? Physical Exam ?Cardiovascular:  ?   Rate and Rhythm: Normal rate and regular rhythm.  ?Pulmonary:  ?   Effort: Pulmonary effort is normal.  ?   Breath  sounds: Normal breath sounds. No wheezing or rales.  ?Musculoskeletal:  ?   Cervical back: Neck supple.  ?Skin: ?   General: Skin is warm and dry.  ?Neurological:  ?   Mental Status: He is alert and oriented to person, place, and time.  ? ? ? ? ? ?   ?Assessment & Plan:  ? ? ? ? ?This visit occurred during the SARS-CoV-2 public health emergency.  Safety protocols were in place, including screening questions prior to the visit, additional usage of staff PPE, and extensive cleaning of exam room while observing appropriate contact time as indicated for disinfecting solutions.  ?

## 2022-02-18 NOTE — Patient Instructions (Signed)
Stop by the lab prior to leaving today. I will notify you of your results once received.  ? ?For diabetes you are taking a medication called metformin: ? ?For your current metformin medication, take 1 tablet twice daily. ?For the new metformin ER medication, take 1 tablet once daily. ? ?Follow up with cardiology as scheduled.  ? ?It was a pleasure to see you today! ? ?

## 2022-02-18 NOTE — Assessment & Plan Note (Signed)
New diagnosis as of January 2023, unfortunately he never returned our calls or our letter regarding his new diagnosis. ? ?A1c reviewed from recent hospital stay.  He is on the incorrect dose of metformin, will change to metformin XR 500 mg daily. ? ?We will plan to see him back in 3 months for repeat A1c and follow-up.   ?

## 2022-02-20 DIAGNOSIS — I5020 Unspecified systolic (congestive) heart failure: Secondary | ICD-10-CM | POA: Diagnosis not present

## 2022-02-20 DIAGNOSIS — I10 Essential (primary) hypertension: Secondary | ICD-10-CM | POA: Diagnosis not present

## 2022-02-20 DIAGNOSIS — E119 Type 2 diabetes mellitus without complications: Secondary | ICD-10-CM | POA: Diagnosis not present

## 2022-02-20 DIAGNOSIS — I480 Paroxysmal atrial fibrillation: Secondary | ICD-10-CM | POA: Diagnosis not present

## 2022-02-24 ENCOUNTER — Other Ambulatory Visit: Payer: Self-pay | Admitting: Primary Care

## 2022-02-24 ENCOUNTER — Other Ambulatory Visit: Payer: Self-pay | Admitting: Family Medicine

## 2022-02-24 DIAGNOSIS — E1165 Type 2 diabetes mellitus with hyperglycemia: Secondary | ICD-10-CM

## 2022-03-06 DIAGNOSIS — I5020 Unspecified systolic (congestive) heart failure: Secondary | ICD-10-CM | POA: Diagnosis not present

## 2022-03-12 DIAGNOSIS — I5020 Unspecified systolic (congestive) heart failure: Secondary | ICD-10-CM | POA: Diagnosis not present

## 2022-03-20 ENCOUNTER — Other Ambulatory Visit (INDEPENDENT_AMBULATORY_CARE_PROVIDER_SITE_OTHER): Payer: Medicare HMO

## 2022-03-20 DIAGNOSIS — N179 Acute kidney failure, unspecified: Secondary | ICD-10-CM

## 2022-03-20 LAB — BASIC METABOLIC PANEL
BUN: 21 mg/dL (ref 6–23)
CO2: 30 mEq/L (ref 19–32)
Calcium: 9.5 mg/dL (ref 8.4–10.5)
Chloride: 102 mEq/L (ref 96–112)
Creatinine, Ser: 1.25 mg/dL (ref 0.40–1.50)
GFR: 56.81 mL/min — ABNORMAL LOW (ref >60.00)
Glucose, Bld: 128 mg/dL — ABNORMAL HIGH (ref 70–99)
Potassium: 4.5 mEq/L (ref 3.5–5.1)
Sodium: 138 mEq/L (ref 135–145)

## 2022-03-26 NOTE — Progress Notes (Signed)
? Patient ID: Adam Jennings, male    DOB: 1946/03/09, 76 y.o.   MRN: [id number] ? ?HPI ? ?Adam Jennings is a 76 y/o male with a history of atrial fibrillation, DM, HTN, BPH and chronic heart failure.  ? ?Echo report from 01/31/22 reviewed and showed an EF of 40-45% along with mild LVH and mild Adam. ? ?Admitted 01/30/22 due to 1 week history of progressive exertional dyspnea, orthopnea (has been sleeping in the chair) and leg edema without chest pain. Elevated d-dimer and troponin. Cardiology consult obtained due to new onset HF/ AF. Initially given IV lasix with transition to oral diuretics. Chest CTA negative for PE. Started on amiodarone and heparin drip and then transition to oral medications. Elevated troponin thought to be due to demand ischemia. Lisinopril stopped due to AKI but then losartan was able to be started. Discharged after 4 days.  ? ?Adam Jennings presents today for a follow-up visit with a chief complaint of minimal shortness of breath upon moderate exertion. Adam Jennings describes this as chronic in nature. Adam Jennings has associated bruising, dizziness and chronic difficulty sleeping along with this. Adam Jennings denies any abdominal distention, palpitations, pedal edema, chest pain, cough, fatigue or weight gain.  ? ?Weight at home ranges from 283-288 pounds over the course of a week or so. Denies any overnight weight gains of > 2 pounds. Has been placed on entresto since last here and is tolerating it without any known side effects.  ? ?Does not check his glucose at home. Ate cereal, Special K and a banana for breakfast ~ 2 hours ago.  ? ?Past Medical History:  ?Diagnosis Date  ? Arrhythmia   ? atrial fibrillation  ? BPH (benign prostatic hyperplasia)   ? CHF (congestive heart failure) (HCC)   ? Diabetes mellitus without complication (HCC)   ? Dizziness   ? Elevated blood pressure   ? Hypertension   ? ?Past Surgical History:  ?Procedure Laterality Date  ? CATARACT EXTRACTION W/ INTRAOCULAR LENS IMPLANT Right 11/05/2017  ? CATARACT EXTRACTION  W/ INTRAOCULAR LENS IMPLANT Left 12/08/2017  ? ?Family History  ?Problem Relation Age of Onset  ? Hypertension Father   ? ?Social History  ? ?Tobacco Use  ? Smoking status: Never  ?  Passive exposure: Never  ? Smokeless tobacco: Never  ?Substance Use Topics  ? Alcohol use: No  ?  Alcohol/week: 0.0 standard drinks  ? ?No Known Allergies ? ?Past Medical History:  ?Diagnosis Date  ? Arrhythmia   ? atrial fibrillation  ? BPH (benign prostatic hyperplasia)   ? CHF (congestive heart failure) (HCC)   ? Diabetes mellitus without complication (HCC)   ? Dizziness   ? Elevated blood pressure   ? Hypertension   ? ?Past Surgical History:  ?Procedure Laterality Date  ? CATARACT EXTRACTION W/ INTRAOCULAR LENS IMPLANT Right 11/05/2017  ? CATARACT EXTRACTION W/ INTRAOCULAR LENS IMPLANT Left 12/08/2017  ? ?Family History  ?Problem Relation Age of Onset  ? Hypertension Father   ? ?Social History  ? ?Tobacco Use  ? Smoking status: Never  ?  Passive exposure: Never  ? Smokeless tobacco: Never  ?Substance Use Topics  ? Alcohol use: No  ?  Alcohol/week: 0.0 standard drinks  ? ?No Known Allergies ?Prior to Admission medications   ?Medication Sig Start Date End Date Taking? Authorizing Provider  ?amiodarone (PACERONE) 200 MG tablet Take 1 tablet (200 mg total) by mouth daily. 02/09/22  Yes Hongalgi, Maximino Greenland, MD  ?apixaban (ELIQUIS) 5 MG TABS tablet Take  1 tablet (5 mg total) by mouth 2 (two) times daily. 02/03/22  Yes Hongalgi, Maximino Greenland, MD  ?finasteride (PROSCAR) 5 MG tablet TAKE 1 TABLET (5 MG TOTAL) BY MOUTH DAILY. 01/17/22  Yes Sondra Come, MD  ?furosemide (LASIX) 20 MG tablet TAKE 1 TABLET BY MOUTH EVERY DAY 02/24/22  Yes Delma Freeze, FNP  ?metFORMIN (GLUCOPHAGE-XR) 500 MG 24 hr tablet Take 1 tablet (500 mg total) by mouth daily with breakfast. for diabetes. 02/18/22  Yes Doreene Nest, NP  ?metoprolol succinate (TOPROL-XL) 25 MG 24 hr tablet Take 0.5 tablets (12.5 mg total) by mouth daily. 02/04/22  Yes Hongalgi, Maximino Greenland, MD   ?Omega-3 Fatty Acids (FISH OIL) 1000 MG CAPS Take 1,000 mg by mouth daily.   Yes [provider]  ?rosuvastatin (CRESTOR) 20 MG tablet Take 1 tablet (20 mg total) by mouth daily. For cholesterol. Have patient call the office! 11/29/21  Yes Doreene Nest, NP  ?sacubitril-valsartan (ENTRESTO) 24-26 MG Take 1 tablet by mouth 2 (two) times daily.   Yes [provider]  ? ?Review of Systems  ?Constitutional:  Negative for appetite change and fatigue.  ?HENT:  Negative for congestion, postnasal drip and sore throat.   ?Eyes: Negative.   ?Respiratory:  Positive for shortness of breath (minimal). Negative for cough and chest tightness.   ?Cardiovascular:  Negative for chest pain, palpitations and leg swelling.  ?Gastrointestinal:  Negative for abdominal distention and abdominal pain.  ?Endocrine: Negative.   ?Genitourinary: Negative.   ?Musculoskeletal:  Negative for back pain and neck pain.  ?Skin: Negative.   ?Allergic/Immunologic: Negative.   ?Neurological:  Positive for dizziness. Negative for light-headedness.  ?Hematological:  Negative for adenopathy. Bruises/bleeds easily.  ?Psychiatric/Behavioral:  Positive for sleep disturbance (sleeping on 1 pillow; difficulty sleeping at times). Negative for dysphoric mood. The patient is not nervous/anxious.   ? ?Vitals:  ? 03/27/22 0833  ?BP: 131/60  ?Pulse: (!) 58  ?Resp: 18  ?SpO2: 96%  ?Weight: 293 lb 4 oz (133 kg)  ?Height: 6' (1.829 m)  ? ?Wt Readings from Last 3 Encounters:  ?03/27/22 293 lb 4 oz (133 kg)  ?02/18/22 296 lb (134.3 kg)  ?02/13/22 292 lb (132.5 kg)  ? ?Lab Results  ?Component Value Date  ? CREATININE 1.25 03/20/2022  ? CREATININE 1.34 02/18/2022  ? CREATININE 1.09 02/03/2022  ? ? ?Physical Exam ?Vitals and nursing note reviewed. Exam conducted with a chaperone present (daughter).  ?Constitutional:   ?   Appearance: Normal appearance.  ?HENT:  ?   Head: Normocephalic and atraumatic.  ?Cardiovascular:  ?   Rate and Rhythm: Regular rhythm.  Bradycardia present.  ?Pulmonary:  ?   Effort: Pulmonary effort is normal. No respiratory distress.  ?   Breath sounds: No wheezing or rales.  ?Abdominal:  ?   General: There is no distension.  ?   Palpations: Abdomen is soft.  ?Musculoskeletal:     ?   General: No tenderness.  ?   Cervical back: Normal range of motion and neck supple.  ?   Right lower leg: Edema (trace pitting) present.  ?   Left lower leg: No edema.  ?Skin: ?   General: Skin is warm and dry.  ?Neurological:  ?   General: No focal deficit present.  ?   Mental Status: Adam Jennings is alert and oriented to person, place, and time.  ?Psychiatric:     ?   Mood and Affect: Mood normal.     ?  Behavior: Behavior normal.     ?   Thought Content: Thought content normal.  ? ? ?Assessment & Plan: ? ?1: Chronic heart failure with reduced ejection fraction- ?- NYHA class II ?- euvolemic today ?- weighing daily and home weight ranges from 283-288 pounds; reminded to call for an overnight weight gain of > 2 pounds or a weekly weight gain of > 5 pounds ?- weight up 1.2 pounds from last visit here 6 weeks ago ?- not adding salt and has been reading food labels for sodium content; reviewed the importance of keeping sodium intake to < 2000mg  / day ?- unsure of daily fluid intake; reviewed keeping it to 60-64 ounces/ day ?- on GDMT of entresto and metoprolol ?- will add farxiga 10mg  daily; emphasized to make sure Adam Jennings wipes dry after urinating ?- once Adam Jennings starts this, Adam Jennings can use his furosemide PRN for above weight gain, edema, SOB ?- check BMP at next visit if not done elsewhere ?- BNP 01/31/22 was 107.8 ? ?2: HTN- ?- BP mildly elevated (131/60) ?- saw PCP Chestine Spore([facility]) 02/18/22 ?- BMP 03/20/22 reviewed and showed sodium 138, potassium 4.5, creatinine 1.25 and GFR 56.81 ? ?3: DM- ?- A1c 01/30/22 was 7.8% ?- NF glucose in clinic today was 212; ate special K & banana for breakfast a few hours ago ?- encouraged to check his glucose at home a few times a week ? ?4: Atrial fibrillation- ?- saw  cardiology Beatrix Fetters([place]) 02/20/22 ?- on amiodarone 200mg  daily and apixaban BID ? ? ?Medication bottles reviewed.  ? ?Return in 1 month, sooner if needed.  ? ? ?

## 2022-03-27 ENCOUNTER — Ambulatory Visit: Payer: Medicare HMO | Attending: Family | Admitting: Family

## 2022-03-27 ENCOUNTER — Encounter: Payer: Self-pay | Admitting: Family

## 2022-03-27 VITALS — BP 131/60 | HR 58 | Resp 18 | Ht 72.0 in | Wt 293.2 lb

## 2022-03-27 DIAGNOSIS — N400 Enlarged prostate without lower urinary tract symptoms: Secondary | ICD-10-CM | POA: Diagnosis not present

## 2022-03-27 DIAGNOSIS — Z8249 Family history of ischemic heart disease and other diseases of the circulatory system: Secondary | ICD-10-CM | POA: Insufficient documentation

## 2022-03-27 DIAGNOSIS — Z7901 Long term (current) use of anticoagulants: Secondary | ICD-10-CM | POA: Diagnosis not present

## 2022-03-27 DIAGNOSIS — I10 Essential (primary) hypertension: Secondary | ICD-10-CM

## 2022-03-27 DIAGNOSIS — I480 Paroxysmal atrial fibrillation: Secondary | ICD-10-CM | POA: Diagnosis not present

## 2022-03-27 DIAGNOSIS — I110 Hypertensive heart disease with heart failure: Secondary | ICD-10-CM | POA: Insufficient documentation

## 2022-03-27 DIAGNOSIS — G479 Sleep disorder, unspecified: Secondary | ICD-10-CM | POA: Insufficient documentation

## 2022-03-27 DIAGNOSIS — R42 Dizziness and giddiness: Secondary | ICD-10-CM | POA: Diagnosis not present

## 2022-03-27 DIAGNOSIS — I4891 Unspecified atrial fibrillation: Secondary | ICD-10-CM | POA: Insufficient documentation

## 2022-03-27 DIAGNOSIS — Z79899 Other long term (current) drug therapy: Secondary | ICD-10-CM | POA: Insufficient documentation

## 2022-03-27 DIAGNOSIS — I5022 Chronic systolic (congestive) heart failure: Secondary | ICD-10-CM | POA: Diagnosis not present

## 2022-03-27 DIAGNOSIS — E119 Type 2 diabetes mellitus without complications: Secondary | ICD-10-CM | POA: Insufficient documentation

## 2022-03-27 DIAGNOSIS — Z7984 Long term (current) use of oral hypoglycemic drugs: Secondary | ICD-10-CM | POA: Diagnosis not present

## 2022-03-27 LAB — GLUCOSE, CAPILLARY: Glucose-Capillary: 212 mg/dL — ABNORMAL HIGH (ref 70–99)

## 2022-03-27 MED ORDER — DAPAGLIFLOZIN PROPANEDIOL 10 MG PO TABS: 10.0000 mg | ORAL_TABLET | Freq: Every day | ORAL | 5 refills | Status: AC

## 2022-03-27 NOTE — Patient Instructions (Addendum)
Continue weighing daily and call for an overnight weight gain of 3 pounds or more or a weekly weight gain of more than 5 pounds. ? ? ?If you have voicemail, please make sure your mailbox is cleaned out so that we may leave a message and please make sure to listen to any voicemails.  ? ? ?Start taking farxgia as 1 tablet every morning. You can then decrease your fluid pill to just if you need it.  ?

## 2022-03-31 ENCOUNTER — Telehealth: Payer: Self-pay | Admitting: Primary Care

## 2022-03-31 NOTE — Telephone Encounter (Signed)
Left message for patient to call back and schedule Medicare Annual Wellness Visit (AWV) to be completed by video or phone. ? ? ? ?Last AWV: 03/14/2019 ? ? ? ?Please schedule at anytime with  ?[facility]    ? ? ? ?45 minute appointment ? ? ? ?Any questions, please contact me at [phone] ?

## 2022-04-07 ENCOUNTER — Other Ambulatory Visit: Payer: Self-pay | Admitting: Primary Care

## 2022-04-07 DIAGNOSIS — I5020 Unspecified systolic (congestive) heart failure: Secondary | ICD-10-CM | POA: Diagnosis not present

## 2022-04-07 DIAGNOSIS — Z01812 Encounter for preprocedural laboratory examination: Secondary | ICD-10-CM | POA: Diagnosis not present

## 2022-04-07 DIAGNOSIS — I480 Paroxysmal atrial fibrillation: Secondary | ICD-10-CM | POA: Diagnosis not present

## 2022-04-07 DIAGNOSIS — I10 Essential (primary) hypertension: Secondary | ICD-10-CM | POA: Diagnosis not present

## 2022-04-07 DIAGNOSIS — E1165 Type 2 diabetes mellitus with hyperglycemia: Secondary | ICD-10-CM

## 2022-04-07 DIAGNOSIS — R9439 Abnormal result of other cardiovascular function study: Secondary | ICD-10-CM | POA: Diagnosis not present

## 2022-04-17 ENCOUNTER — Inpatient Hospital Stay: Payer: Medicare HMO

## 2022-04-17 ENCOUNTER — Ambulatory Visit: Payer: Medicare HMO

## 2022-04-17 ENCOUNTER — Inpatient Hospital Stay
Admission: AD | Admit: 2022-04-17 | Discharge: 2022-04-19 | DRG: 287 | Disposition: A | Discharge status: Home / Self Care | Payer: Medicare HMO | Source: Ambulatory Visit | Attending: Obstetrics and Gynecology | Admitting: Obstetrics and Gynecology

## 2022-04-17 ENCOUNTER — Encounter: Payer: Self-pay | Admitting: Cardiology

## 2022-04-17 ENCOUNTER — Other Ambulatory Visit: Payer: Self-pay

## 2022-04-17 DIAGNOSIS — Z7901 Long term (current) use of anticoagulants: Secondary | ICD-10-CM

## 2022-04-17 DIAGNOSIS — I110 Hypertensive heart disease with heart failure: Secondary | ICD-10-CM | POA: Diagnosis not present

## 2022-04-17 DIAGNOSIS — I5022 Chronic systolic (congestive) heart failure: Secondary | ICD-10-CM | POA: Diagnosis present

## 2022-04-17 DIAGNOSIS — M7989 Other specified soft tissue disorders: Secondary | ICD-10-CM

## 2022-04-17 DIAGNOSIS — R600 Localized edema: Secondary | ICD-10-CM | POA: Diagnosis not present

## 2022-04-17 DIAGNOSIS — I5023 Acute on chronic systolic (congestive) heart failure: Secondary | ICD-10-CM | POA: Diagnosis not present

## 2022-04-17 DIAGNOSIS — Z6838 Body mass index (BMI) 38.0-38.9, adult: Secondary | ICD-10-CM | POA: Diagnosis not present

## 2022-04-17 DIAGNOSIS — N400 Enlarged prostate without lower urinary tract symptoms: Secondary | ICD-10-CM | POA: Diagnosis not present

## 2022-04-17 DIAGNOSIS — R9439 Abnormal result of other cardiovascular function study: Secondary | ICD-10-CM

## 2022-04-17 DIAGNOSIS — Z7984 Long term (current) use of oral hypoglycemic drugs: Secondary | ICD-10-CM | POA: Diagnosis not present

## 2022-04-17 DIAGNOSIS — I25118 Atherosclerotic heart disease of native coronary artery with other forms of angina pectoris: Principal | ICD-10-CM | POA: Diagnosis present

## 2022-04-17 DIAGNOSIS — I208 Other forms of angina pectoris: Secondary | ICD-10-CM

## 2022-04-17 DIAGNOSIS — I10 Essential (primary) hypertension: Secondary | ICD-10-CM | POA: Diagnosis present

## 2022-04-17 DIAGNOSIS — J90 Pleural effusion, not elsewhere classified: Secondary | ICD-10-CM | POA: Diagnosis not present

## 2022-04-17 DIAGNOSIS — Z8249 Family history of ischemic heart disease and other diseases of the circulatory system: Secondary | ICD-10-CM

## 2022-04-17 DIAGNOSIS — E669 Obesity, unspecified: Secondary | ICD-10-CM | POA: Diagnosis not present

## 2022-04-17 DIAGNOSIS — I480 Paroxysmal atrial fibrillation: Secondary | ICD-10-CM | POA: Diagnosis present

## 2022-04-17 DIAGNOSIS — J9811 Atelectasis: Secondary | ICD-10-CM | POA: Diagnosis not present

## 2022-04-17 DIAGNOSIS — Z79899 Other long term (current) drug therapy: Secondary | ICD-10-CM | POA: Diagnosis not present

## 2022-04-17 DIAGNOSIS — E119 Type 2 diabetes mellitus without complications: Secondary | ICD-10-CM | POA: Diagnosis present

## 2022-04-17 DIAGNOSIS — R0602 Shortness of breath: Secondary | ICD-10-CM | POA: Diagnosis not present

## 2022-04-17 HISTORY — DX: Pleural effusion, not elsewhere classified: J90

## 2022-04-17 HISTORY — DX: Ventral hernia without obstruction or gangrene: K43.9

## 2022-04-17 LAB — COMPREHENSIVE METABOLIC PANEL
ALT: 19 U/L (ref 0–44)
AST: 22 U/L (ref 15–41)
Albumin: 3.9 g/dL (ref 3.5–5.0)
Alkaline Phosphatase: 51 U/L (ref 38–126)
Anion gap: 5 (ref 5–15)
BUN: 22 mg/dL (ref 8–23)
CO2: 26 mmol/L (ref 22–32)
Calcium: 8.9 mg/dL (ref 8.9–10.3)
Chloride: 104 mmol/L (ref 98–111)
Creatinine, Ser: 1.08 mg/dL (ref 0.61–1.24)
GFR, Estimated: >60 mL/min (ref >60)
Glucose, Bld: 143 mg/dL — ABNORMAL HIGH (ref 70–99)
Potassium: 4.2 mmol/L (ref 3.5–5.1)
Sodium: 135 mmol/L (ref 135–145)
Total Bilirubin: 1.1 mg/dL (ref 0.3–1.2)
Total Protein: 7.7 g/dL (ref 6.5–8.1)

## 2022-04-17 LAB — CBC
HCT: 40.7 % (ref 39.0–52.0)
Hemoglobin: 13.2 g/dL (ref 13.0–17.0)
MCH: 30.6 pg (ref 26.0–34.0)
MCHC: 32.4 g/dL (ref 30.0–36.0)
MCV: 94.4 fL (ref 80.0–100.0)
Platelets: 230 10*3/uL (ref 150–400)
RBC: 4.31 MIL/uL (ref 4.22–5.81)
RDW: 14.3 % (ref 11.5–15.5)
WBC: 12.3 10*3/uL — ABNORMAL HIGH (ref 4.0–10.5)
nRBC: 0 % (ref 0.0–0.2)

## 2022-04-17 LAB — BODY FLUID CELL COUNT WITH DIFFERENTIAL
Eos, Fluid: 6 %
Lymphs, Fluid: 57 %
Monocyte-Macrophage-Serous Fluid: 14 %
Neutrophil Count, Fluid: 23 %
Total Nucleated Cell Count, Fluid: 1356 cu mm

## 2022-04-17 LAB — GLUCOSE, PLEURAL OR PERITONEAL FLUID: Glucose, Fluid: 143 mg/dL

## 2022-04-17 LAB — BRAIN NATRIURETIC PEPTIDE: B Natriuretic Peptide: 69.2 pg/mL (ref 0.0–100.0)

## 2022-04-17 LAB — PROTEIN, PLEURAL OR PERITONEAL FLUID: Total protein, fluid: 5.5 g/dL

## 2022-04-17 LAB — GLUCOSE, CAPILLARY
Glucose-Capillary: 175 mg/dL — ABNORMAL HIGH (ref 70–99)
Glucose-Capillary: 132 mg/dL — ABNORMAL HIGH (ref 70–99)
Glucose-Capillary: 187 mg/dL — ABNORMAL HIGH (ref 70–99)

## 2022-04-17 LAB — LACTATE DEHYDROGENASE, PLEURAL OR PERITONEAL FLUID: LD, Fluid: 89 U/L — ABNORMAL HIGH (ref 3–23)

## 2022-04-17 MED ORDER — AMIODARONE HCL 200 MG PO TABS
200.0000 mg | ORAL_TABLET | Freq: Every day | ORAL | Status: DC
  Administered 2022-04-17 – 2022-04-19 (×3): 200 mg via ORAL
  Filled 2022-04-17 (×3): qty 1

## 2022-04-17 MED ORDER — FINASTERIDE 5 MG PO TABS
5.0000 mg | ORAL_TABLET | Freq: Every day | ORAL | Status: DC
Start: 2022-04-17 — End: 2022-04-19
  Administered 2022-04-17 – 2022-04-19 (×2): 5 mg via ORAL
  Filled 2022-04-17 (×2): qty 1

## 2022-04-17 MED ORDER — SODIUM CHLORIDE 0.9% FLUSH: 3.0000 mL | INTRAVENOUS | Status: DC | PRN

## 2022-04-17 MED ORDER — SODIUM CHLORIDE 0.9% FLUSH
3.0000 mL | Freq: Two times a day (BID) | INTRAVENOUS | Status: DC
  Administered 2022-04-18 – 2022-04-19 (×2): 3 mL via INTRAVENOUS

## 2022-04-17 MED ORDER — ONDANSETRON HCL 4 MG/2ML IJ SOLN
4.0000 mg | Freq: Four times a day (QID) | INTRAMUSCULAR | Status: DC | PRN
Start: 2022-04-17 — End: 2022-04-19

## 2022-04-17 MED ORDER — INSULIN ASPART 100 UNIT/ML IJ SOLN
0.0000 [IU] | Freq: Three times a day (TID) | INTRAMUSCULAR | Status: DC
  Administered 2022-04-17: 3 [IU] via SUBCUTANEOUS
  Administered 2022-04-17: 2 [IU] via SUBCUTANEOUS
  Administered 2022-04-18 (×2): 3 [IU] via SUBCUTANEOUS
  Administered 2022-04-19: 2 [IU] via SUBCUTANEOUS
  Filled 2022-04-17 (×5): qty 1

## 2022-04-17 MED ORDER — KETOROLAC TROMETHAMINE 15 MG/ML IJ SOLN: 15.0000 mg | Freq: Four times a day (QID) | INTRAMUSCULAR | Status: AC | PRN

## 2022-04-17 MED ORDER — SODIUM CHLORIDE 0.9 % IV SOLN
250.0000 mL | INTRAVENOUS | Status: DC | PRN
  Administered 2022-04-18 (×2): 250 mL via INTRAVENOUS

## 2022-04-17 MED ORDER — SODIUM CHLORIDE 0.9 % IV SOLN: INTRAVENOUS | Status: DC

## 2022-04-17 MED ORDER — METOPROLOL SUCCINATE ER 25 MG PO TB24
12.5000 mg | ORAL_TABLET | Freq: Every day | ORAL | Status: DC
  Administered 2022-04-17 – 2022-04-19 (×2): 12.5 mg via ORAL
  Filled 2022-04-17: qty 0.5
  Filled 2022-04-17: qty 1

## 2022-04-17 MED ORDER — SODIUM CHLORIDE 0.9% FLUSH
3.0000 mL | Freq: Two times a day (BID) | INTRAVENOUS | Status: DC
  Administered 2022-04-18: 3 mL via INTRAVENOUS

## 2022-04-17 MED ORDER — SODIUM CHLORIDE 0.9 % IV SOLN: 250.0000 mL | INTRAVENOUS | Status: DC | PRN

## 2022-04-17 MED ORDER — HEPARIN SODIUM (PORCINE) 5000 UNIT/ML IJ SOLN
5000.0000 [IU] | Freq: Three times a day (TID) | INTRAMUSCULAR | Status: DC
  Administered 2022-04-17 – 2022-04-18 (×2): 5000 [IU] via SUBCUTANEOUS
  Filled 2022-04-17 (×2): qty 1

## 2022-04-17 MED ORDER — INSULIN ASPART 100 UNIT/ML IJ SOLN
INTRAMUSCULAR | Status: AC
  Administered 2022-04-17: 3 [IU]
  Filled 2022-04-17: qty 1

## 2022-04-17 MED ORDER — SACUBITRIL-VALSARTAN 24-26 MG PO TABS
1.0000 | ORAL_TABLET | Freq: Two times a day (BID) | ORAL | Status: DC
  Administered 2022-04-17 – 2022-04-19 (×4): 1 via ORAL
  Filled 2022-04-17 (×4): qty 1

## 2022-04-17 MED ORDER — SODIUM CHLORIDE 0.9% FLUSH
3.0000 mL | Freq: Two times a day (BID) | INTRAVENOUS | Status: DC
  Administered 2022-04-17 – 2022-04-18 (×2): 3 mL via INTRAVENOUS

## 2022-04-17 MED ORDER — FUROSEMIDE 10 MG/ML IJ SOLN: 40.0000 mg | Freq: Once | INTRAMUSCULAR | Status: AC

## 2022-04-17 MED ORDER — INSULIN ASPART 100 UNIT/ML IJ SOLN
0.0000 [IU] | INTRAMUSCULAR | Status: DC
  Administered 2022-04-17: 2 [IU] via SUBCUTANEOUS

## 2022-04-17 MED ORDER — OMEGA-3-ACID ETHYL ESTERS 1 G PO CAPS
1000.0000 mg | ORAL_CAPSULE | Freq: Every day | ORAL | Status: DC
  Administered 2022-04-17 – 2022-04-19 (×3): 1000 mg via ORAL
  Filled 2022-04-17 (×3): qty 1

## 2022-04-17 MED ORDER — FUROSEMIDE 10 MG/ML IJ SOLN
INTRAMUSCULAR | Status: AC
  Administered 2022-04-17: 40 mg via INTRAVENOUS
  Filled 2022-04-17: qty 4

## 2022-04-17 MED ORDER — ONDANSETRON HCL 4 MG PO TABS: 4.0000 mg | ORAL_TABLET | Freq: Four times a day (QID) | ORAL | Status: DC | PRN

## 2022-04-17 MED ORDER — ROSUVASTATIN CALCIUM 10 MG PO TABS
20.0000 mg | ORAL_TABLET | Freq: Every day | ORAL | Status: DC
  Administered 2022-04-17 – 2022-04-19 (×3): 20 mg via ORAL
  Filled 2022-04-17 (×2): qty 2
  Filled 2022-04-17: qty 1

## 2022-04-17 MED ORDER — ASPIRIN 81 MG PO CHEW: 324.0000 mg | CHEWABLE_TABLET | ORAL | Status: DC

## 2022-04-17 NOTE — OR Nursing (Signed)
EKG shows plural effsion per Dr Beatrix Fetters. Prime MD Dr Joylene Igo arrived to admit patient. Cath on hold. Daughter Adam Jennings called and informed father to be admitted. Lasix 40 mg IV given

## 2022-04-17 NOTE — Procedures (Signed)
PROCEDURE SUMMARY:  Successful US guided diagnostic and therapeutic right thoracentesis. Yielded 1.3 L of hazy, dark yellow fluid. Pt tolerated procedure well. No immediate complications.  Specimen was sent for labs. CXR ordered.  EBL < 1 mL  Tyson Alias, AGNP 04/17/2022 10:58 AM

## 2022-04-17 NOTE — H&P (View-Only) (Signed)
[facility]  CARDIOLOGY CONSULT NOTE  Patient ID: Adam Jennings MRN: [id number] DOB/AGE: 06/20/1946 76 y.o.  Admit date: 04/17/2022 Referring Physician NA Primary Physician Clark, [name], NP Primary Cardiologist Arch Methot [name] Murielle Stang, MD Reason for Consultation Shortness of breath  HPI:  Adam Jennings is a 76-year-old male with a history of HFrEF (EF 40 to 45%), paroxysmal atrial fibrillation, essential hypertension, type 2 diabetes who presented today for scheduled outpatient heart catheterization.  He describes worsening shortness of breath and orthopnea for the last few days, as well as right-sided chest discomfort worse with taking deep breaths.  He was admitted to [facility] from 01/30/2022 until 02/03/2022 with progressive shortness of breath and lower extremity edema was discovered to have an EF of 40 to 45%.  During this admission he developed A-fib with RVR for which she was converted using IV amiodarone.  In follow-up he has done relatively well and was scheduled for outpatient heart catheterization today but on presentation and was quite short of breath and unable to lay flat.  He also describes right-sided chest discomfort since Monday that is worse with taking breaths and is sharp.  On arrival to the Cath Lab he is tachypneic to the mid 20s, saturating in the low 90s on room air.  His blood pressure is 144/73, and his heart rate is 65.  Chest x-ray was completed which shows a moderate right sided pleural effusion.  Review of systems complete and found to be negative unless listed above     Past Medical History:  Diagnosis Date   Arrhythmia    atrial fibrillation   BPH (benign prostatic hyperplasia)    CHF (congestive heart failure) (HCC)    Diabetes mellitus without complication (HCC)    Dizziness    Elevated blood pressure    Hernia of abdominal wall    Hypertension     Past Surgical History:  Procedure Laterality Date   CATARACT EXTRACTION W/ INTRAOCULAR LENS IMPLANT  Right 11/05/2017   CATARACT EXTRACTION W/ INTRAOCULAR LENS IMPLANT Left 12/08/2017    Medications Prior to Admission  Medication Sig Dispense Refill Last Dose   amiodarone (PACERONE) 200 MG tablet Take 1 tablet (200 mg total) by mouth daily. 30 tablet 1 04/16/2022   apixaban (ELIQUIS) 5 MG TABS tablet Take 1 tablet (5 mg total) by mouth 2 (two) times daily. 60 tablet 1 04/15/2022   dapagliflozin propanediol (FARXIGA) 10 MG TABS tablet Take 1 tablet (10 mg total) by mouth daily before breakfast. 30 tablet 5 04/16/2022   finasteride (PROSCAR) 5 MG tablet TAKE 1 TABLET (5 MG TOTAL) BY MOUTH DAILY. 90 tablet 3 04/16/2022   furosemide (LASIX) 20 MG tablet TAKE 1 TABLET BY MOUTH EVERY DAY 30 tablet 5 04/16/2022   metFORMIN (GLUCOPHAGE-XR) 500 MG 24 hr tablet TAKE 1 TABLET (500 MG TOTAL) BY MOUTH DAILY WITH BREAKFAST. FOR DIABETES. 90 tablet 0 04/16/2022   metoprolol succinate (TOPROL-XL) 25 MG 24 hr tablet Take 0.5 tablets (12.5 mg total) by mouth daily. 30 tablet 0 04/16/2022   Omega-3 Fatty Acids (FISH OIL) 1000 MG CAPS Take 1,000 mg by mouth daily.   04/16/2022   rosuvastatin (CRESTOR) 20 MG tablet Take 1 tablet (20 mg total) by mouth daily. For cholesterol. Have patient call the office! 90 tablet 0 04/16/2022   sacubitril-valsartan (ENTRESTO) 24-26 MG Take 1 tablet by mouth 2 (two) times daily.   04/16/2022   Social History   Socioeconomic History   Marital status: Married    Spouse name:   Not on file   Number of children: Not on file   Years of education: Not on file   Highest education level: Not on file  Occupational History   Not on file  Tobacco Use   Smoking status: Never    Passive exposure: Never   Smokeless tobacco: Never  Vaping Use   Vaping Use: Never used  Substance and Sexual Activity   Alcohol use: No    Alcohol/week: 0.0 standard drinks   Drug use: No   Sexual activity: Not Currently  Other Topics Concern   Not on file  Social History Narrative   Married.   1 daughter, 4  grandchildren.   Works in [employer].   Enjoys working on tractors.    Social Determinants of Health   Financial Resource Strain: Not on file  Food Insecurity: Not on file  Transportation Needs: Not on file  Physical Activity: Not on file  Stress: Not on file  Social Connections: Not on file  Intimate Partner Violence: Not on file    Family History  Problem Relation Age of Onset   Hypertension Father       Review of systems complete and found to be negative unless listed above      PHYSICAL EXAM  General: Well developed, well nourished, in no acute distress HEENT:  Normocephalic and atramatic Neck:  No JVD.  Lungs: Clear bilaterally to auscultation and percussion. Heart: HRRR . Normal S1 and S2 without gallops or murmurs.  Abdomen: Bowel sounds are positive, abdomen soft and non-tender  Msk:  Back normal, normal gait. Normal strength and tone for age. Extremities: No clubbing, cyanosis or edema.   Neuro: Alert and oriented X 3. Psych:  Good affect, responds appropriately  Labs:   Lab Results  Component Value Date   WBC 12.3 (H) 04/17/2022   HGB 13.2 04/17/2022   HCT 40.7 04/17/2022   MCV 94.4 04/17/2022   PLT 230 04/17/2022   No results for input(s): NA, K, CL, CO2, BUN, CREATININE, CALCIUM, PROT, BILITOT, ALKPHOS, ALT, AST, GLUCOSE in the last 168 hours.  Invalid input(s): LABALBU Lab Results  Component Value Date   TROPONINI <0.03 02/12/2016    Lab Results  Component Value Date   CHOL 155 02/18/2022   CHOL 177 11/26/2021   CHOL 172 10/23/2020   Lab Results  Component Value Date   HDL 30.00 (L) 02/18/2022   HDL 32.50 (L) 11/26/2021   HDL 39.80 10/23/2020   Lab Results  Component Value Date   LDLCALC 97 02/18/2022   LDLCALC 123 (H) 11/26/2021   LDLCALC 115 (H) 10/23/2020   Lab Results  Component Value Date   TRIG 141.0 02/18/2022   TRIG 105.0 11/26/2021   TRIG 86.0 10/23/2020   Lab Results  Component Value Date   CHOLHDL 5 02/18/2022    CHOLHDL 5 11/26/2021   CHOLHDL 4 10/23/2020   No results found for: LDLDIRECT    Radiology: No results found.  EKG: Sinus brady cardia.   ASSESSMENT AND PLAN:  Adam Jennings is a 76-year-old male with a history of HFrEF (EF 40 to 45%), paroxysmal atrial fibrillation, essential hypertension, type 2 diabetes who presented today for scheduled outpatient heart catheterization.  He describes worsening shortness of breath and orthopnea for the last few days, as well as right-sided chest discomfort worse with taking deep breaths.  #Acute on chronic heart failure (EF 40 to 45%) #Pleuritic right-sided chest pain #Right-sided pleural effusion He presents with 3 days of worsening   shortness of breath as well as orthopnea.  Additionally major complaint is right-sided chest discomfort worse with taking deep breaths.  He is unable to reliably lay flat for scheduled heart catheterization today, and thus needs admission for optimization and work-up of his right-sided pleural effusion.  He has mild lower extremity edema, BNP is pending. -We will admit to inpatient. -Lasix 40 mg IV ordered -Pending blood work showing no significant AKI, would recommend continuation of home medications including Entresto, and metoprolol -Consider thoracentesis for evaluation of right-sided pleural effusion -Been elevated white blood cell count, consider pneumonia as a possible etiology. -Pending further work-up and improvement in symptoms, we can plan for possible heart catheterization as early as tomorrow, versus rescheduling this for an outpatient.  #Paroxysmal atrial fibrillation Occurred in March 2023 in the setting of new onset heart failure.  He has been on amiodarone since that time. -Eliquis has been on hold since 04/15/2022 in anticipation of heart catheterization -Recommend initiation of heparin for atrial fibrillation while inpatient  Signed: Jhovanny Guinta [name] Sara Selvidge MD 04/17/2022, 8:26 AM  

## 2022-04-17 NOTE — Assessment & Plan Note (Signed)
Patient with complaints of right-sided pleuritic chest pain associated with shortness of breath and orthopnea. Chest x-ray shows a moderate right-sided pleural effusion We will schedule ultrasound-guided thoracentesis Follow-up results of fluid studies As needed Toradol for pain control

## 2022-04-17 NOTE — Assessment & Plan Note (Addendum)
Stable and not acutely exacerbated Continue metoprolol and Entresto Patient received 1 dose of Lasix 40 mg IV We will hold off on administering further diuretic therapy at this time since patient's weight is stable

## 2022-04-17 NOTE — OR Nursing (Signed)
Pt arrived in specials recovery. Complaining of right upper chest discomfort since Monday with increased SOB,

## 2022-04-17 NOTE — H&P (Signed)
History and Physical    Patient: Adam Jennings MVH:[id number] DOB: October 31, 1946 DOA: 04/17/2022 DOS: the patient was seen and examined on 04/17/2022 PCP: Doreene Nest, NP  Patient coming from: Home  Chief Complaint: No chief complaint on file.  HPI: Adam Jennings is a 76 y.o. male with medical history significant for chronic systolic heart failure (last known LVEF of 40 to 45%), history of paroxysmal A-fib, hypertension, diabetes mellitus who presented for scheduled outpatient heart catheterization but has complaints of right anterior chest wall pain which he has had for 3 days and has worsened since then.  He describes his pain as sharp and pleuritic in nature and rated about an 8 x 10 in intensity at its worst.  He has been unable to lay flat in bed due to difficulty breathing and has had to sleep in a recliner for several days.  He complains of shortness of breath which initially he had with exertion but now is short of breath at rest.  He has been off his diuretic for over a month and was advised to take for weight gain.  He states that his weight has been stable over the last several weeks. He denies having any fever or chills, no cough, no leg swelling, no dizziness, no lightheadedness, no headache, no blurred vision or focal deficit. He had a chest x-ray which showed a moderate right-sided pleural effusion.  He will be admitted to the hospital for further evaluation. Review of Systems: As mentioned in the history of present illness. All other systems reviewed and are negative. Past Medical History:  Diagnosis Date   Arrhythmia    atrial fibrillation   BPH (benign prostatic hyperplasia)    CHF (congestive heart failure) (HCC)    Diabetes mellitus without complication (HCC)    Dizziness    Elevated blood pressure    Hernia of abdominal wall    Hypertension    Past Surgical History:  Procedure Laterality Date   CATARACT EXTRACTION W/ INTRAOCULAR LENS IMPLANT Right 11/05/2017    CATARACT EXTRACTION W/ INTRAOCULAR LENS IMPLANT Left 12/08/2017   Social History:  reports that he has never smoked. He has never been exposed to tobacco smoke. He has never used smokeless tobacco. He reports that he does not drink alcohol and does not use drugs.  No Known Allergies  Family History  Problem Relation Age of Onset   Hypertension Father     Prior to Admission medications   Medication Sig Start Date End Date Taking? Authorizing Provider  amiodarone (PACERONE) 200 MG tablet Take 1 tablet (200 mg total) by mouth daily. 02/09/22  Yes Hongalgi, Maximino Greenland, MD  apixaban (ELIQUIS) 5 MG TABS tablet Take 1 tablet (5 mg total) by mouth 2 (two) times daily. 02/03/22  Yes Hongalgi, Maximino Greenland, MD  dapagliflozin propanediol (FARXIGA) 10 MG TABS tablet Take 1 tablet (10 mg total) by mouth daily before breakfast. 03/27/22  Yes Hackney, Tina A, FNP  finasteride (PROSCAR) 5 MG tablet TAKE 1 TABLET (5 MG TOTAL) BY MOUTH DAILY. 01/17/22  Yes Sondra Come, MD  furosemide (LASIX) 20 MG tablet TAKE 1 TABLET BY MOUTH EVERY DAY 02/24/22  Yes Clarisa Kindred A, FNP  metFORMIN (GLUCOPHAGE-XR) 500 MG 24 hr tablet TAKE 1 TABLET (500 MG TOTAL) BY MOUTH DAILY WITH BREAKFAST. FOR DIABETES. 04/08/22  Yes Doreene Nest, NP  metoprolol succinate (TOPROL-XL) 25 MG 24 hr tablet Take 0.5 tablets (12.5 mg total) by mouth daily. 02/04/22  Yes Hongalgi, Maximino Greenland,  MD  Omega-3 Fatty Acids (FISH OIL) 1000 MG CAPS Take 1,000 mg by mouth daily.   Yes [provider]  rosuvastatin (CRESTOR) 20 MG tablet Take 1 tablet (20 mg total) by mouth daily. For cholesterol. Have patient call the office! 11/29/21  Yes Doreene Nest, NP  sacubitril-valsartan (ENTRESTO) 24-26 MG Take 1 tablet by mouth 2 (two) times daily.   Yes [provider]    Physical Exam: Vitals:   04/17/22 0816  BP: (!) 144/73  Pulse: 65  Resp: (!) 26  Temp: 97.9 F (36.6 C)  TempSrc: Oral  SpO2: 93%   Physical Exam Constitutional:       Appearance: He is obese.  HENT:     Head: Normocephalic and atraumatic.     Nose: Nose normal.     Mouth/Throat:     Mouth: Mucous membranes are moist.  Eyes:     Conjunctiva/sclera: Conjunctivae normal.  Cardiovascular:     Rate and Rhythm: Normal rate and regular rhythm.  Pulmonary:     Breath sounds: Rales present.     Comments: Tachypneic.  Decreased air entry in the right lower lung field Abdominal:     General: Bowel sounds are normal.     Palpations: Abdomen is soft.     Comments: Central adiposity  Musculoskeletal:        General: Normal range of motion.     Cervical back: Normal range of motion and neck supple.     Comments: Right calf appears bigger than left calf  Skin:    General: Skin is warm and dry.  Neurological:     General: No focal deficit present.     Mental Status: He is alert and oriented to person, place, and time.  Psychiatric:        Mood and Affect: Mood normal.        Behavior: Behavior normal.    Data Reviewed: Relevant notes from primary care and specialist visits, past discharge summaries as available in EHR, including Care Everywhere. Prior diagnostic testing as pertinent to current admission diagnoses Updated medications and problem lists for reconciliation ED course, including vitals, labs, imaging, treatment and response to treatment Triage notes, nursing and pharmacy notes and ED provider's notes Notable results as noted in HPI Labs reviewed.  Sodium 135, potassium 4.2, chloride 104, bicarb 26, glucose 143, BUN 22, creatinine 1.08, calcium 8.9, BNP 69.2, white count 12.3, hemoglobin 13.2, hematocrit 40.7, platelet count 230 Chest x-ray shows new moderate right-sided pleural effusion  There are no new results to review at this time.  Assessment and Plan: * Pleural effusion on right Patient with complaints of right-sided pleuritic chest pain associated with shortness of breath and orthopnea. Chest x-ray shows a moderate right-sided  pleural effusion We will schedule ultrasound-guided thoracentesis Follow-up results of fluid studies As needed Toradol for pain control  Essential hypertension Blood pressure is stable Continue metoprolol  Paroxysmal atrial fibrillation (HCC) Continue metoprolol for rate control Apixaban is on hold for planned procedure  Diabetes mellitus (HCC) Hold metformin for planned procedure in a.m. Hold Jardiance  Glycemic control with sliding scale insulin Maintain consistent carbohydrate diet  Chronic systolic heart failure (HCC) Stable and not acutely exacerbated Continue metoprolol and Entresto Patient received 1 dose of Lasix 40 mg IV We will hold off on administering further diuretic therapy at this time since patient's weight is stable      Advance Care Planning:   Code Status: Full Code   Consults: Cardiology  Family Communication: Greater than 50% of time was spent discussing patient's condition and plan of care with him at the bedside.  All questions and concerns have been addressed.  He verbalizes understanding and agrees with the plan.  Severity of Illness: The appropriate patient status for this patient is INPATIENT. Inpatient status is judged to be reasonable and necessary in order to provide the required intensity of service to ensure the patient's safety. The patient's presenting symptoms, physical exam findings, and initial radiographic and laboratory data in the context of their chronic comorbidities is felt to place them at high risk for further clinical deterioration. Furthermore, it is not anticipated that the patient will be medically stable for discharge from the hospital within 2 midnights of admission.   * I certify that at the point of admission it is my clinical judgment that the patient will require inpatient hospital care spanning beyond 2 midnights from the point of admission due to high intensity of service, high risk for further deterioration and high  frequency of surveillance required.*  Author: Lucile Shuttersochukwu Quilla Freeze, MD 04/17/2022 9:23 AM  For on call review [email or web address].

## 2022-04-17 NOTE — Consult Note (Signed)
[facility] Cardiology  CARDIOLOGY CONSULT NOTE  Patient ID: Adam Jennings MRN: [id number] DOB/AGE: Aug 18, 1946 76 y.o.  Admit date: 04/17/2022 Referring Physician NA Primary Physician Pleas Koch, NP Primary Cardiologist Andrez Grime, MD Reason for Consultation Shortness of breath  HPI:  Adam Jennings is a 76 year old male with a history of HFrEF (EF 40 to 45%), paroxysmal atrial fibrillation, essential hypertension, type 2 diabetes who presented today for scheduled outpatient heart catheterization.  He describes worsening shortness of breath and orthopnea for the last few days, as well as right-sided chest discomfort worse with taking deep breaths.  He was admitted to [facility] from 01/30/2022 until 02/03/2022 with progressive shortness of breath and lower extremity edema was discovered to have an EF of 40 to 45%.  During this admission he developed A-fib with RVR for which she was converted using IV amiodarone.  In follow-up he has done relatively well and was scheduled for outpatient heart catheterization today but on presentation and was quite short of breath and unable to lay flat.  He also describes right-sided chest discomfort since Monday that is worse with taking breaths and is sharp.  On arrival to the Cath Lab he is tachypneic to the mid 20s, saturating in the low 90s on room air.  His blood pressure is 144/73, and his heart rate is 65.  Chest x-ray was completed which shows a moderate right sided pleural effusion.  Review of systems complete and found to be negative unless listed above     Past Medical History:  Diagnosis Date   Arrhythmia    atrial fibrillation   BPH (benign prostatic hyperplasia)    CHF (congestive heart failure) (HCC)    Diabetes mellitus without complication (HCC)    Dizziness    Elevated blood pressure    Hernia of abdominal wall    Hypertension     Past Surgical History:  Procedure Laterality Date   CATARACT EXTRACTION W/ INTRAOCULAR LENS IMPLANT  Right 11/05/2017   CATARACT EXTRACTION W/ INTRAOCULAR LENS IMPLANT Left 12/08/2017    Medications Prior to Admission  Medication Sig Dispense Refill Last Dose   amiodarone (PACERONE) 200 MG tablet Take 1 tablet (200 mg total) by mouth daily. 30 tablet 1 04/16/2022   apixaban (ELIQUIS) 5 MG TABS tablet Take 1 tablet (5 mg total) by mouth 2 (two) times daily. 60 tablet 1 04/15/2022   dapagliflozin propanediol (FARXIGA) 10 MG TABS tablet Take 1 tablet (10 mg total) by mouth daily before breakfast. 30 tablet 5 04/16/2022   finasteride (PROSCAR) 5 MG tablet TAKE 1 TABLET (5 MG TOTAL) BY MOUTH DAILY. 90 tablet 3 04/16/2022   furosemide (LASIX) 20 MG tablet TAKE 1 TABLET BY MOUTH EVERY DAY 30 tablet 5 04/16/2022   metFORMIN (GLUCOPHAGE-XR) 500 MG 24 hr tablet TAKE 1 TABLET (500 MG TOTAL) BY MOUTH DAILY WITH BREAKFAST. FOR DIABETES. 90 tablet 0 04/16/2022   metoprolol succinate (TOPROL-XL) 25 MG 24 hr tablet Take 0.5 tablets (12.5 mg total) by mouth daily. 30 tablet 0 04/16/2022   Omega-3 Fatty Acids (FISH OIL) 1000 MG CAPS Take 1,000 mg by mouth daily.   04/16/2022   rosuvastatin (CRESTOR) 20 MG tablet Take 1 tablet (20 mg total) by mouth daily. For cholesterol. Have patient call the office! 90 tablet 0 04/16/2022   sacubitril-valsartan (ENTRESTO) 24-26 MG Take 1 tablet by mouth 2 (two) times daily.   04/16/2022   Social History   Socioeconomic History   Marital status: Married    Spouse name:  Not on file   Number of children: Not on file   Years of education: Not on file   Highest education level: Not on file  Occupational History   Not on file  Tobacco Use   Smoking status: Never    Passive exposure: Never   Smokeless tobacco: Never  Vaping Use   Vaping Use: Never used  Substance and Sexual Activity   Alcohol use: No    Alcohol/week: 0.0 standard drinks   Drug use: No   Sexual activity: Not Currently  Other Topics Concern   Not on file  Social History Narrative   Married.   1 daughter, 4  grandchildren.   Works in [employer] in [place].   Enjoys working on tractors.    Social Determinants of Health   Financial Resource Strain: Not on file  Food Insecurity: Not on file  Transportation Needs: Not on file  Physical Activity: Not on file  Stress: Not on file  Social Connections: Not on file  Intimate Partner Violence: Not on file    Family History  Problem Relation Age of Onset   Hypertension Father       Review of systems complete and found to be negative unless listed above      PHYSICAL EXAM  General: Well developed, well nourished, in no acute distress HEENT:  Normocephalic and atramatic Neck:  No JVD.  Lungs: Clear bilaterally to auscultation and percussion. Heart: HRRR . Normal S1 and S2 without gallops or murmurs.  Abdomen: Bowel sounds are positive, abdomen soft and non-tender  Msk:  Back normal, normal gait. Normal strength and tone for age. Extremities: No clubbing, cyanosis or edema.   Neuro: Alert and oriented X 3. Psych:  Good affect, responds appropriately  Labs:   Lab Results  Component Value Date   WBC 12.3 (H) 04/17/2022   HGB 13.2 04/17/2022   HCT 40.7 04/17/2022   MCV 94.4 04/17/2022   PLT 230 04/17/2022   No results for input(s): NA, K, CL, CO2, BUN, CREATININE, CALCIUM, PROT, BILITOT, ALKPHOS, ALT, AST, GLUCOSE in the last 168 hours.  Invalid input(s): LABALBU Lab Results  Component Value Date   TROPONINI <0.03 02/12/2016    Lab Results  Component Value Date   CHOL 155 02/18/2022   CHOL 177 11/26/2021   CHOL 172 10/23/2020   Lab Results  Component Value Date   HDL 30.00 (L) 02/18/2022   HDL 32.50 (L) 11/26/2021   HDL 39.80 10/23/2020   Lab Results  Component Value Date   LDLCALC 97 02/18/2022   LDLCALC 123 (H) 11/26/2021   LDLCALC 115 (H) 10/23/2020   Lab Results  Component Value Date   TRIG 141.0 02/18/2022   TRIG 105.0 11/26/2021   TRIG 86.0 10/23/2020   Lab Results  Component Value Date   CHOLHDL 5 02/18/2022    CHOLHDL 5 11/26/2021   CHOLHDL 4 10/23/2020   No results found for: LDLDIRECT    Radiology: No results found.  EKG: Sinus brady cardia.   ASSESSMENT AND PLAN:  Adam Jennings is a 76 year old male with a history of HFrEF (EF 40 to 45%), paroxysmal atrial fibrillation, essential hypertension, type 2 diabetes who presented today for scheduled outpatient heart catheterization.  He describes worsening shortness of breath and orthopnea for the last few days, as well as right-sided chest discomfort worse with taking deep breaths.  #Acute on chronic heart failure (EF 40 to 45%) #Pleuritic right-sided chest pain #Right-sided pleural effusion He presents with 3 days of worsening  shortness of breath as well as orthopnea.  Additionally major complaint is right-sided chest discomfort worse with taking deep breaths.  He is unable to reliably lay flat for scheduled heart catheterization today, and thus needs admission for optimization and work-up of his right-sided pleural effusion.  He has mild lower extremity edema, BNP is pending. -We will admit to inpatient. -Lasix 40 mg IV ordered -Pending blood work showing no significant AKI, would recommend continuation of home medications including Entresto, and metoprolol -Consider thoracentesis for evaluation of right-sided pleural effusion -Been elevated white blood cell count, consider pneumonia as a possible etiology. -Pending further work-up and improvement in symptoms, we can plan for possible heart catheterization as early as tomorrow, versus rescheduling this for an outpatient.  #Paroxysmal atrial fibrillation Occurred in March 2023 in the setting of new onset heart failure.  He has been on amiodarone since that time. -Eliquis has been on hold since 04/15/2022 in anticipation of heart catheterization -Recommend initiation of heparin for atrial fibrillation while inpatient  Signed: Andrez Grime MD 04/17/2022, 8:26 AM

## 2022-04-17 NOTE — Assessment & Plan Note (Signed)
Blood pressure is stable Continue metoprolol 

## 2022-04-17 NOTE — Assessment & Plan Note (Signed)
Hold metformin for planned procedure in a.m. Hold Jardiance  Glycemic control with sliding scale insulin Maintain consistent carbohydrate diet

## 2022-04-17 NOTE — Assessment & Plan Note (Signed)
Continue metoprolol for rate control Apixaban is on hold for planned procedure

## 2022-04-18 ENCOUNTER — Encounter: Payer: Self-pay | Admitting: Cardiology

## 2022-04-18 ENCOUNTER — Encounter
Admission: AD | Disposition: A | Discharge status: Home / Self Care | Payer: Self-pay | Source: Ambulatory Visit | Attending: Obstetrics and Gynecology

## 2022-04-18 DIAGNOSIS — J90 Pleural effusion, not elsewhere classified: Secondary | ICD-10-CM | POA: Diagnosis not present

## 2022-04-18 HISTORY — PX: LEFT HEART CATH AND CORONARY ANGIOGRAPHY: CATH118249

## 2022-04-18 LAB — BASIC METABOLIC PANEL
Anion gap: 10 (ref 5–15)
BUN: 35 mg/dL — ABNORMAL HIGH (ref 8–23)
CO2: 25 mmol/L (ref 22–32)
Calcium: 8.8 mg/dL — ABNORMAL LOW (ref 8.9–10.3)
Chloride: 103 mmol/L (ref 98–111)
Creatinine, Ser: 1.25 mg/dL — ABNORMAL HIGH (ref 0.61–1.24)
GFR, Estimated: >60 mL/min (ref >60)
Glucose, Bld: 140 mg/dL — ABNORMAL HIGH (ref 70–99)
Potassium: 3.8 mmol/L (ref 3.5–5.1)
Sodium: 138 mmol/L (ref 135–145)

## 2022-04-18 LAB — GLUCOSE, CAPILLARY
Glucose-Capillary: 137 mg/dL — ABNORMAL HIGH (ref 70–99)
Glucose-Capillary: 140 mg/dL — ABNORMAL HIGH (ref 70–99)
Glucose-Capillary: 166 mg/dL — ABNORMAL HIGH (ref 70–99)
Glucose-Capillary: 171 mg/dL — ABNORMAL HIGH (ref 70–99)
Glucose-Capillary: 172 mg/dL — ABNORMAL HIGH (ref 70–99)
Glucose-Capillary: 180 mg/dL — ABNORMAL HIGH (ref 70–99)

## 2022-04-18 LAB — CBC
HCT: 39.1 % (ref 39.0–52.0)
Hemoglobin: 12.8 g/dL — ABNORMAL LOW (ref 13.0–17.0)
MCH: 31.1 pg (ref 26.0–34.0)
MCHC: 32.7 g/dL (ref 30.0–36.0)
MCV: 95.1 fL (ref 80.0–100.0)
Platelets: 241 10*3/uL (ref 150–400)
RBC: 4.11 MIL/uL — ABNORMAL LOW (ref 4.22–5.81)
RDW: 14.4 % (ref 11.5–15.5)
WBC: 11.2 10*3/uL — ABNORMAL HIGH (ref 4.0–10.5)
nRBC: 0 % (ref 0.0–0.2)

## 2022-04-18 LAB — CYTOLOGY - NON PAP

## 2022-04-18 LAB — LACTATE DEHYDROGENASE: LDH: 100 U/L (ref 98–192)

## 2022-04-18 SURGERY — LEFT HEART CATH AND CORONARY ANGIOGRAPHY
Anesthesia: Moderate Sedation

## 2022-04-18 MED ORDER — MIDAZOLAM HCL 2 MG/2ML IJ SOLN
INTRAMUSCULAR | Status: DC | PRN
Start: 2022-04-18 — End: 2022-04-18
  Administered 2022-04-18: 1 mg via INTRAVENOUS

## 2022-04-18 MED ORDER — ASPIRIN 81 MG PO CHEW
CHEWABLE_TABLET | ORAL | Status: AC
  Filled 2022-04-18: qty 4

## 2022-04-18 MED ORDER — HEPARIN (PORCINE) IN NACL 1000-0.9 UT/500ML-% IV SOLN
INTRAVENOUS | Status: AC
  Filled 2022-04-18: qty 1000

## 2022-04-18 MED ORDER — IOHEXOL 300 MG/ML  SOLN
INTRAMUSCULAR | Status: DC | PRN
  Administered 2022-04-18: 60 mL

## 2022-04-18 MED ORDER — VERAPAMIL HCL 2.5 MG/ML IV SOLN
INTRAVENOUS | Status: AC
  Filled 2022-04-18: qty 2

## 2022-04-18 MED ORDER — FUROSEMIDE 20 MG PO TABS
20.0000 mg | ORAL_TABLET | Freq: Every day | ORAL | Status: DC
  Administered 2022-04-18 – 2022-04-19 (×2): 20 mg via ORAL
  Filled 2022-04-18 (×2): qty 1

## 2022-04-18 MED ORDER — FENTANYL CITRATE (PF) 100 MCG/2ML IJ SOLN
INTRAMUSCULAR | Status: AC
  Filled 2022-04-18: qty 2

## 2022-04-18 MED ORDER — MIDAZOLAM HCL 2 MG/2ML IJ SOLN
INTRAMUSCULAR | Status: AC
  Filled 2022-04-18: qty 2

## 2022-04-18 MED ORDER — ASPIRIN 81 MG PO CHEW
CHEWABLE_TABLET | ORAL | Status: DC | PRN
  Administered 2022-04-18: 324 mg via ORAL

## 2022-04-18 MED ORDER — ASPIRIN 81 MG PO CHEW
81.0000 mg | CHEWABLE_TABLET | Freq: Every day | ORAL | Status: DC
  Administered 2022-04-19: 81 mg via ORAL
  Filled 2022-04-18: qty 1

## 2022-04-18 MED ORDER — APIXABAN 5 MG PO TABS: 5.0000 mg | ORAL_TABLET | Freq: Two times a day (BID) | ORAL | Status: DC

## 2022-04-18 MED ORDER — LIDOCAINE HCL (PF) 1 % IJ SOLN
INTRAMUSCULAR | Status: DC | PRN
  Administered 2022-04-18: 2 mL

## 2022-04-18 MED ORDER — LIDOCAINE HCL 1 % IJ SOLN
INTRAMUSCULAR | Status: AC
  Filled 2022-04-18: qty 20

## 2022-04-18 MED ORDER — VERAPAMIL HCL 2.5 MG/ML IV SOLN
INTRAVENOUS | Status: DC | PRN
  Administered 2022-04-18: 2.5 mg via INTRA_ARTERIAL

## 2022-04-18 MED ORDER — FENTANYL CITRATE (PF) 100 MCG/2ML IJ SOLN
INTRAMUSCULAR | Status: DC | PRN
  Administered 2022-04-18: 25 ug via INTRAVENOUS

## 2022-04-18 MED ORDER — HEPARIN SODIUM (PORCINE) 1000 UNIT/ML IJ SOLN
INTRAMUSCULAR | Status: AC
  Filled 2022-04-18: qty 10

## 2022-04-18 MED ORDER — HEPARIN SODIUM (PORCINE) 1000 UNIT/ML IJ SOLN
INTRAMUSCULAR | Status: DC | PRN
  Administered 2022-04-18: 5000 [IU] via INTRAVENOUS

## 2022-04-18 SURGICAL SUPPLY — 10 items
CATH 5FR JL3.5 JR4 ANG PIG MP (CATHETERS) ×1 IMPLANT
DEVICE RAD TR BAND REGULAR (VASCULAR PRODUCTS) ×1 IMPLANT
DRAPE BRACHIAL (DRAPES) ×1 IMPLANT
GLIDESHEATH SLEND SS 6F .021 (SHEATH) ×1 IMPLANT
GUIDEWIRE INQWIRE 1.5J.035X260 (WIRE) IMPLANT
INQWIRE 1.5J .035X260CM (WIRE) ×2
PACK CARDIAC CATH (CUSTOM PROCEDURE TRAY) ×2 IMPLANT
PROTECTION STATION PRESSURIZED (MISCELLANEOUS) ×2
SET ATX SIMPLICITY (MISCELLANEOUS) ×1 IMPLANT
STATION PROTECTION PRESSURIZED (MISCELLANEOUS) IMPLANT

## 2022-04-18 NOTE — Progress Notes (Signed)
PROGRESS NOTE    Adam Jennings  ZOX:[id number] DOB: 25-Mar-1946 DOA: 04/17/2022 PCP: Doreene Nest, NP  Outpatient Specialists: cardiology    Brief Narrative:  From admission h and p Adam Jennings is a 76 y.o. male with medical history significant for chronic systolic heart failure (last known LVEF of 40 to 45%), history of paroxysmal A-fib, hypertension, diabetes mellitus who presented for scheduled outpatient heart catheterization but has complaints of right anterior chest wall pain which he has had for 3 days and has worsened since then.  He describes his pain as sharp and pleuritic in nature and rated about an 8 x 10 in intensity at its worst.  He has been unable to lay flat in bed due to difficulty breathing and has had to sleep in a recliner for several days.  He complains of shortness of breath which initially he had with exertion but now is short of breath at rest.  He has been off his diuretic for over a month and was advised to take for weight gain.  He states that his weight has been stable over the last several weeks. He denies having any fever or chills, no cough, no leg swelling, no dizziness, no lightheadedness, no headache, no blurred vision or focal deficit. He had a chest x-ray which showed a moderate right-sided pleural effusion.  He will be admitted to the hospital for further evaluation.   Assessment & Plan:   Principal Problem:   Pleural effusion on right Active Problems:   Essential hypertension   Chronic systolic heart failure (HCC)   Diabetes mellitus (HCC)   Paroxysmal atrial fibrillation (HCC)  # Chest pain Left heart cath on 5/26 shows moderate CAD. No PCI. - start aspirin, continue eliquis beginning tomorrow - continue statin  # Pleural effusion Seen on CXR in March. Here presented with worsening dyspnea worse with exertion and lying flat. Bnp wnl. 1.3 liters removed on 5/25. Exudative per light's criteria. No clear infectious symptoms. Culture  NGTD. Symptomatically feeling much better, no o2 requirement. Cytology pending. No suspicious masses on 01/2022 CTA. Total nucleated cell count relatively low (1356) with lympocytic predominance, not suggestive of infectin. I touched base w/ pulm (brecia) who advises further inpatient w/u should symptoms recur, otherwise outpt f/u - will monitor overnight, repeat cxr in AM - f/u culture, cytology, will add on acid fast smear/culture, add on triglycerides  # HFrEF Compensated - cont home lasix, metop - entresto started  # paroxysmal a fib Rate controlled - cont home metoprolol - resume eliquis tomorrow assuming no bleeding  # HTN Controlled - cont metop, amiodarone  # T2DM Home orals on hold - SSI for now  # BPH - cont finasteride   DVT prophylaxis: SCDs Code Status: full Family Communication: daughter (main contact) updated telephonically 5/26  Level of care: Telemetry Cardiac Status is: Inpatient Remains inpatient appropriate because: need for inpatient monitoring    Consultants:  cardiology  Procedures: Left heart cath, thoracentesis  Antimicrobials:  none    Subjective: Breathing much improved. No chest pain. No fever or cough.   Objective: Vitals:   04/18/22 1000 04/18/22 1030 04/18/22 1100 04/18/22 1128  BP: (!) 119/57 123/64 (!) 119/58 (!) 107/55  Pulse: (!) 58 65  61  Resp: (!) 21 (!) 29  20  Temp:    98 F (36.7 C)  TempSrc:    Oral  SpO2: 94% 91%  93%  Weight:        Intake/Output Summary (Last 24  hours) at 04/18/2022 1500 Last data filed at 04/18/2022 1354 Gross per 24 hour  Intake 120 ml  Output 650 ml  Net -530 ml   Filed Weights   04/18/22 0500  Weight: 127.2 kg    Examination:  General exam: Appears calm and comfortable  Respiratory system: Clear to auscultation, rales at bases Cardiovascular system: S1 & S2 heard, RRR. No JVD, murmurs, rubs, gallops or clicks. no pedal edema. Gastrointestinal system: Abdomen is obese, soft and  nontender. No organomegaly or masses felt. Normal bowel sounds heard. Central nervous system: Alert and oriented. No focal neurological deficits. Extremities: Symmetric 5 x 5 power. Skin: No rashes, lesions or ulcers Psychiatry: Judgement and insight appear normal. Mood & affect appropriate.     Data Reviewed: I have personally reviewed following labs and imaging studies  CBC: Recent Labs  Lab 04/17/22 0812 04/18/22 0523  WBC 12.3* 11.2*  HGB 13.2 12.8*  HCT 40.7 39.1  MCV 94.4 95.1  PLT 230 241   Basic Metabolic Panel: Recent Labs  Lab 04/17/22 0812 04/18/22 0523  NA 135 138  K 4.2 3.8  CL 104 103  CO2 26 25  GLUCOSE 143* 140*  BUN 22 35*  CREATININE 1.08 1.25*  CALCIUM 8.9 8.8*   GFR: Estimated Creatinine Clearance: 71.4 mL/min (A) (by C-G formula based on SCr of 1.25 mg/dL (H)). Liver Function Tests: Recent Labs  Lab 04/17/22 0812  AST 22  ALT 19  ALKPHOS 51  BILITOT 1.1  PROT 7.7  ALBUMIN 3.9   No results for input(s): LIPASE, AMYLASE in the last 168 hours. No results for input(s): AMMONIA in the last 168 hours. Coagulation Profile: No results for input(s): INR, PROTIME in the last 168 hours. Cardiac Enzymes: No results for input(s): CKTOTAL, CKMB, CKMBINDEX, TROPONINI in the last 168 hours. BNP (last 3 results) Recent Labs    01/30/22 1344  PROBNP 109.0*   HbA1C: No results for input(s): HGBA1C in the last 72 hours. CBG: Recent Labs  Lab 04/17/22 1946 04/17/22 2347 04/18/22 0326 04/18/22 0710 04/18/22 1149  GLUCAP 187* 140* 166* 137* 172*   Lipid Profile: No results for input(s): CHOL, HDL, LDLCALC, TRIG, CHOLHDL, LDLDIRECT in the last 72 hours. Thyroid Function Tests: No results for input(s): TSH, T4TOTAL, FREET4, T3FREE, THYROIDAB in the last 72 hours. Anemia Panel: No results for input(s): VITAMINB12, FOLATE, FERRITIN, TIBC, IRON, RETICCTPCT in the last 72 hours. Urine analysis:    Component Value Date/Time   COLORURINE YELLOW  02/13/2016 0059   APPEARANCEUR Clear 12/25/2021 1421   LABSPEC 1.020 02/13/2016 0059   PHURINE 6.5 02/13/2016 0059   GLUCOSEU Negative 12/25/2021 1421   HGBUR NEGATIVE 02/13/2016 0059   BILIRUBINUR Negative 12/25/2021 1421   KETONESUR NEGATIVE 02/13/2016 0059   PROTEINUR Negative 12/25/2021 1421   PROTEINUR NEGATIVE 02/13/2016 0059   NITRITE Negative 12/25/2021 1421   NITRITE NEGATIVE 02/13/2016 0059   LEUKOCYTESUR Negative 12/25/2021 1421   Sepsis Labs: @LABRCNTIP (procalcitonin:4,lacticidven:4)  ) Recent Results (from the past 240 hour(s))  Body fluid culture w Gram Stain     Status: None (Preliminary result)   Collection Time: 04/17/22 10:59 AM   Specimen: PATH Cytology Pleural fluid  Result Value Ref Range Status   Specimen Description   Final    PLEURAL Performed at [facility], [address] [place], [place] [address]    Special Requests   Final    NONE Performed at [facility], [address] [place], [place] [address]  Gram Stain   Final    WBC PRESENT, PREDOMINANTLY PMN NO ORGANISMS SEEN CYTOSPIN SMEAR    Culture   Final    NO GROWTH < 24 HOURS Performed at [facility], [address], [place], [place] [address]    Report Status PENDING  Incomplete         Radiology Studies: CARDIAC CATHETERIZATION  Result Date: 04/18/2022   1st Mrg lesion is 30% stenosed.   Prox LAD to Mid LAD lesion is 50% stenosed.   LPAV lesion is 70% stenosed with 70% stenosed side branch in LPDA.   LV end diastolic pressure is normal.   There is no aortic valve stenosis. Conclusion: Moderate coronary artery disease including diffusely diseased moderate caliber LAD with proximal 50% stenosis, and ectatic coronary disease of the left dominant system with a long 70% stenosis of the L PDA. Normal left ventricular end-diastolic pressure estimated at 18 mmHg Recommendations: Medical management of coronary artery disease.  We will start aspirin at discharge in  addition to his Eliquis.  He may resume Eliquis tomorrow morning if no bleeding issues following his procedure. Intensification of statin therapy with goal LDL less than 70 Medical management of heart failure Could consider PCI to the L PDA following complete lab results of his recent pleural fluid sent for cytology if patient develops angina.  LAD does not appear suitable for PCI given the diffuse nature of his disease. Follow-up with me in 2 weeks.   US Venous Img Lower Unilateral Right (DVT)  Result Date: 04/17/2022 CLINICAL DATA:  Right calf edema. EXAM: RIGHT LOWER EXTREMITY VENOUS DOPPLER ULTRASOUND TECHNIQUE: Gray-scale sonography with graded compression, as well as color Doppler and duplex ultrasound were performed to evaluate the lower extremity deep venous systems from the level of the common femoral vein and including the common femoral, femoral, profunda femoral, popliteal and calf veins including the posterior tibial, peroneal and gastrocnemius veins when visible. The superficial great saphenous vein was also interrogated. Spectral Doppler was utilized to evaluate flow at rest and with distal augmentation maneuvers in the common femoral, femoral and popliteal veins. COMPARISON:  None Available. FINDINGS: Contralateral Common Femoral Vein: Respiratory phasicity is normal and symmetric with the symptomatic side. No evidence of thrombus. Normal compressibility. Common Femoral Vein: No evidence of thrombus. Normal compressibility, respiratory phasicity and response to augmentation. Saphenofemoral Junction: No evidence of thrombus. Normal compressibility and flow on color Doppler imaging. Profunda Femoral Vein: No evidence of thrombus. Normal compressibility and flow on color Doppler imaging. Femoral Vein: No evidence of thrombus. Normal compressibility, respiratory phasicity and response to augmentation. Popliteal Vein: No evidence of thrombus. Normal compressibility, respiratory phasicity and response  to augmentation. Calf Veins: No evidence of thrombus. Normal compressibility and flow on color Doppler imaging. Superficial Great Saphenous Vein: No evidence of thrombus. Normal compressibility. Venous Reflux:  None. Other Findings: No evidence of superficial thrombophlebitis or abnormal fluid collection. IMPRESSION: No evidence of right lower extremity deep venous thrombosis. Electronically Signed   By: Irish Lack M.D.   On: 04/17/2022 11:58   DG Chest Port 1 View  Result Date: 04/17/2022 CLINICAL DATA:  Status post right thoracentesis. EXAM: PORTABLE CHEST 1 VIEW COMPARISON:  Chest radiograph 04/17/2022 at 7:59 a.m. FINDINGS: The cardiac silhouette remains enlarged. The lungs are hypoinflated. There is a small residual right pleural effusion, decreased in size following interval thoracentesis. Right basilar lung aeration has improved. No pneumothorax is identified. IMPRESSION: Decreased size of right pleural effusion following thoracentesis. No pneumothorax.  Electronically Signed   By: Sebastian Ache M.D.   On: 04/17/2022 11:31   DG Chest Port 1 View  Result Date: 04/17/2022 CLINICAL DATA:  Shortness of breath. EXAM: PORTABLE CHEST 1 VIEW COMPARISON:  Chest radiographs and CTA 01/30/2022 FINDINGS: The cardiac silhouette remains enlarged. A small to moderate right pleural effusion is stable to mildly increased in size with adjacent right basilar atelectasis or consolidation. The left lung is clear. No pneumothorax is identified. No acute osseous abnormality is seen. IMPRESSION: Stable to mildly increased size of a small to moderate right pleural effusion with right basilar atelectasis or consolidation. Electronically Signed   By: Sebastian Ache M.D.   On: 04/17/2022 11:30   US THORACENTESIS ASP PLEURAL SPACE W/IMG GUIDE  Result Date: 04/17/2022 INDICATION: Patient with history of CHF, HTN, dm type 2 complaining of right anterior chest pain and shortness of breath x3 days. Request for diagnostic and  therapeutic right thoracentesis. EXAM: ULTRASOUND GUIDED DIAGNOSTIC AND THERAPEUTIC RIGHT THORACENTESIS MEDICATIONS: 5 mL 1% lidocaine COMPLICATIONS: None immediate. PROCEDURE: An ultrasound guided thoracentesis was thoroughly discussed with the patient and questions answered. The benefits, risks, alternatives and complications were also discussed. The patient understands and wishes to proceed with the procedure. Written consent was obtained. Ultrasound was performed to localize and mark an adequate pocket of fluid in the right chest. The area was then prepped and draped in the normal sterile fashion. 1% Lidocaine was used for local anesthesia. Under ultrasound guidance a 6 Fr Safe-T-Centesis catheter was introduced. Thoracentesis was performed. The catheter was removed and a dressing applied. FINDINGS: A total of approximately 1.3 L of hazy, dark yellow fluid was removed. Samples were sent to the laboratory as requested by the clinical team. IMPRESSION: Successful ultrasound guided right thoracentesis yielding 1.3 L of pleural fluid. Read by: Alex Gardener, AGNP-BC Electronically Signed   By: Irish Lack M.D.   On: 04/17/2022 11:24        Scheduled Meds:  amiodarone  200 mg Oral Daily   [START ON 04/19/2022] apixaban  5 mg Oral BID   [START ON 04/19/2022] aspirin  81 mg Oral Daily   finasteride  5 mg Oral Daily   insulin aspart  0-15 Units Subcutaneous TID WC   metoprolol succinate  12.5 mg Oral Daily   omega-3 acid ethyl esters  1,000 mg Oral Daily   rosuvastatin  20 mg Oral Daily   sacubitril-valsartan  1 tablet Oral BID   sodium chloride flush  3 mL Intravenous Q12H   sodium chloride flush  3 mL Intravenous Q12H   sodium chloride flush  3 mL Intravenous Q12H   Continuous Infusions:  sodium chloride 250 mL (04/18/22 0824)     LOS: 1 day     Silvano Bilis, MD [facility]   If 7PM-7AM, please contact night-coverage [email or web address] Password [email or web address] 04/18/2022, 3:00 PM

## 2022-04-18 NOTE — Interval H&P Note (Signed)
History and Physical Interval Note:  04/18/2022 8:31 AM  Adam Jennings  has presented today for surgery, with the diagnosis of Left Heart Cath with possible intervention for evaluation of  Stable angina  Abnormal cardiovascular stress test.  The various methods of treatment have been discussed with the patient and family. After consideration of risks, benefits and other options for treatment, the patient has consented to  Procedure(s): LEFT HEART CATH AND CORONARY ANGIOGRAPHY (N/A) as a surgical intervention.  The patient's history has been reviewed, patient examined, no change in status, stable for surgery.  I have reviewed the patient's chart and labs.  Questions were answered to the patient's satisfaction.     Adam Jennings [name] [place]

## 2022-04-18 NOTE — Consult Note (Signed)
[facility] Cardiology  CARDIOLOGY CONSULT NOTE  Patient ID: Adam Jennings MRN: [id number] DOB/AGE: 1946-11-12 76 y.o.  Admit date: 04/17/2022 Referring Physician NA Primary Physician Doreene Nest, NP Primary Cardiologist Armando Reichert, MD Reason for Consultation Shortness of breath  HPI:  Adam Jennings is a 76 year old male with a history of HFrEF (EF 40 to 45%), paroxysmal atrial fibrillation, essential hypertension, type 2 diabetes who presented today for scheduled outpatient heart catheterization.  He was admitted prior to his procedure for shortness of breath and a newly discovered right-sided pleural effusion.    Interval history: -Shortness of breath improved today following thoracentesis. -Heart catheterization completed today showed moderate diffuse coronary artery disease, with nothing appealing for PCI given the diffuse nature of his disease.  Review of systems complete and found to be negative unless listed above     Past Medical History:  Diagnosis Date   Arrhythmia    atrial fibrillation   BPH (benign prostatic hyperplasia)    CHF (congestive heart failure) (HCC)    Diabetes mellitus without complication (HCC)    Dizziness    Elevated blood pressure    Hernia of abdominal wall    Hypertension     Past Surgical History:  Procedure Laterality Date   CATARACT EXTRACTION W/ INTRAOCULAR LENS IMPLANT Right 11/05/2017   CATARACT EXTRACTION W/ INTRAOCULAR LENS IMPLANT Left 12/08/2017    Medications Prior to Admission  Medication Sig Dispense Refill Last Dose   amiodarone (PACERONE) 200 MG tablet Take 1 tablet (200 mg total) by mouth daily. 30 tablet 1 04/16/2022   apixaban (ELIQUIS) 5 MG TABS tablet Take 1 tablet (5 mg total) by mouth 2 (two) times daily. 60 tablet 1 04/15/2022   dapagliflozin propanediol (FARXIGA) 10 MG TABS tablet Take 1 tablet (10 mg total) by mouth daily before breakfast. 30 tablet 5 04/16/2022   finasteride (PROSCAR) 5 MG tablet TAKE 1 TABLET (5  MG TOTAL) BY MOUTH DAILY. 90 tablet 3 04/16/2022   furosemide (LASIX) 20 MG tablet TAKE 1 TABLET BY MOUTH EVERY DAY 30 tablet 5 04/16/2022   metFORMIN (GLUCOPHAGE-XR) 500 MG 24 hr tablet TAKE 1 TABLET (500 MG TOTAL) BY MOUTH DAILY WITH BREAKFAST. FOR DIABETES. 90 tablet 0 04/16/2022   metoprolol succinate (TOPROL-XL) 25 MG 24 hr tablet Take 0.5 tablets (12.5 mg total) by mouth daily. 30 tablet 0 04/16/2022   Omega-3 Fatty Acids (FISH OIL) 1000 MG CAPS Take 1,000 mg by mouth daily.   04/16/2022   rosuvastatin (CRESTOR) 20 MG tablet Take 1 tablet (20 mg total) by mouth daily. For cholesterol. Have patient call the office! 90 tablet 0 04/16/2022   sacubitril-valsartan (ENTRESTO) 24-26 MG Take 1 tablet by mouth 2 (two) times daily.   04/16/2022   Social History   Socioeconomic History   Marital status: Married    Spouse name: Not on file   Number of children: Not on file   Years of education: Not on file   Highest education level: Not on file  Occupational History   Not on file  Tobacco Use   Smoking status: Never    Passive exposure: Never   Smokeless tobacco: Never  Vaping Use   Vaping Use: Never used  Substance and Sexual Activity   Alcohol use: No    Alcohol/week: 0.0 standard drinks   Drug use: No   Sexual activity: Not Currently  Other Topics Concern   Not on file  Social History Narrative   Married.   1 daughter, 4 grandchildren.  Works in [employer] in [place].   Enjoys working on tractors.    Social Determinants of Health   Financial Resource Strain: Not on file  Food Insecurity: Not on file  Transportation Needs: Not on file  Physical Activity: Not on file  Stress: Not on file  Social Connections: Not on file  Intimate Partner Violence: Not on file    Family History  Problem Relation Age of Onset   Hypertension Father       Review of systems complete and found to be negative unless listed above      PHYSICAL EXAM  General: Well developed, well nourished, in no  acute distress HEENT:  Normocephalic and atramatic Neck:  No JVD.  Lungs: Clear bilaterally to auscultation and percussion. Heart: HRRR . Normal S1 and S2 without gallops or murmurs.  Abdomen: Bowel sounds are positive, abdomen soft and non-tender  Msk:  Back normal, normal gait. Normal strength and tone for age. Extremities: No clubbing, cyanosis or edema.   Neuro: Alert and oriented X 3. Psych:  Good affect, responds appropriately  Labs:   Lab Results  Component Value Date   WBC 12.3 (H) 04/17/2022   HGB 13.2 04/17/2022   HCT 40.7 04/17/2022   MCV 94.4 04/17/2022   PLT 230 04/17/2022   No results for input(s): NA, K, CL, CO2, BUN, CREATININE, CALCIUM, PROT, BILITOT, ALKPHOS, ALT, AST, GLUCOSE in the last 168 hours.  Invalid input(s): LABALBU Lab Results  Component Value Date   TROPONINI <0.03 02/12/2016    Lab Results  Component Value Date   CHOL 155 02/18/2022   CHOL 177 11/26/2021   CHOL 172 10/23/2020   Lab Results  Component Value Date   HDL 30.00 (L) 02/18/2022   HDL 32.50 (L) 11/26/2021   HDL 39.80 10/23/2020   Lab Results  Component Value Date   LDLCALC 97 02/18/2022   LDLCALC 123 (H) 11/26/2021   LDLCALC 115 (H) 10/23/2020   Lab Results  Component Value Date   TRIG 141.0 02/18/2022   TRIG 105.0 11/26/2021   TRIG 86.0 10/23/2020   Lab Results  Component Value Date   CHOLHDL 5 02/18/2022   CHOLHDL 5 11/26/2021   CHOLHDL 4 10/23/2020   No results found for: LDLDIRECT    Radiology: No results found.  EKG: Sinus brady cardia.   ASSESSMENT AND PLAN:  Aldin Jennings is a 76 year old male with a history of HFrEF (EF 40 to 45%), paroxysmal atrial fibrillation, essential hypertension, type 2 diabetes who presented today for scheduled outpatient heart catheterization.  He describes worsening shortness of breath and orthopnea for the last few days, as well as right-sided chest discomfort worse with taking deep breaths.  #Acute on chronic heart failure (EF  40 to 45%) # Moderate coronary artery disease Discovered to have heart failure and 2023 with an EF of 40 to 45%.  Underwent stress testing as an outpatient which was abnormal prompting heart catheterization which was completed today that showed moderate CAD including a 70% L PDA stenosis, and a diffusely diseased LAD with proximal 50% disease.  Medical management seems appropriate, especially while awaiting cytology for his recent right pleural effusion. -BNP was normal on admission.  Would hold further diuresis. -Recommend starting aspirin 81 mg for coronary artery disease -Continue Crestor 20 mg daily -Continue home heart failure medications -Patient is stable for discharge from a cardiac perspective this afternoon after his TR band is removed.  He may resume Eliquis tomorrow.  Please schedule follow-up with me in  1 to 2 weeks.  #Pleuritic right-sided chest pain #Right-sided pleural effusion Presented with pleuritic right-sided chest pain and shortness of breath for his outpatient heart catheterization.  Noted to have a moderate right-sided pleural effusion for which she went for thoracentesis on 04/17/2022 with removal of 1.3 L of cloudy appearing fluid with significant white cells.  We are awaiting cytology.  #Paroxysmal atrial fibrillation Occurred in March 2023 in the setting of new onset heart failure.  He has been on amiodarone since that time. -Eliquis has been on hold since 04/15/2022 in anticipation of heart catheterization.  Okay to resume tomorrow   Signed: Armando ReichertYAN [name] Adlee Paar MD 04/17/2022, 8:26 AM

## 2022-04-18 NOTE — Progress Notes (Addendum)
Patient received from cath lab. Right wrist gauze dressing clean, dry, and intact. Patinet VS WNL and no c/o of pain. Patient has 48 hours restriction for no use on right hand.

## 2022-04-19 ENCOUNTER — Inpatient Hospital Stay: Payer: Medicare HMO

## 2022-04-19 DIAGNOSIS — J90 Pleural effusion, not elsewhere classified: Secondary | ICD-10-CM | POA: Diagnosis not present

## 2022-04-19 LAB — BASIC METABOLIC PANEL
Anion gap: 8 (ref 5–15)
BUN: 31 mg/dL — ABNORMAL HIGH (ref 8–23)
CO2: 27 mmol/L (ref 22–32)
Calcium: 8.5 mg/dL — ABNORMAL LOW (ref 8.9–10.3)
Chloride: 101 mmol/L (ref 98–111)
Creatinine, Ser: 1.19 mg/dL (ref 0.61–1.24)
GFR, Estimated: >60 mL/min (ref >60)
Glucose, Bld: 144 mg/dL — ABNORMAL HIGH (ref 70–99)
Potassium: 4.2 mmol/L (ref 3.5–5.1)
Sodium: 136 mmol/L (ref 135–145)

## 2022-04-19 LAB — GLUCOSE, CAPILLARY
Glucose-Capillary: 144 mg/dL — ABNORMAL HIGH (ref 70–99)
Glucose-Capillary: 135 mg/dL — ABNORMAL HIGH (ref 70–99)

## 2022-04-19 MED ORDER — SODIUM CHLORIDE 0.9% FLUSH: 3.0000 mL | INTRAVENOUS | Status: DC | PRN

## 2022-04-19 MED ORDER — SODIUM CHLORIDE 0.9 % IV SOLN: 250.0000 mL | INTRAVENOUS | Status: DC | PRN

## 2022-04-19 MED ORDER — SODIUM CHLORIDE 0.9% FLUSH
3.0000 mL | Freq: Two times a day (BID) | INTRAVENOUS | Status: DC
  Administered 2022-04-19: 3 mL via INTRAVENOUS

## 2022-04-19 MED ORDER — ASPIRIN 81 MG PO CHEW
81.0000 mg | CHEWABLE_TABLET | Freq: Every day | ORAL | 1 refills | Status: AC
Start: 2022-04-20 — End: ?

## 2022-04-19 NOTE — Discharge Summary (Signed)
Adam Jennings TIR:[id number] DOB: 1946/07/17 DOA: 04/17/2022  PCP: Doreene Nest, NP  Admit date: 04/17/2022 Discharge date: 04/19/2022  Time spent: 35 minutes  Recommendations for Outpatient Follow-up:  Cardiology, pcp, and pulmonology f/u  F/u pending pleural fluid analyses (acid fast stain with culture, cholesterol, final results of culture)    Discharge Diagnoses:  Principal Problem:   Pleural effusion on right Active Problems:   Essential hypertension   Chronic systolic heart failure (HCC)   Diabetes mellitus (HCC)   Paroxysmal atrial fibrillation (HCC)   Discharge Condition: improved  Diet recommendation: heart healthy  Filed Weights   04/18/22 0500  Weight: 127.2 kg    History of present illness:  From admission h and p Adam Jennings is a 76 y.o. male with medical history significant for chronic systolic heart failure (last known LVEF of 40 to 45%), history of paroxysmal A-fib, hypertension, diabetes mellitus who presented for scheduled outpatient heart catheterization but has complaints of right anterior chest wall pain which he has had for 3 days and has worsened since then.  He describes his pain as sharp and pleuritic in nature and rated about an 8 x 10 in intensity at its worst.  He has been unable to lay flat in bed due to difficulty breathing and has had to sleep in a recliner for several days.  He complains of shortness of breath which initially he had with exertion but now is short of breath at rest.  He has been off his diuretic for over a month and was advised to take for weight gain.  He states that his weight has been stable over the last several weeks. He denies having any fever or chills, no cough, no leg swelling, no dizziness, no lightheadedness, no headache, no blurred vision or focal deficit. He had a chest x-ray which showed a moderate right-sided pleural effusion.  He will be admitted to the hospital for further evaluation.  Hospital Course:   # Chest pain Left heart cath on 5/26 shows moderate CAD. No PCI. Chest pain resolved - start aspirin, continue eliquis and remainder of home meds - cardiology f/u 1-2 weeks   # Pleural effusion Seen on CXR in March. Here presented with worsening dyspnea worse with exertion and lying flat. Bnp wnl. 1.3 liters removed on 5/25. Exudative per light's criteria. No clear infectious symptoms. Culture NGTD. Symptomatically feeling much better, no o2 requirement. Cytology negative, no suspicious masses on 01/2022 CTA. Total nucleated cell count relatively low (1356) with lympocytic predominance, not suggestive of infectin. Repeat cxr day of discharge suggestive of worsening effusion but symptomatically patient breathing comfortably, ambulating without dyspnea, no o2 requirement. I discussed case with pulmonolog (brecia) who advises no further inpatient w/u, instead outpatient pulm f/u. Several pleural fluid studies are pending at time of discharge. We did review return precautions   # HFrEF Compensated. Home meds continued   # paroxysmal a fib Rate controlled, asymptomatic   # HTN Controlled   # T2DM Controlled - SSI for now   Procedures: Left heart catheterization   Consultations: cardiology  Discharge Exam: Vitals:   04/19/22 0313 04/19/22 0716  BP: (!) 99/59 115/62  Pulse: 80 75  Resp: 17 17  Temp: 98 F (36.7 C) 98.1 F (36.7 C)  SpO2: 95% 95%    General: NAD Cardiovascular: RRR, soft systolic murmur Respiratory: diminished breath sounds right Abdomen: soft Ext: no edema, warm  Discharge Instructions   Discharge Instructions     Ambulatory referral to  Pulmonology   Complete by: As directed    Reason for referral: Pleural Effusion   Diet - low sodium heart healthy   Complete by: As directed    Increase activity slowly   Complete by: As directed       Allergies as of 04/19/2022   No Known Allergies      Medication List     STOP taking these medications     meloxicam 15 MG tablet Commonly known as: MOBIC       TAKE these medications    amiodarone 200 MG tablet Commonly known as: PACERONE Take 1 tablet (200 mg total) by mouth daily.   apixaban 5 MG Tabs tablet Commonly known as: ELIQUIS Take 1 tablet (5 mg total) by mouth 2 (two) times daily.   aspirin 81 MG chewable tablet Chew 1 tablet (81 mg total) by mouth daily. Start taking on: Apr 20, 2022   dapagliflozin propanediol 10 MG Tabs tablet Commonly known as: Farxiga Take 1 tablet (10 mg total) by mouth daily before breakfast.   Entresto 24-26 MG Generic drug: sacubitril-valsartan Take 1 tablet by mouth 2 (two) times daily.   finasteride 5 MG tablet Commonly known as: PROSCAR TAKE 1 TABLET (5 MG TOTAL) BY MOUTH DAILY.   Fish Oil 1000 MG Caps Take 1,000 mg by mouth daily.   furosemide 20 MG tablet Commonly known as: LASIX TAKE 1 TABLET BY MOUTH EVERY DAY   metFORMIN 500 MG 24 hr tablet Commonly known as: GLUCOPHAGE-XR TAKE 1 TABLET (500 MG TOTAL) BY MOUTH DAILY WITH BREAKFAST. FOR DIABETES.   metoprolol succinate 25 MG 24 hr tablet Commonly known as: TOPROL-XL Take 0.5 tablets (12.5 mg total) by mouth daily.   rosuvastatin 20 MG tablet Commonly known as: CRESTOR Take 1 tablet (20 mg total) by mouth daily. For cholesterol. Have patient call the office!       No Known Allergies  Follow-up Information     Armando Reichert, MD Follow up.   Specialty: Cardiology Why: 1-2 Contact information: [address] [place] [address] [phone]         Doreene Nest, NP Follow up.   Specialty: Internal Medicine Contact information: [address] [place] [address] [phone]         Vida Rigger, MD Follow up.   Specialty: Pulmonary Disease Contact information: [address] [place] [address] [phone]                  The results of significant diagnostics from this hospitalization  (including imaging, microbiology, ancillary and laboratory) are listed below for reference.    Significant Diagnostic Studies: CARDIAC CATHETERIZATION  Result Date: 04/18/2022   1st Mrg lesion is 30% stenosed.   Prox LAD to Mid LAD lesion is 50% stenosed.   LPAV lesion is 70% stenosed with 70% stenosed side branch in LPDA.   LV end diastolic pressure is normal.   There is no aortic valve stenosis. Conclusion: Moderate coronary artery disease including diffusely diseased moderate caliber LAD with proximal 50% stenosis, and ectatic coronary disease of the left dominant system with a long 70% stenosis of the L PDA. Normal left ventricular end-diastolic pressure estimated at 18 mmHg Recommendations: Medical management of coronary artery disease.  We will start aspirin at discharge in addition to his Eliquis.  He may resume Eliquis tomorrow morning if no bleeding issues following his procedure. Intensification of statin therapy with goal LDL less than 70 Medical management  of heart failure Could consider PCI to the L PDA following complete lab results of his recent pleural fluid sent for cytology if patient develops angina.  LAD does not appear suitable for PCI given the diffuse nature of his disease. Follow-up with me in 2 weeks.   US Venous Img Lower Unilateral Right (DVT)  Result Date: 04/17/2022 CLINICAL DATA:  Right calf edema. EXAM: RIGHT LOWER EXTREMITY VENOUS DOPPLER ULTRASOUND TECHNIQUE: Gray-scale sonography with graded compression, as well as color Doppler and duplex ultrasound were performed to evaluate the lower extremity deep venous systems from the level of the common femoral vein and including the common femoral, femoral, profunda femoral, popliteal and calf veins including the posterior tibial, peroneal and gastrocnemius veins when visible. The superficial great saphenous vein was also interrogated. Spectral Doppler was utilized to evaluate flow at rest and with distal augmentation maneuvers in  the common femoral, femoral and popliteal veins. COMPARISON:  None Available. FINDINGS: Contralateral Common Femoral Vein: Respiratory phasicity is normal and symmetric with the symptomatic side. No evidence of thrombus. Normal compressibility. Common Femoral Vein: No evidence of thrombus. Normal compressibility, respiratory phasicity and response to augmentation. Saphenofemoral Junction: No evidence of thrombus. Normal compressibility and flow on color Doppler imaging. Profunda Femoral Vein: No evidence of thrombus. Normal compressibility and flow on color Doppler imaging. Femoral Vein: No evidence of thrombus. Normal compressibility, respiratory phasicity and response to augmentation. Popliteal Vein: No evidence of thrombus. Normal compressibility, respiratory phasicity and response to augmentation. Calf Veins: No evidence of thrombus. Normal compressibility and flow on color Doppler imaging. Superficial Great Saphenous Vein: No evidence of thrombus. Normal compressibility. Venous Reflux:  None. Other Findings: No evidence of superficial thrombophlebitis or abnormal fluid collection. IMPRESSION: No evidence of right lower extremity deep venous thrombosis. Electronically Signed   By: Irish LackGlenn  Yamagata M.D.   On: 04/17/2022 11:58   DG Chest Port 1 View  Result Date: 04/19/2022 CLINICAL DATA:  Follow-up right pleural effusion. EXAM: PORTABLE CHEST 1 VIEW COMPARISON:  04/17/2022 FINDINGS: Stable enlarged cardiac silhouette. Moderate to large sized right pleural effusion with interval increase in size. Mild associated right basilar atelectasis with improvement. Clear left lung. Thoracic spine degenerative changes. IMPRESSION: 1. Moderate to large sized right pleural effusion, increased. 2. Mildly improved right basilar atelectasis. 3. Stable cardiomegaly. Electronically Signed   By: Beckie SaltsSteven  Reid M.D.   On: 04/19/2022 08:52   DG Chest Port 1 View  Result Date: 04/17/2022 CLINICAL DATA:  Status post right  thoracentesis. EXAM: PORTABLE CHEST 1 VIEW COMPARISON:  Chest radiograph 04/17/2022 at 7:59 a.m. FINDINGS: The cardiac silhouette remains enlarged. The lungs are hypoinflated. There is a small residual right pleural effusion, decreased in size following interval thoracentesis. Right basilar lung aeration has improved. No pneumothorax is identified. IMPRESSION: Decreased size of right pleural effusion following thoracentesis. No pneumothorax. Electronically Signed   By: Sebastian AcheAllen  Grady M.D.   On: 04/17/2022 11:31   DG Chest Port 1 View  Result Date: 04/17/2022 CLINICAL DATA:  Shortness of breath. EXAM: PORTABLE CHEST 1 VIEW COMPARISON:  Chest radiographs and CTA 01/30/2022 FINDINGS: The cardiac silhouette remains enlarged. A small to moderate right pleural effusion is stable to mildly increased in size with adjacent right basilar atelectasis or consolidation. The left lung is clear. No pneumothorax is identified. No acute osseous abnormality is seen. IMPRESSION: Stable to mildly increased size of a small to moderate right pleural effusion with right basilar atelectasis or consolidation. Electronically Signed   By: Jolaine ClickAllen  Grady M.D.  On: 04/17/2022 11:30   US THORACENTESIS ASP PLEURAL SPACE W/IMG GUIDE  Result Date: 04/17/2022 INDICATION: Patient with history of CHF, HTN, dm type 2 complaining of right anterior chest pain and shortness of breath x3 days. Request for diagnostic and therapeutic right thoracentesis. EXAM: ULTRASOUND GUIDED DIAGNOSTIC AND THERAPEUTIC RIGHT THORACENTESIS MEDICATIONS: 5 mL 1% lidocaine COMPLICATIONS: None immediate. PROCEDURE: An ultrasound guided thoracentesis was thoroughly discussed with the patient and questions answered. The benefits, risks, alternatives and complications were also discussed. The patient understands and wishes to proceed with the procedure. Written consent was obtained. Ultrasound was performed to localize and mark an adequate pocket of fluid in the right chest.  The area was then prepped and draped in the normal sterile fashion. 1% Lidocaine was used for local anesthesia. Under ultrasound guidance a 6 Fr Safe-T-Centesis catheter was introduced. Thoracentesis was performed. The catheter was removed and a dressing applied. FINDINGS: A total of approximately 1.3 L of hazy, dark yellow fluid was removed. Samples were sent to the laboratory as requested by the clinical team. IMPRESSION: Successful ultrasound guided right thoracentesis yielding 1.3 L of pleural fluid. Read by: Alex Gardener, AGNP-BC Electronically Signed   By: Irish Lack M.D.   On: 04/17/2022 11:24    Microbiology: Recent Results (from the past 240 hour(s))  Body fluid culture w Gram Stain     Status: None (Preliminary result)   Collection Time: 04/17/22 10:59 AM   Specimen: PATH Cytology Pleural fluid  Result Value Ref Range Status   Specimen Description   Final    PLEURAL Performed at [facility], [address], [place], [place] [address]    Special Requests   Final    NONE Performed at [facility], [address] [place], [place] [address]    Gram Stain   Final    WBC PRESENT, PREDOMINANTLY PMN NO ORGANISMS SEEN CYTOSPIN SMEAR    Culture   Final    NO GROWTH < 24 HOURS Performed at [facility], [address] [place], [place] [address]    Report Status PENDING  Incomplete     Labs: Basic Metabolic Panel: Recent Labs  Lab 04/17/22 0812 04/18/22 0523 04/19/22 0507  NA 135 138 136  K 4.2 3.8 4.2  CL 104 103 101  CO2 GLUCOSE 143* 140* 144*  BUN 22 35* 31*  CREATININE 1.08 1.25* 1.19  CALCIUM 8.9 8.8* 8.5*   Liver Function Tests: Recent Labs  Lab 04/17/22 0812  AST 22  ALT 19  ALKPHOS 51  BILITOT 1.1  PROT 7.7  ALBUMIN 3.9   No results for input(s): LIPASE, AMYLASE in the last 168 hours. No results for input(s): AMMONIA in the last 168 hours. CBC: Recent Labs  Lab 04/17/22 0812 04/18/22 0523  WBC 12.3* 11.2*   HGB 13.2 12.8*  HCT 40.7 39.1  MCV 94.4 95.1  PLT 230 241   Cardiac Enzymes: No results for input(s): CKTOTAL, CKMB, CKMBINDEX, TROPONINI in the last 168 hours. BNP: BNP (last 3 results) Recent Labs    01/31/22 0508 04/17/22 0812  BNP 107.8* 69.2    ProBNP (last 3 results) Recent Labs    01/30/22 1344  PROBNP 109.0*    CBG: Recent Labs  Lab 04/18/22 0710 04/18/22 1149 04/18/22 1615 04/18/22 2007 04/19/22 0812  GLUCAP 137* 172* 171* 180* 144*       Signed:  Silvano Bilis MD.  [facility] 04/19/2022, 10:26 AM

## 2022-04-19 NOTE — Progress Notes (Signed)
Nsg Discharge Note  Admit Date:  04/17/2022 Discharge date: 04/19/2022   Adam Jennings to be D/C'd Home per MD order.  AVS completed.  . Patient/caregiver able to verbalize understanding.  Discharge Medication: Allergies as of 04/19/2022   No Known Allergies      Medication List     STOP taking these medications    meloxicam 15 MG tablet Commonly known as: MOBIC       TAKE these medications    amiodarone 200 MG tablet Commonly known as: PACERONE Take 1 tablet (200 mg total) by mouth daily.   apixaban 5 MG Tabs tablet Commonly known as: ELIQUIS Take 1 tablet (5 mg total) by mouth 2 (two) times daily.   aspirin 81 MG chewable tablet Chew 1 tablet (81 mg total) by mouth daily. Start taking on: Apr 20, 2022   dapagliflozin propanediol 10 MG Tabs tablet Commonly known as: Farxiga Take 1 tablet (10 mg total) by mouth daily before breakfast.   Entresto 24-26 MG Generic drug: sacubitril-valsartan Take 1 tablet by mouth 2 (two) times daily.   finasteride 5 MG tablet Commonly known as: PROSCAR TAKE 1 TABLET (5 MG TOTAL) BY MOUTH DAILY.   Fish Oil 1000 MG Caps Take 1,000 mg by mouth daily.   furosemide 20 MG tablet Commonly known as: LASIX TAKE 1 TABLET BY MOUTH EVERY DAY   metFORMIN 500 MG 24 hr tablet Commonly known as: GLUCOPHAGE-XR TAKE 1 TABLET (500 MG TOTAL) BY MOUTH DAILY WITH BREAKFAST. FOR DIABETES.   metoprolol succinate 25 MG 24 hr tablet Commonly known as: TOPROL-XL Take 0.5 tablets (12.5 mg total) by mouth daily.   rosuvastatin 20 MG tablet Commonly known as: CRESTOR Take 1 tablet (20 mg total) by mouth daily. For cholesterol. Have patient call the office!        Discharge Assessment: Vitals:   04/19/22 0716 04/19/22 1111  BP: 115/62 101/60  Pulse: 75 71  Resp: 17 18  Temp: 98.1 F (36.7 C) 97.6 F (36.4 C)  SpO2: 95% 93%   Skin clean, dry and intact without evidence of skin break down, no evidence of skin tears noted. IV catheter  discontinued intact. Site without signs and symptoms of complications - no redness or edema noted at insertion site, patient denies c/o pain - only slight tenderness at site.  Dressing with slight pressure applied.  D/c Instructions-Education: Discharge instructions given to patient/family with verbalized understanding. D/c education completed with patient/family including follow up instructions, medication list, d/c activities limitations if indicated, with other d/c instructions as indicated by MD - patient able to verbalize understanding, all questions fully answered. Patient instructed to return to ED, call 911, or call MD for any changes in condition.  Patient escorted via [place], and D/C home via private auto.  Adam Jennings, Adam Schimke, RN 04/19/2022 12:18 PM

## 2022-04-20 LAB — BODY FLUID CULTURE W GRAM STAIN: Culture: NO GROWTH

## 2022-04-21 LAB — LIPOPROTEIN A (LPA): Lipoprotein (a): 15.8 nmol/L (ref <75.0)

## 2022-04-21 LAB — TRIGLYCERIDES, BODY FLUIDS: Triglycerides, Fluid: 30 mg/dL

## 2022-04-22 ENCOUNTER — Telehealth: Payer: Self-pay

## 2022-04-22 LAB — ACID FAST SMEAR (AFB, MYCOBACTERIA): Acid Fast Smear: NEGATIVE

## 2022-04-22 NOTE — Telephone Encounter (Signed)
Transition Care Management Unsuccessful Follow-up Telephone Call  Date of discharge and from where:  TCM DC [facility] 04-19-22 Dx: Pleural effusion on right   Attempts:  1st Attempt  Reason for unsuccessful TCM follow-up call:  Left voice message  Transition Care Management Follow-up Telephone Call Date of discharge and from where: TCM DC [facility] 04-19-22 Dx: Pleural effusion on right  How have you been since you were released from the hospital? Doing good  Any questions or concerns? No  Items Reviewed: Did the pt receive and understand the discharge instructions provided? Yes  Medications obtained and verified? Yes  Other? No  Any new allergies since your discharge? No  Dietary orders reviewed? Yes Do you have support at home? Yes   Home Care and Equipment/Supplies: Were home health services ordered? no If so, what is the name of the agency? na  Has the agency set up a time to come to the patient's home? no Were any new equipment or medical supplies ordered?  No What is the name of the medical supply agency? na Were you able to get the supplies/equipment? not applicable Do you have any questions related to the use of the equipment or supplies? No  Functional Questionnaire: (I = Independent and D = Dependent) ADLs: I  Bathing/Dressing- I  Meal Prep- I  Eating- I  Maintaining continence- I  Transferring/Ambulation- I  Managing Meds- D  Follow up appointments reviewed:  PCP Hospital f/u appt confirmed? Yes  Scheduled to see Dr Chestine Spore on 04-30-22 @ noon . Specialist Hospital f/u appt confirmed? Yes  Scheduled to see  Dr Marco Collie on 04-24-22 @ 945am Are transportation arrangements needed? No  If their condition worsens, is the pt aware to call PCP or go to the Emergency Dept.? Yes Was the patient provided with contact information for the PCP's office or ED? Yes Was to pt encouraged to call back with questions or concerns? Yes

## 2022-04-24 ENCOUNTER — Ambulatory Visit: Payer: Medicare HMO | Admitting: Family

## 2022-04-24 DIAGNOSIS — I480 Paroxysmal atrial fibrillation: Secondary | ICD-10-CM | POA: Diagnosis not present

## 2022-04-24 DIAGNOSIS — I10 Essential (primary) hypertension: Secondary | ICD-10-CM | POA: Diagnosis not present

## 2022-04-24 DIAGNOSIS — I2510 Atherosclerotic heart disease of native coronary artery without angina pectoris: Secondary | ICD-10-CM | POA: Diagnosis not present

## 2022-04-24 DIAGNOSIS — I5020 Unspecified systolic (congestive) heart failure: Secondary | ICD-10-CM | POA: Diagnosis not present

## 2022-04-24 LAB — CHOLESTEROL, BODY FLUID: Cholesterol, Fluid: 98 mg/dL

## 2022-04-30 ENCOUNTER — Ambulatory Visit: Payer: Medicare HMO | Admitting: Primary Care

## 2022-05-08 DIAGNOSIS — I517 Cardiomegaly: Secondary | ICD-10-CM | POA: Diagnosis not present

## 2022-05-08 DIAGNOSIS — R0602 Shortness of breath: Secondary | ICD-10-CM | POA: Diagnosis not present

## 2022-05-08 DIAGNOSIS — J984 Other disorders of lung: Secondary | ICD-10-CM | POA: Diagnosis not present

## 2022-05-08 DIAGNOSIS — R918 Other nonspecific abnormal finding of lung field: Secondary | ICD-10-CM | POA: Diagnosis not present

## 2022-05-08 DIAGNOSIS — J90 Pleural effusion, not elsewhere classified: Secondary | ICD-10-CM | POA: Diagnosis not present

## 2022-05-21 ENCOUNTER — Telehealth: Payer: Self-pay | Admitting: Primary Care

## 2022-05-21 NOTE — Telephone Encounter (Signed)
Patient called back stating that he was returning call to the office. I do not see a note in the system.

## 2022-05-22 ENCOUNTER — Ambulatory Visit: Payer: Medicare HMO | Admitting: Primary Care

## 2022-05-22 ENCOUNTER — Ambulatory Visit (INDEPENDENT_AMBULATORY_CARE_PROVIDER_SITE_OTHER): Payer: Medicare HMO | Admitting: Primary Care

## 2022-05-22 VITALS — BP 110/70 | HR 58 | Temp 97.5°F | Ht 72.0 in | Wt 285.0 lb

## 2022-05-22 DIAGNOSIS — E1165 Type 2 diabetes mellitus with hyperglycemia: Secondary | ICD-10-CM | POA: Diagnosis not present

## 2022-05-22 DIAGNOSIS — J90 Pleural effusion, not elsewhere classified: Secondary | ICD-10-CM | POA: Diagnosis not present

## 2022-05-22 LAB — POCT GLYCOSYLATED HEMOGLOBIN (HGB A1C): Hemoglobin A1C: 6.3 % — AB (ref 4.0–5.6)

## 2022-05-22 NOTE — Progress Notes (Signed)
Subjective:    Patient ID: Adam Jennings, male    DOB: 22-Jun-1946, 76 y.o.   MRN: [id number]  HPI  Adam Jennings is a very pleasant 76 y.o. male with a history of hypertension, atrial fibrillation with RVR, CHF, type 2 diabetes, AKI, elevated PSA, hyperlipidemia who presents today for TCM hospital follow-up and follow up of diabetes.   1) CHF/CAD/Atrial Fibrillation:   Following with cardiology through [facility] clinic.  On 04/17/2022 he was admitted for increased dyspnea with shortness of breath, inability to lie flat in his bed, with right-sided chest discomfort that was worse with deep inspiration.  He admitted to not taking his diuretic for the last month.  He underwent chest x-ray which showed moderate right-sided pleural effusion, therefore, he was admitted for further evaluation.   During his hospital stay he underwent left heart cath on 04/18/2022 which showed moderate CAD, no PCI.  Pleural effusion cytology was negative, no obvious infection.  He did undergo repeat chest x-ray several days later which showed increased pleural effusion, however patient was without increased symptoms.  He was initiated on aspirin 81 mg daily and advised to continue all home medications, he was referred to pulmonology and cardiology for outpatient follow up. He was discharged home on 04/19/22.  Since his hospital stay he's followed up with his cardiologist, endorsed stable breathing and improved energy levels.  He was advised to start aspirin and continue all medications as prescribed.  Evaluated by pulmonology on 05/08/2022, recommendations were to continue diuretic.  There is no evidence of COPD/emphysema as evidenced by PFT's. He underwent chest x-ray for which we do not have records.  Today he's feeling better overall, shortness of breath will wax and wane. He is working four days weekly and is able to work without difficulty. He denies chest pain, dizziness. He is compliant to his aspirin, furosemide,  and all other medications.   BP Readings from Last 3 Encounters:  05/22/22 110/70  04/19/22 101/60  03/27/22 131/60     2) Type 2 Diabetes:   Current medications include: Metformin XR 500 mg daily  He is checking his blood glucose levels once weekly and doesn't recall the readings.   He denies numbness to his feet.   Last A1C: 7.8 in March 2023, 6.3 today! Last Eye Exam: Due Last Foot Exam: Due Pneumonia Vaccination: 2015? Urine Microalbumin: None. Managed on ARB Statin: Crestor  Dietary changes since last visit: He has changed his diet, eating more salads, reducing sodium, increased water intake.    Exercise: Active      Review of Systems  Respiratory:  Negative for cough and shortness of breath.   Cardiovascular:  Negative for chest pain and leg swelling.  Neurological:  Negative for dizziness and numbness.         Past Medical History:  Diagnosis Date   Arrhythmia    atrial fibrillation   BPH (benign prostatic hyperplasia)    CHF (congestive heart failure) (HCC)    Diabetes mellitus without complication (HCC)    Dizziness    Elevated blood pressure    Hernia of abdominal wall    Hypertension     Social History   Socioeconomic History   Marital status: Married    Spouse name: Not on file   Number of children: Not on file   Years of education: Not on file   Highest education level: Not on file  Occupational History   Not on file  Tobacco Use   Smoking  status: Never    Passive exposure: Never   Smokeless tobacco: Never  Vaping Use   Vaping Use: Never used  Substance and Sexual Activity   Alcohol use: No    Alcohol/week: 0.0 standard drinks of alcohol   Drug use: No   Sexual activity: Not Currently  Other Topics Concern   Not on file  Social History Narrative   Married.   1 daughter, 4 grandchildren.   Works in [employer] in [place].   Enjoys working on tractors.    Social Determinants of Health   Financial Resource Strain: Not on file   Food Insecurity: Not on file  Transportation Needs: Not on file  Physical Activity: Not on file  Stress: Not on file  Social Connections: Not on file  Intimate Partner Violence: Not on file    Past Surgical History:  Procedure Laterality Date   CATARACT EXTRACTION W/ INTRAOCULAR LENS IMPLANT Right 11/05/2017   CATARACT EXTRACTION W/ INTRAOCULAR LENS IMPLANT Left 12/08/2017   LEFT HEART CATH AND CORONARY ANGIOGRAPHY N/A 04/18/2022   Procedure: LEFT HEART CATH AND CORONARY ANGIOGRAPHY;  Surgeon: Armando Reichert, MD;  Location: [facility] INVASIVE CV LAB;  Service: Cardiovascular;  Laterality: N/A;    Family History  Problem Relation Age of Onset   Hypertension Father     No Known Allergies  Current Outpatient Medications on File Prior to Visit  Medication Sig Dispense Refill   amiodarone (PACERONE) 200 MG tablet Take 1 tablet (200 mg total) by mouth daily. 30 tablet 1   apixaban (ELIQUIS) 5 MG TABS tablet Take 1 tablet (5 mg total) by mouth 2 (two) times daily. 60 tablet 1   aspirin 81 MG chewable tablet Chew 1 tablet (81 mg total) by mouth daily. 90 tablet 1   dapagliflozin propanediol (FARXIGA) 10 MG TABS tablet Take 1 tablet (10 mg total) by mouth daily before breakfast. 30 tablet 5   finasteride (PROSCAR) 5 MG tablet TAKE 1 TABLET (5 MG TOTAL) BY MOUTH DAILY. 90 tablet 3   furosemide (LASIX) 20 MG tablet TAKE 1 TABLET BY MOUTH EVERY DAY 30 tablet 5   metFORMIN (GLUCOPHAGE-XR) 500 MG 24 hr tablet TAKE 1 TABLET (500 MG TOTAL) BY MOUTH DAILY WITH BREAKFAST. FOR DIABETES. 90 tablet 0   metoprolol succinate (TOPROL-XL) 25 MG 24 hr tablet Take 0.5 tablets (12.5 mg total) by mouth daily. 30 tablet 0   Omega-3 Fatty Acids (FISH OIL) 1000 MG CAPS Take 1,000 mg by mouth daily.     rosuvastatin (CRESTOR) 20 MG tablet Take 1 tablet (20 mg total) by mouth daily. For cholesterol. Have patient call the office! 90 tablet 0   sacubitril-valsartan (ENTRESTO) 24-26 MG Take 1 tablet by mouth 2 (two)  times daily.     No current facility-administered medications on file prior to visit.    BP 110/70   Pulse (!) 58   Temp (!) 97.5 F (36.4 C) (Oral)   Ht 6' (1.829 m)   Wt 285 lb (129.3 kg)   SpO2 93%   BMI 38.65 kg/m  Objective:   Physical Exam Cardiovascular:     Rate and Rhythm: Normal rate and regular rhythm.  Pulmonary:     Effort: Pulmonary effort is normal.     Breath sounds: Normal breath sounds. No wheezing or rales.  Musculoskeletal:     Cervical back: Neck supple.  Skin:    General: Skin is warm and dry.  Neurological:     Mental Status: He is alert and oriented  to person, place, and time.  Psychiatric:        Mood and Affect: Mood normal.           Assessment & Plan:   Problem List Items Addressed This Visit       Respiratory   Pleural effusion on right    Reviewed hospital notes from May 2023, cardiology notes through [facility] from June 2023 and pulmonology notes though [facility] from June 2023.  Lungs clear today. Seems to be doing well overall.  Continue furosemide 20 mg daily, discussed this with patient today,        Endocrine   Diabetes mellitus (HCC) - Primary   Relevant Orders   POCT glycosylated hemoglobin (Hb A1C) (Completed)       Doreene Nest, NP

## 2022-05-22 NOTE — Assessment & Plan Note (Signed)
Reviewed hospital notes from May 2023, cardiology notes through [facility] from June 2023 and pulmonology notes though [facility] from June 2023.  Lungs clear today. Seems to be doing well overall.  Continue furosemide 20 mg daily, discussed this with patient today,

## 2022-05-22 NOTE — Assessment & Plan Note (Signed)
Improved and controlled with A1C today of 6.3! Commended him on dietary changes.  Continue metformin XR 500 mg daily.  Foot exam today. Managed on statin and ARB. Pneumonia vaccine UTD. We will schedule him for an eye exam next month.  Follow up in 6 months

## 2022-05-22 NOTE — Patient Instructions (Addendum)
Your diabetes looks great!   Keep working on your diet and keep up the great work!  We will call you regarding the free eye exam.  Please schedule a physical to meet with me in January 2024.  It was a pleasure to see you today!

## 2022-05-22 NOTE — Telephone Encounter (Signed)
Reviewed chart no open message that I can see. No further action needed at this time.

## 2022-06-04 LAB — ACID FAST CULTURE WITH REFLEXED SENSITIVITIES (MYCOBACTERIA): Acid Fast Culture: NEGATIVE

## 2022-06-05 ENCOUNTER — Encounter: Payer: Self-pay | Admitting: Urology

## 2022-06-05 ENCOUNTER — Ambulatory Visit: Payer: Medicare HMO | Admitting: Urology

## 2022-06-05 VITALS — BP 113/67 | HR 58 | Ht 72.0 in | Wt 287.5 lb

## 2022-06-05 DIAGNOSIS — N138 Other obstructive and reflux uropathy: Secondary | ICD-10-CM | POA: Diagnosis not present

## 2022-06-05 DIAGNOSIS — R351 Nocturia: Secondary | ICD-10-CM

## 2022-06-05 DIAGNOSIS — N401 Enlarged prostate with lower urinary tract symptoms: Secondary | ICD-10-CM | POA: Diagnosis not present

## 2022-06-05 DIAGNOSIS — R310 Gross hematuria: Secondary | ICD-10-CM | POA: Diagnosis not present

## 2022-06-05 LAB — BLADDER SCAN AMB NON-IMAGING

## 2022-06-05 NOTE — Progress Notes (Signed)
   06/05/2022 10:22 AM   Adam Jennings 07-14-1946 [id number]  Reason for visit: Follow up BPH, nocturia, history of gross hematuria, PSA screening  HPI: 76 year old male with a number of comorbidities including morbid obesity who is primary urinary complaints was nocturia 5-7 times per night, as well as a history of some mildly elevated PVRs in the past.  He has a history of a elevated PSA of 7 in 2018 with a negative prostate biopsy, and prostate measured 250 g at that time.  Cystoscopy in January 2022 showed a massive prostate with obstructing lateral lobes and friable tissue but no suspicious bladder lesions.  In January 2022 he was started on finasteride which has made a remarkable improvement in his urinary symptoms.  His PVRs have normalized(62ml today) and his nocturia has improved to only once per night.  He denies any urinary complaints today and is delighted with urinary symptoms at this time.  I previously had recommended considering a sleep apnea evaluation with his significant nocturia.  At our last visit in February 2023 he has had a single episode of gross hematuria that resolved spontaneously, urinalysis was benign at that visit, but I recommended a CT urogram for further evaluation.  This was never completed.  He denies any hematuria since that time.  He would like to defer CT and understands the risk of missing additional pathology.  Return precautions were discussed including gross hematuria, UTIs, or worsening urinary symptoms.  Continue finasteride, refilled No further PSA screening needed per guidelines, especially in the setting of his comorbidities RTC 1 year PVR   Sondra Come, MD  [facility] [address], [address] [place], [place] [address] [phone]

## 2022-06-10 ENCOUNTER — Telehealth: Payer: Self-pay | Admitting: Primary Care

## 2022-06-10 NOTE — Telephone Encounter (Signed)
Left message for patient to call back and schedule Medicare Annual Wellness Visit (AWV) either virtually or phone   Last AWV ;03/14/19   I left my direct # [phone]

## 2022-06-22 ENCOUNTER — Other Ambulatory Visit: Payer: Self-pay | Admitting: Primary Care

## 2022-06-22 DIAGNOSIS — E1165 Type 2 diabetes mellitus with hyperglycemia: Secondary | ICD-10-CM

## 2022-07-23 DIAGNOSIS — E119 Type 2 diabetes mellitus without complications: Secondary | ICD-10-CM | POA: Diagnosis not present

## 2022-07-23 DIAGNOSIS — I5020 Unspecified systolic (congestive) heart failure: Secondary | ICD-10-CM | POA: Diagnosis not present

## 2022-07-23 DIAGNOSIS — I2510 Atherosclerotic heart disease of native coronary artery without angina pectoris: Secondary | ICD-10-CM | POA: Diagnosis not present

## 2022-07-23 DIAGNOSIS — I10 Essential (primary) hypertension: Secondary | ICD-10-CM | POA: Diagnosis not present

## 2022-07-23 DIAGNOSIS — I480 Paroxysmal atrial fibrillation: Secondary | ICD-10-CM | POA: Diagnosis not present

## 2022-08-07 DIAGNOSIS — Z79899 Other long term (current) drug therapy: Secondary | ICD-10-CM | POA: Diagnosis not present

## 2022-08-22 ENCOUNTER — Other Ambulatory Visit: Payer: Self-pay | Admitting: Family

## 2022-09-10 DIAGNOSIS — E039 Hypothyroidism, unspecified: Secondary | ICD-10-CM | POA: Diagnosis not present

## 2022-09-10 DIAGNOSIS — R911 Solitary pulmonary nodule: Secondary | ICD-10-CM | POA: Diagnosis not present

## 2022-09-12 ENCOUNTER — Other Ambulatory Visit: Payer: Self-pay | Admitting: Pulmonary Disease

## 2022-09-12 DIAGNOSIS — R911 Solitary pulmonary nodule: Secondary | ICD-10-CM

## 2022-09-15 ENCOUNTER — Other Ambulatory Visit: Payer: Self-pay | Admitting: Family

## 2022-09-15 DIAGNOSIS — I5022 Chronic systolic (congestive) heart failure: Secondary | ICD-10-CM

## 2022-09-22 ENCOUNTER — Other Ambulatory Visit: Payer: Self-pay | Admitting: Family

## 2022-09-22 DIAGNOSIS — I5022 Chronic systolic (congestive) heart failure: Secondary | ICD-10-CM

## 2022-09-23 ENCOUNTER — Telehealth: Payer: Self-pay | Admitting: Primary Care

## 2022-09-23 NOTE — Telephone Encounter (Signed)
Left message for patient to call back and schedule Medicare Annual Wellness Visit (AWV).   Please offer to do virtually or by telephone.   Last AWV: 03/14/2019  Please schedule at anytime with [facility] Advisor schedule   45 minute appointent  If any questions, please contact me at [phone]

## 2022-09-24 DIAGNOSIS — E039 Hypothyroidism, unspecified: Secondary | ICD-10-CM | POA: Diagnosis not present

## 2022-09-30 ENCOUNTER — Other Ambulatory Visit: Payer: Self-pay | Admitting: Family

## 2022-09-30 DIAGNOSIS — I5022 Chronic systolic (congestive) heart failure: Secondary | ICD-10-CM

## 2022-10-02 ENCOUNTER — Ambulatory Visit
Admission: RE | Admit: 2022-10-02 | Discharge: 2022-10-02 | Disposition: A | Discharge status: Home / Self Care | Payer: Medicare HMO | Source: Ambulatory Visit | Attending: Pulmonary Disease | Admitting: Pulmonary Disease

## 2022-10-02 DIAGNOSIS — R911 Solitary pulmonary nodule: Secondary | ICD-10-CM

## 2022-10-02 DIAGNOSIS — J90 Pleural effusion, not elsewhere classified: Secondary | ICD-10-CM | POA: Diagnosis not present

## 2022-10-02 DIAGNOSIS — J9811 Atelectasis: Secondary | ICD-10-CM | POA: Diagnosis not present

## 2022-10-02 DIAGNOSIS — I700 Atherosclerosis of aorta: Secondary | ICD-10-CM | POA: Diagnosis not present

## 2022-10-02 DIAGNOSIS — R918 Other nonspecific abnormal finding of lung field: Secondary | ICD-10-CM | POA: Diagnosis not present

## 2022-10-02 MED ORDER — IOPAMIDOL (ISOVUE-300) INJECTION 61%
75.0000 mL | Freq: Once | INTRAVENOUS | Status: AC | PRN
  Administered 2022-10-02: 75 mL via INTRAVENOUS

## 2022-10-08 ENCOUNTER — Other Ambulatory Visit: Payer: Self-pay | Admitting: Pulmonary Disease

## 2022-10-08 DIAGNOSIS — J90 Pleural effusion, not elsewhere classified: Secondary | ICD-10-CM

## 2022-10-14 ENCOUNTER — Other Ambulatory Visit: Payer: Self-pay | Admitting: Pulmonary Disease

## 2022-10-14 ENCOUNTER — Ambulatory Visit
Admission: RE | Admit: 2022-10-14 | Discharge: 2022-10-14 | Disposition: A | Discharge status: Home / Self Care | Payer: Medicare HMO | Source: Ambulatory Visit | Attending: Student | Admitting: Student

## 2022-10-14 ENCOUNTER — Ambulatory Visit
Admission: RE | Admit: 2022-10-14 | Discharge: 2022-10-14 | Disposition: A | Discharge status: Home / Self Care | Payer: Medicare HMO | Source: Ambulatory Visit | Attending: Pulmonary Disease | Admitting: Pulmonary Disease

## 2022-10-14 DIAGNOSIS — J90 Pleural effusion, not elsewhere classified: Secondary | ICD-10-CM | POA: Insufficient documentation

## 2022-10-14 LAB — BODY FLUID CELL COUNT WITH DIFFERENTIAL
Eos, Fluid: 0 %
Lymphs, Fluid: 89 %
Monocyte-Macrophage-Serous Fluid: 2 %
Neutrophil Count, Fluid: 9 %
Total Nucleated Cell Count, Fluid: 6087 cu mm

## 2022-10-14 LAB — AMYLASE, PLEURAL OR PERITONEAL FLUID: Amylase, Fluid: 18 U/L

## 2022-10-14 LAB — PROTEIN, PLEURAL OR PERITONEAL FLUID: Total protein, fluid: 5.4 g/dL

## 2022-10-14 LAB — LACTATE DEHYDROGENASE, PLEURAL OR PERITONEAL FLUID: LD, Fluid: 109 U/L — ABNORMAL HIGH (ref 3–23)

## 2022-10-14 LAB — GLUCOSE, PLEURAL OR PERITONEAL FLUID: Glucose, Fluid: 136 mg/dL

## 2022-10-14 MED ORDER — LIDOCAINE HCL (PF) 1 % IJ SOLN
5.0000 mL | Freq: Once | INTRAMUSCULAR | Status: AC
  Administered 2022-10-14: 5 mL via INTRADERMAL
  Filled 2022-10-14: qty 5

## 2022-10-14 NOTE — Procedures (Signed)
PROCEDURE SUMMARY:  Successful image-guided right thoracentesis. Yielded 1.6L of amber fluid. Pt tolerated procedure well. No immediate complications. EBL = trace   Specimen was sent for labs. CXR ordered.  Please see imaging section of Epic for full dictation.  Kennieth Francois PA-C 10/14/2022 10:03 AM

## 2022-10-15 LAB — ACID FAST SMEAR (AFB, MYCOBACTERIA): Acid Fast Smear: NEGATIVE

## 2022-10-15 LAB — CYTOLOGY - NON PAP

## 2022-10-17 LAB — BODY FLUID CULTURE W GRAM STAIN
Culture: NO GROWTH
Gram Stain: NONE SEEN

## 2022-10-17 LAB — CHOLESTEROL, BODY FLUID: Cholesterol, Fluid: 84 mg/dL

## 2022-10-21 ENCOUNTER — Other Ambulatory Visit: Payer: Self-pay | Admitting: Obstetrics and Gynecology

## 2022-10-23 LAB — MISC LABCORP TEST (SEND OUT): Labcorp test code: 9985

## 2022-10-30 DIAGNOSIS — E039 Hypothyroidism, unspecified: Secondary | ICD-10-CM | POA: Diagnosis not present

## 2022-11-06 DIAGNOSIS — R9389 Abnormal findings on diagnostic imaging of other specified body structures: Secondary | ICD-10-CM | POA: Diagnosis not present

## 2022-11-15 ENCOUNTER — Other Ambulatory Visit: Payer: Self-pay | Admitting: Urology

## 2022-11-19 LAB — FUNGUS CULTURE RESULT

## 2022-11-19 LAB — FUNGUS CULTURE WITH STAIN

## 2022-11-19 LAB — FUNGAL ORGANISM REFLEX

## 2022-11-20 ENCOUNTER — Other Ambulatory Visit: Payer: Self-pay | Admitting: Family

## 2022-11-27 LAB — ACID FAST CULTURE WITH REFLEXED SENSITIVITIES (MYCOBACTERIA): Acid Fast Culture: NEGATIVE

## 2022-12-10 DIAGNOSIS — J810 Acute pulmonary edema: Secondary | ICD-10-CM | POA: Diagnosis not present

## 2022-12-10 DIAGNOSIS — I5020 Unspecified systolic (congestive) heart failure: Secondary | ICD-10-CM | POA: Diagnosis not present

## 2022-12-10 DIAGNOSIS — E119 Type 2 diabetes mellitus without complications: Secondary | ICD-10-CM | POA: Diagnosis not present

## 2022-12-10 DIAGNOSIS — I480 Paroxysmal atrial fibrillation: Secondary | ICD-10-CM | POA: Diagnosis not present

## 2022-12-10 DIAGNOSIS — I10 Essential (primary) hypertension: Secondary | ICD-10-CM | POA: Diagnosis not present

## 2022-12-10 DIAGNOSIS — I2510 Atherosclerotic heart disease of native coronary artery without angina pectoris: Secondary | ICD-10-CM | POA: Diagnosis not present

## 2022-12-11 ENCOUNTER — Encounter: Payer: Medicare HMO | Admitting: Primary Care

## 2022-12-16 ENCOUNTER — Other Ambulatory Visit: Payer: Self-pay | Admitting: Primary Care

## 2022-12-16 DIAGNOSIS — E1165 Type 2 diabetes mellitus with hyperglycemia: Secondary | ICD-10-CM

## 2022-12-18 ENCOUNTER — Ambulatory Visit (INDEPENDENT_AMBULATORY_CARE_PROVIDER_SITE_OTHER): Payer: Medicare HMO | Admitting: Primary Care

## 2022-12-18 ENCOUNTER — Encounter: Payer: Self-pay | Admitting: Primary Care

## 2022-12-18 VITALS — BP 110/68 | HR 59 | Temp 97.2°F | Ht 72.0 in | Wt 290.0 lb

## 2022-12-18 DIAGNOSIS — R351 Nocturia: Secondary | ICD-10-CM | POA: Diagnosis not present

## 2022-12-18 DIAGNOSIS — Z125 Encounter for screening for malignant neoplasm of prostate: Secondary | ICD-10-CM

## 2022-12-18 DIAGNOSIS — E1165 Type 2 diabetes mellitus with hyperglycemia: Secondary | ICD-10-CM

## 2022-12-18 DIAGNOSIS — E039 Hypothyroidism, unspecified: Secondary | ICD-10-CM | POA: Diagnosis not present

## 2022-12-18 DIAGNOSIS — Z0000 Encounter for general adult medical examination without abnormal findings: Secondary | ICD-10-CM | POA: Diagnosis not present

## 2022-12-18 DIAGNOSIS — I5022 Chronic systolic (congestive) heart failure: Secondary | ICD-10-CM | POA: Diagnosis not present

## 2022-12-18 DIAGNOSIS — I4891 Unspecified atrial fibrillation: Secondary | ICD-10-CM

## 2022-12-18 DIAGNOSIS — E785 Hyperlipidemia, unspecified: Secondary | ICD-10-CM | POA: Diagnosis not present

## 2022-12-18 DIAGNOSIS — I5021 Acute systolic (congestive) heart failure: Secondary | ICD-10-CM | POA: Diagnosis not present

## 2022-12-18 DIAGNOSIS — I10 Essential (primary) hypertension: Secondary | ICD-10-CM

## 2022-12-18 LAB — COMPREHENSIVE METABOLIC PANEL
ALT: 22 U/L (ref 0–53)
AST: 22 U/L (ref 0–37)
Albumin: 4.5 g/dL (ref 3.5–5.2)
Alkaline Phosphatase: 48 U/L (ref 39–117)
BUN: 32 mg/dL — ABNORMAL HIGH (ref 6–23)
CO2: 28 mEq/L (ref 19–32)
Calcium: 9.2 mg/dL (ref 8.4–10.5)
Chloride: 104 mEq/L (ref 96–112)
Creatinine, Ser: 1.28 mg/dL (ref 0.40–1.50)
GFR: 54.93 mL/min — ABNORMAL LOW (ref >60.00)
Glucose, Bld: 123 mg/dL — ABNORMAL HIGH (ref 70–99)
Potassium: 4.4 mEq/L (ref 3.5–5.1)
Sodium: 140 mEq/L (ref 135–145)
Total Bilirubin: 0.6 mg/dL (ref 0.2–1.2)
Total Protein: 7.6 g/dL (ref 6.0–8.3)

## 2022-12-18 LAB — MICROALBUMIN / CREATININE URINE RATIO
Creatinine,U: 26.7 mg/dL
Microalb Creat Ratio: 2.6 mg/g (ref 0.0–30.0)
Microalb, Ur: <0.7 mg/dL (ref 0.0–1.9)

## 2022-12-18 LAB — PSA, MEDICARE: PSA: 2.98 ng/ml (ref 0.10–4.00)

## 2022-12-18 LAB — LIPID PANEL
Cholesterol: 173 mg/dL (ref 0–200)
HDL: 36.1 mg/dL — ABNORMAL LOW (ref >39.00)
LDL Cholesterol: 120 mg/dL — ABNORMAL HIGH (ref 0–99)
NonHDL: 136.4
Total CHOL/HDL Ratio: 5
Triglycerides: 84 mg/dL (ref 0.0–149.0)
VLDL: 16.8 mg/dL (ref 0.0–40.0)

## 2022-12-18 LAB — HEMOGLOBIN A1C: Hgb A1c MFr Bld: 6.3 % (ref 4.6–6.5)

## 2022-12-18 NOTE — Assessment & Plan Note (Signed)
Appears euvolemic today.  Following with cardiology, office notes reviewed from January 2024 through [facility].  Continue Farxiga 10 mg daily, Entresto, metoprolol succinate 12.5 mg daily.  

## 2022-12-18 NOTE — Assessment & Plan Note (Addendum)
Continue rosuvastatin 20 mg daily. Repeat lipid panel pending.  LDL goal of <70

## 2022-12-18 NOTE — Assessment & Plan Note (Signed)
Controlled.  Continue finasteride 5 mg daily. Repeat PSA level pending.

## 2022-12-18 NOTE — Assessment & Plan Note (Signed)
Controlled.  Continue Entresto 24-26 mg daily, metoprolol succinate 12.5 mg daily.

## 2022-12-18 NOTE — Assessment & Plan Note (Signed)
Rate and rhythm regular today. Continue Eliquis 5 mg BID, amiodarone 200 mg daily, and metoprolol succinate 12.5 mg daily.  Following with cardiology, office notes reviewed from January 2024 through [facility].

## 2022-12-18 NOTE — Progress Notes (Addendum)
Subjective:    Patient ID: Adam Jennings, male    DOB: May 08, 1946, 77 y.o.   MRN: [id number]  HPI  Adam Jennings is a very pleasant 77 y.o. male who presents today for complete physical and follow up of chronic conditions.  Immunizations: -Influenza: Declines -Shingles: Never completed -Pneumonia: Completed a vaccine in 2015, unsure which.  Diet: Fair diet. Improved diet.  Exercise: No regular exercise, active at work.   Eye exam: Completed years ago, discussed importance of annual follow up.  Dental exam: Completed years ago.  Colonoscopy: Completed > 10 years ago, declined last year.  PSA: Due  BP Readings from Last 3 Encounters:  12/18/22 110/68  10/14/22 106/62  06/05/22 113/67         Review of Systems  Constitutional:  Negative for unexpected weight change.  HENT:  Negative for rhinorrhea.   Respiratory:  Negative for cough and shortness of breath.   Cardiovascular:  Negative for chest pain.  Gastrointestinal:  Negative for constipation and diarrhea.  Genitourinary:  Negative for difficulty urinating.  Musculoskeletal:  Positive for arthralgias.  Skin:  Negative for rash.  Allergic/Immunologic: Negative for environmental allergies.  Neurological:  Negative for dizziness, numbness and headaches.  Psychiatric/Behavioral:  The patient is not nervous/anxious.          Past Medical History:  Diagnosis Date   Arrhythmia    atrial fibrillation   BPH (benign prostatic hyperplasia)    CHF (congestive heart failure) (HCC)    Diabetes mellitus without complication (HCC)    Dizziness    Elevated blood pressure    Hernia of abdominal wall    Hypertension     Social History   Socioeconomic History   Marital status: Married    Spouse name: Not on file   Number of children: Not on file   Years of education: Not on file   Highest education level: Not on file  Occupational History   Not on file  Tobacco Use   Smoking status: Never    Passive  exposure: Never   Smokeless tobacco: Never  Vaping Use   Vaping Use: Never used  Substance and Sexual Activity   Alcohol use: No    Alcohol/week: 0.0 standard drinks of alcohol   Drug use: No   Sexual activity: Not Currently  Other Topics Concern   Not on file  Social History Narrative   Married.   1 daughter, 4 grandchildren.   Works in [employer] in [place].   Enjoys working on tractors.    Social Determinants of Health   Financial Resource Strain: Not on file  Food Insecurity: Not on file  Transportation Needs: Not on file  Physical Activity: Not on file  Stress: Not on file  Social Connections: Not on file  Intimate Partner Violence: Not on file    Past Surgical History:  Procedure Laterality Date   CATARACT EXTRACTION W/ INTRAOCULAR LENS IMPLANT Right 11/05/2017   CATARACT EXTRACTION W/ INTRAOCULAR LENS IMPLANT Left 12/08/2017   LEFT HEART CATH AND CORONARY ANGIOGRAPHY N/A 04/18/2022   Procedure: LEFT HEART CATH AND CORONARY ANGIOGRAPHY;  Surgeon: Andrez Grime, MD;  Location: [place] [facility];  Service: Cardiovascular;  Laterality: N/A;    Family History  Problem Relation Age of Onset   Hypertension Father     No Known Allergies  Current Outpatient Medications on File Prior to Visit  Medication Sig Dispense Refill   amiodarone (PACERONE) 200 MG tablet Take 1 tablet (200 mg total)  by mouth daily. 30 tablet 1   apixaban (ELIQUIS) 5 MG TABS tablet Take 1 tablet (5 mg total) by mouth 2 (two) times daily. 60 tablet 1   aspirin 81 MG chewable tablet Chew 1 tablet (81 mg total) by mouth daily. 90 tablet 1   dapagliflozin propanediol (FARXIGA) 10 MG TABS tablet Take 1 tablet (10 mg total) by mouth daily before breakfast. 30 tablet 5   finasteride (PROSCAR) 5 MG tablet TAKE 1 TABLET (5 MG TOTAL) BY MOUTH DAILY. 90 tablet 3   furosemide (LASIX) 20 MG tablet TAKE 1 TABLET BY MOUTH EVERY DAY 30 tablet 0   metFORMIN (GLUCOPHAGE-XR) 500 MG 24 hr tablet TAKE 1 TABLET (500  MG TOTAL) BY MOUTH DAILY WITH BREAKFAST. FOR DIABETES. 90 tablet 1   metoprolol succinate (TOPROL-XL) 25 MG 24 hr tablet Take 0.5 tablets (12.5 mg total) by mouth daily. 30 tablet 0   Omega-3 Fatty Acids (FISH OIL) 1000 MG CAPS Take 1,000 mg by mouth daily.     rosuvastatin (CRESTOR) 20 MG tablet Take 1 tablet (20 mg total) by mouth daily. For cholesterol. Have patient call the office! 90 tablet 0   sacubitril-valsartan (ENTRESTO) 24-26 MG Take 1 tablet by mouth 2 (two) times daily.     No current facility-administered medications on file prior to visit.    BP 110/68   Pulse (!) 59   Temp (!) 97.2 F (36.2 C) (Temporal)   Ht 6' (1.829 m)   Wt 290 lb (131.5 kg)   SpO2 98%   BMI 39.33 kg/m  Objective:   Physical Exam HENT:     Right Ear: Tympanic membrane and ear canal normal.     Left Ear: Tympanic membrane and ear canal normal.     Nose: Nose normal.     Right Sinus: No maxillary sinus tenderness or frontal sinus tenderness.     Left Sinus: No maxillary sinus tenderness or frontal sinus tenderness.  Eyes:     Conjunctiva/sclera: Conjunctivae normal.  Neck:     Thyroid: No thyromegaly.     Vascular: No carotid bruit.  Cardiovascular:     Rate and Rhythm: Normal rate and regular rhythm.     Heart sounds: Normal heart sounds.  Pulmonary:     Effort: Pulmonary effort is normal.     Breath sounds: Normal breath sounds. No wheezing or rales.  Abdominal:     General: Bowel sounds are normal.     Palpations: Abdomen is soft.     Tenderness: There is no abdominal tenderness.  Musculoskeletal:        General: Normal range of motion.     Cervical back: Neck supple.  Skin:    General: Skin is warm and dry.  Neurological:     Mental Status: He is alert and oriented to person, place, and time.     Cranial Nerves: No cranial nerve deficit.     Deep Tendon Reflexes: Reflexes are normal and symmetric.  Psychiatric:        Mood and Affect: Mood normal.           Assessment &  Plan:  Preventative health care Assessment & Plan: Declines all immunizations including influenza, pneumonia, and shingles. PSA due and pending. Declines colonoscopy.  Discussed the importance of a healthy diet and regular exercise in order for weight loss, and to reduce the risk of further co-morbidity.  Exam stable. Labs pending.  Follow up in 1 year for repeat physical.    Acute  systolic CHF (congestive heart failure) (HCC)  Chronic systolic heart failure (HCC) Assessment & Plan: Appears euvolemic today.  Following with cardiology, office notes reviewed from January 2024 through [facility].  Continue Farxiga 10 mg daily, Entresto, metoprolol succinate 12.5 mg daily.   Essential hypertension Assessment & Plan: Controlled.  Continue Entresto 24-26 mg daily, metoprolol succinate 12.5 mg daily.    Hypothyroidism, unspecified type Assessment & Plan: Following with endocrinology, reviewed office notes from October 2023 through [facility]. Labs reviewed from December 2023 from [facility].  Continue levothyroxine, we do not have information on the dose, he will call back later with that information. Discussed that amiodarone can affect thyroid function.   Type 2 diabetes mellitus with hyperglycemia, without long-term current use of insulin (HCC) Assessment & Plan: Repeat A1C pending.  Continue metformin XR 500 mg daily. Urine microalbumin due and pending.  Follow up in 3-6 months based on A1C result.  Orders: -     Microalbumin / creatinine urine ratio -     Hemoglobin A1c  Hyperlipidemia, unspecified hyperlipidemia type Assessment & Plan: Continue rosuvastatin 20 mg daily. Repeat lipid panel pending.  LDL goal of <70  Orders: -     Lipid panel -     Comprehensive metabolic panel  Nocturia Assessment & Plan: Controlled.  Continue finasteride 5 mg daily. Repeat PSA level pending.   Screening for prostate cancer -     PSA,  Medicare  Atrial fibrillation with RVR (HCC) Assessment & Plan: Rate and rhythm regular today. Continue Eliquis 5 mg BID, amiodarone 200 mg daily, and metoprolol succinate 12.5 mg daily.  Following with cardiology, office notes reviewed from January 2024 through [facility].         Doreene Nest, NP

## 2022-12-18 NOTE — Assessment & Plan Note (Signed)
Declines all immunizations including influenza, pneumonia, and shingles. PSA due and pending. Declines colonoscopy.  Discussed the importance of a healthy diet and regular exercise in order for weight loss, and to reduce the risk of further co-morbidity.  Exam stable. Labs pending.  Follow up in 1 year for repeat physical.

## 2022-12-18 NOTE — Patient Instructions (Signed)
Stop by the lab prior to leaving today. I will notify you of your results once received.   Please call me with the name and dose of your thyroid pill.  Please schedule a follow up visit for 6 months for a diabetes check.  It was a pleasure to see you today!

## 2022-12-18 NOTE — Assessment & Plan Note (Deleted)
Appears euvolemic today.  Following with cardiology, office notes reviewed from January 2024 through [facility].  Continue Farxiga 10 mg daily, Entresto, metoprolol succinate 12.5 mg daily.

## 2022-12-18 NOTE — Assessment & Plan Note (Signed)
Following with endocrinology, reviewed office notes from October 2023 through [facility]. Labs reviewed from December 2023 from [facility].  Continue levothyroxine, we do not have information on the dose, he will call back later with that information. Discussed that amiodarone can affect thyroid function.

## 2022-12-18 NOTE — Assessment & Plan Note (Signed)
Repeat A1C pending.  Continue metformin XR 500 mg daily. Urine microalbumin due and pending.  Follow up in 3-6 months based on A1C result.

## 2022-12-19 ENCOUNTER — Other Ambulatory Visit: Payer: Self-pay

## 2022-12-22 ENCOUNTER — Telehealth: Payer: Self-pay | Admitting: Primary Care

## 2022-12-22 NOTE — Telephone Encounter (Signed)
LVM for pt informing him that we have to re-schedule his appt with nha on 01/13/23

## 2023-01-01 DIAGNOSIS — E039 Hypothyroidism, unspecified: Secondary | ICD-10-CM | POA: Diagnosis not present

## 2023-01-08 ENCOUNTER — Other Ambulatory Visit: Payer: Self-pay | Admitting: Family

## 2023-01-08 DIAGNOSIS — E039 Hypothyroidism, unspecified: Secondary | ICD-10-CM | POA: Diagnosis not present

## 2023-01-13 ENCOUNTER — Ambulatory Visit (INDEPENDENT_AMBULATORY_CARE_PROVIDER_SITE_OTHER): Payer: Medicare HMO

## 2023-01-13 VITALS — Ht 72.0 in | Wt 284.0 lb

## 2023-01-13 DIAGNOSIS — Z0000 Encounter for general adult medical examination without abnormal findings: Secondary | ICD-10-CM

## 2023-01-13 NOTE — Progress Notes (Signed)
I connected with  Alanmichael Spindel on 01/13/23 by a audio enabled telemedicine application and verified that I am speaking with the correct person using two identifiers.  Patient Location: Home  Provider Location: Office/Clinic  I discussed the limitations of evaluation and management by telemedicine. The patient expressed understanding and agreed to proceed.  Subjective:   Adam Jennings is a 77 y.o. male who presents for Medicare Annual/Subsequent preventive examination.  Review of Systems    Cardiac Risk Factors include: advanced age (>36mn, >>73women);diabetes mellitus;dyslipidemia;hypertension;male gender;obesity (BMI >30kg/m2)    Objective:    Today's Vitals   01/13/23 1518  Weight: 284 lb (128.8 kg)  Height: 6' (1.829 m)   Body mass index is 38.52 kg/m.     01/13/2023    3:29 PM 04/19/2022   11:12 AM 01/31/2022    8:29 AM 01/30/2022    4:43 PM 03/14/2019    2:35 PM 03/08/2018    8:15 AM 03/05/2017    1:18 PM  Advanced Directives  Does Patient Have a Medical Advance Directive? No No  No No No Yes  Type of Advance Directive       HEyotain Chart?       No - copy requested  Would patient like information on creating a medical advance directive? Yes (ED - Information included in AVS) No - Patient declined No - Patient declined  No - Patient declined Yes (MAU/Ambulatory/Procedural Areas - Information given)     Current Medications (verified) Outpatient Encounter Medications as of 01/13/2023  Medication Sig   amiodarone (PACERONE) 200 MG tablet Take 1 tablet (200 mg total) by mouth daily.   apixaban (ELIQUIS) 5 MG TABS tablet Take 1 tablet (5 mg total) by mouth 2 (two) times daily.   aspirin 81 MG chewable tablet Chew 1 tablet (81 mg total) by mouth daily.   dapagliflozin propanediol (FARXIGA) 10 MG TABS tablet Take 1 tablet (10 mg total) by mouth daily before breakfast.   finasteride (PROSCAR) 5 MG tablet TAKE 1  TABLET (5 MG TOTAL) BY MOUTH DAILY.   furosemide (LASIX) 20 MG tablet TAKE 1 TABLET BY MOUTH EVERY DAY   levothyroxine (SYNTHROID) 50 MCG tablet Take 50 mcg by mouth daily before breakfast.   metFORMIN (GLUCOPHAGE-XR) 500 MG 24 hr tablet TAKE 1 TABLET (500 MG TOTAL) BY MOUTH DAILY WITH BREAKFAST. FOR DIABETES.   metoprolol succinate (TOPROL-XL) 25 MG 24 hr tablet Take 0.5 tablets (12.5 mg total) by mouth daily.   Omega-3 Fatty Acids (FISH OIL) 1000 MG CAPS Take 1,000 mg by mouth daily.   rosuvastatin (CRESTOR) 20 MG tablet Take 1 tablet (20 mg total) by mouth daily. For cholesterol. Have patient call the office!   sacubitril-valsartan (ENTRESTO) 24-26 MG Take 1 tablet by mouth 2 (two) times daily.   No facility-administered encounter medications on file as of 01/13/2023.    Allergies (verified) Patient has no known allergies.   History: Past Medical History:  Diagnosis Date   Arrhythmia    atrial fibrillation   BPH (benign prostatic hyperplasia)    CHF (congestive heart failure) (HCC)    Diabetes mellitus without complication (HCC)    Dizziness    Elevated blood pressure    Hernia of abdominal wall    Hypertension    Past Surgical History:  Procedure Laterality Date   CATARACT EXTRACTION W/ INTRAOCULAR LENS IMPLANT Right 11/05/2017   CATARACT EXTRACTION W/ INTRAOCULAR LENS IMPLANT Left 12/08/2017  LEFT HEART CATH AND CORONARY ANGIOGRAPHY N/A 04/18/2022   Procedure: LEFT HEART CATH AND CORONARY ANGIOGRAPHY;  Surgeon: Andrez Grime, MD;  Location: [facility];  Service: Cardiovascular;  Laterality: N/A;   Family History  Problem Relation Age of Onset   Hypertension Father    Social History   Socioeconomic History   Marital status: Married    Spouse name: Not on file   Number of children: Not on file   Years of education: Not on file   Highest education level: Not on file  Occupational History   Not on file  Tobacco Use   Smoking status: Never    Passive  exposure: Never   Smokeless tobacco: Never  Vaping Use   Vaping Use: Never used  Substance and Sexual Activity   Alcohol use: No    Alcohol/week: 0.0 standard drinks of alcohol   Drug use: No   Sexual activity: Not Currently  Other Topics Concern   Not on file  Social History Narrative   Married.   1 daughter, 4 grandchildren.   Works in [employer] in [place].   Enjoys working on tractors.    Social Determinants of Health   Financial Resource Strain: Low Risk  (01/13/2023)   Overall Financial Resource Strain (CARDIA)    Difficulty of Paying Living Expenses: Not hard at all  Food Insecurity: No Food Insecurity (01/13/2023)   Hunger Vital Sign    Worried About Running Out of Food in the Last Year: Never true    Ran Out of Food in the Last Year: Never true  Transportation Needs: No Transportation Needs (01/13/2023)   PRAPARE - Hydrologist (Medical): No    Lack of Transportation (Non-Medical): No  Physical Activity: Sufficiently Active (01/13/2023)   Exercise Vital Sign    Days of Exercise per Week: 7 days    Minutes of Exercise per Session: 30 min  Stress: No Stress Concern Present (01/13/2023)   [place]    Feeling of Stress : Not at all  Social Connections: Moderately Integrated (01/13/2023)   Social Connection and Isolation Panel [NHANES]    Frequency of Communication with Friends and Family: Three times a week    Frequency of Social Gatherings with Friends and Family: Three times a week    Attends Religious Services: More than 4 times per year    Active Member of Clubs or Organizations: No    Attends Archivist Meetings: Never    Marital Status: Married    Tobacco Counseling Counseling given: Not Answered   Clinical Intake:  Pre-visit preparation completed: Yes  Pain : No/denies pain     BMI - recorded: 38.52 Nutritional Status: BMI > 30  Obese Nutritional Risks:  None Diabetes: Yes  How often do you need to have someone help you when you read instructions, pamphlets, or other written materials from your doctor or pharmacy?: 1 - Never  Diabetic?yes     Information entered by :: B.Shandria Clinch,LPN   Activities of Daily Living    01/13/2023    3:29 PM 04/19/2022   11:09 AM  In your present state of health, do you have any difficulty performing the following activities:  Hearing? 0   Vision? 0   Difficulty concentrating or making decisions? 0   Walking or climbing stairs? 0   Dressing or bathing? 0   Doing errands, shopping? 0 0  Preparing Food and eating ? N  Using the Toilet? N   In the past six months, have you accidently leaked urine? N   Do you have problems with loss of bowel control? N   Managing your Medications? N   Managing your Finances? N   Housekeeping or managing your Housekeeping? N     Patient Care Team: Pleas Koch, NP as PCP - General (Internal Medicine)  Indicate any recent Medical Services you may have received from other than [facility] providers in the past year (date may be approximate).     Assessment:   This is a routine wellness examination for Adam Jennings.  Hearing/Vision screen Hearing Screening - Comments:: Adequate hearing Vision Screening - Comments:: Adequate vision: [employer]  Dietary issues and exercise activities discussed: Current Exercise Habits: Home exercise routine, Type of exercise: walking, Time (Minutes): 45, Frequency (Times/Week): 5, Weekly Exercise (Minutes/Week): 225, Intensity: Mild, Exercise limited by: None identified   Goals Addressed             This Visit's Progress    Patient Stated   On track    Starting 03/14/19, I will continue to do strengthening exercises for 15 minutes daily.        Depression Screen    01/13/2023    3:22 PM 12/18/2022    8:20 AM 11/26/2021    2:18 PM 10/23/2020    8:11 AM 03/14/2019    2:35 PM 03/08/2018    8:15 AM 03/05/2017    1:18 PM   PHQ 2/9 Scores  PHQ - 2 Score 0 0 0 0 0 0 0  PHQ- 9 Score   0 0 0 0     Fall Risk    01/13/2023    3:21 PM 12/18/2022    8:20 AM 03/27/2022    8:33 AM 02/13/2022    8:58 AM 11/26/2021    2:19 PM  [place] in the past year? 0 0 0 0 0  Number falls in past yr: 0 0 0 0 0  Injury with Fall? 0 0 0 0 0  Risk for fall due to : No Fall Risks No Fall Risks     Follow up Education provided;Falls prevention discussed Falls evaluation completed Falls evaluation completed Falls evaluation completed     FALL RISK PREVENTION PERTAINING TO THE HOME:  Any stairs in or around the home? Yes  If so, are there any without handrails? Yes  Home free of loose throw rugs in walkways, pet beds, electrical cords, etc? Yes  Adequate lighting in your home to reduce risk of falls? Yes   ASSISTIVE DEVICES UTILIZED TO PREVENT FALLS:  Life alert? No  Use of a cane, walker or w/c? No  Grab bars in the bathroom? Yes  Shower chair or bench in shower? Yes  Elevated toilet seat or a handicapped toilet? Yes    Cognitive Function:    03/14/2019    3:14 PM 03/08/2018    8:31 AM 03/05/2017    1:22 PM  MMSE - Mini Mental State Exam  Orientation to time 5 5 5  $ Orientation to Place 5 5 5  $ Registration 3 3 3  $ Attention/ Calculation 0 0 0  Recall 3 2 3  $ Recall-comments  unable to recall 1 of 3 words   Language- name 2 objects 0 0 0  Language- repeat 1 1 1  $ Language- follow 3 step command 0 3 3  Language- read & follow direction 0 0 0  Write a sentence 0  0 0  Copy design 0 0 0  Total score 17 19 20        $ Immunizations Immunization History  Administered Date(s) Administered   Pneumococcal-Unspecified 11/24/2013    TDAP status: Due, Education has been provided regarding the importance of this vaccine. Advised may receive this vaccine at local pharmacy or Health Dept. Aware to provide a copy of the vaccination record if obtained from local pharmacy or Health Dept. Verbalized acceptance and  understanding.  Flu Vaccine status: Declined, Education has been provided regarding the importance of this vaccine but patient still declined. Advised may receive this vaccine at local pharmacy or Health Dept. Aware to provide a copy of the vaccination record if obtained from local pharmacy or Health Dept. Verbalized acceptance and understanding.  Pneumococcal vaccine status: Declined,  Education has been provided regarding the importance of this vaccine but patient still declined. Advised may receive this vaccine at local pharmacy or Health Dept. Aware to provide a copy of the vaccination record if obtained from local pharmacy or Health Dept. Verbalized acceptance and understanding.   Covid-19 vaccine status: Declined, Education has been provided regarding the importance of this vaccine but patient still declined. Advised may receive this vaccine at local pharmacy or Health Dept.or vaccine clinic. Aware to provide a copy of the vaccination record if obtained from local pharmacy or Health Dept. Verbalized acceptance and understanding.  Qualifies for Shingles Vaccine? Yes   Zostavax completed No   Shingrix Completed?: No.    Education has been provided regarding the importance of this vaccine. Patient has been advised to call insurance company to determine out of pocket expense if they have not yet received this vaccine. Advised may also receive vaccine at local pharmacy or Health Dept. Verbalized acceptance and understanding.  Screening Tests Health Maintenance  Topic Date Due   COVID-19 Vaccine (1) Never done   OPHTHALMOLOGY EXAM  10/19/2018   INFLUENZA VACCINE  02/22/2023 (Originally 06/24/2022)   Zoster Vaccines- Shingrix (1 of 2) 03/19/2023 (Originally 12/02/1964)   Pneumonia Vaccine 30+ Years old (1 of 1 - PCV) 12/19/2023 (Originally 12/02/2012)   COLONOSCOPY (Pts 45-24yr Insurance coverage will need to be confirmed)  03/06/2027 (Originally 11/24/2022)   FOOT EXAM  05/23/2023   HEMOGLOBIN A1C   06/18/2023   Diabetic kidney evaluation - eGFR measurement  12/19/2023   Diabetic kidney evaluation - Urine ACR  12/19/2023   Medicare Annual Wellness (AWV)  01/14/2024   Hepatitis C Screening  Completed   HPV VACCINES  Aged Out   DTaP/Tdap/Td  Discontinued    Health Maintenance  Health Maintenance Due  Topic Date Due   COVID-19 Vaccine (1) Never done   OPHTHALMOLOGY EXAM  10/19/2018    Colorectal cancer screening: No longer required.   Lung Cancer Screening: (Low Dose CT Chest recommended if Age 75-80years, 30 pack-year currently smoking OR have quit w/in 15years.) does not qualify.   Lung Cancer Screening Referral: no  Additional Screening:  Hepatitis C Screening: does not qualify; Completed no  Vision Screening: Recommended annual ophthalmology exams for early detection of glaucoma and other disorders of the eye. Is the patient up to date with their annual eye exam?  Yes  Who is the provider or what is the name of the office in which the patient attends annual eye exams? [facility] If pt is not established with a provider, would they like to be referred to a provider to establish care? No .   Dental Screening: Recommended annual dental exams for  proper oral hygiene  Community Resource Referral / Chronic Care Management: CRR required this visit?  No   CCM required this visit?  No      Plan:     I have personally reviewed and noted the following in the patient's chart:   Medical and social history Use of alcohol, tobacco or illicit drugs  Current medications and supplements including opioid prescriptions. Patient is not currently taking opioid prescriptions. Functional ability and status Nutritional status Physical activity Advanced directives List of other physicians Hospitalizations, surgeries, and ER visits in previous 12 months Vitals Screenings to include cognitive, depression, and falls Referrals and appointments  In addition, I have reviewed and  discussed with patient certain preventive protocols, quality metrics, and best practice recommendations. A written personalized care plan for preventive services as well as general preventive health recommendations were provided to patient.     Brix Shelter, LPN   [id number]   Nurse Notes: pt is doing well and has no concerns or questions during this visit. Pt declines all vaccines since he believes his sister passed from receiving them.

## 2023-01-13 NOTE — Patient Instructions (Signed)
[name] , Thank you for taking time to come for your Medicare Wellness Visit. I appreciate your ongoing commitment to your health goals. Please review the following plan we discussed and let me know if I can assist you in the future.   These are the goals we discussed:  Goals      Patient Stated     Starting 03/14/19, I will continue to do strengthening exercises for 15 minutes daily.         This is a list of the screening recommended for you and due dates:  Health Maintenance  Topic Date Due   COVID-19 Vaccine (1) Never done   Eye exam for diabetics  10/19/2018   Flu Shot  02/22/2023*   Zoster (Shingles) Vaccine (1 of 2) 03/19/2023*   Pneumonia Vaccine (1 of 1 - PCV) 12/19/2023*   Colon Cancer Screening  03/06/2027*   Complete foot exam   05/23/2023   Hemoglobin A1C  06/18/2023   Yearly kidney function blood test for diabetes  12/19/2023   Yearly kidney health urinalysis for diabetes  12/19/2023   Medicare Annual Wellness Visit  01/14/2024   Hepatitis C Screening: USPSTF Recommendation to screen - Ages 18-79 yo.  Completed   HPV Vaccine  Aged Out   DTaP/Tdap/Td vaccine  Discontinued  *Topic was postponed. The date shown is not the original due date.    Advanced directives: no daughter doing for him  Conditions/risks identified: none  Next appointment: Follow up in one year for your annual wellness visit. 01/19/2024 @3pm$  telephone  Preventive Care 65 Years and Older, Male  Preventive care refers to lifestyle choices and visits with your health care provider that can promote health and wellness. What does preventive care include? A yearly physical exam. This is also called an annual well check. Dental exams once or twice a year. Routine eye exams. Ask your health care provider how often you should have your eyes checked. Personal lifestyle choices, including: Daily care of your teeth and gums. Regular physical activity. Eating a healthy diet. Avoiding tobacco and  drug use. Limiting alcohol use. Practicing safe sex. Taking low doses of aspirin every day. Taking vitamin and mineral supplements as recommended by your health care provider. What happens during an annual well check? The services and screenings done by your health care provider during your annual well check will depend on your age, overall health, lifestyle risk factors, and family history of disease. Counseling  Your health care provider may ask you questions about your: Alcohol use. Tobacco use. Drug use. Emotional well-being. Home and relationship well-being. Sexual activity. Eating habits. History of falls. Memory and ability to understand (cognition). Work and work Statistician. Screening  You may have the following tests or measurements: Height, weight, and BMI. Blood pressure. Lipid and cholesterol levels. These may be checked every 5 years, or more frequently if you are over 7 years old. Skin check. Lung cancer screening. You may have this screening every year starting at age 40 if you have a 30-pack-year history of smoking and currently smoke or have quit within the past 15 years. Fecal occult blood test (FOBT) of the stool. You may have this test every year starting at age 20. Flexible sigmoidoscopy or colonoscopy. You may have a sigmoidoscopy every 5 years or a colonoscopy every 10 years starting at age 6. Prostate cancer screening. Recommendations will vary depending on your family history and other risks. Hepatitis C blood test. Hepatitis B blood test. Sexually transmitted disease (STD)  testing. Diabetes screening. This is done by checking your blood sugar (glucose) after you have not eaten for a while (fasting). You may have this done every 1-3 years. Abdominal aortic aneurysm (AAA) screening. You may need this if you are a current or former smoker. Osteoporosis. You may be screened starting at age 34 if you are at high risk. Talk with your health care provider  about your test results, treatment options, and if necessary, the need for more tests. Vaccines  Your health care provider may recommend certain vaccines, such as: Influenza vaccine. This is recommended every year. Tetanus, diphtheria, and acellular pertussis (Tdap, Td) vaccine. You may need a Td booster every 10 years. Zoster vaccine. You may need this after age 45. Pneumococcal 13-valent conjugate (PCV13) vaccine. One dose is recommended after age 52. Pneumococcal polysaccharide (PPSV23) vaccine. One dose is recommended after age 79. Talk to your health care provider about which screenings and vaccines you need and how often you need them. This information is not intended to replace advice given to you by your health care provider. Make sure you discuss any questions you have with your health care provider. Document Released: 12/07/2015 Document Revised: 07/30/2016 Document Reviewed: 09/11/2015 Elsevier Interactive Patient Education  2017 [place] Prevention in the Home Falls can cause injuries. They can happen to people of all ages. There are many things you can do to make your home safe and to help prevent falls. What can I do on the outside of my home? Regularly fix the edges of walkways and driveways and fix any cracks. Remove anything that might make you trip as you walk through a door, such as a raised step or threshold. Trim any bushes or trees on the path to your home. Use bright outdoor lighting. Clear any walking paths of anything that might make someone trip, such as rocks or tools. Regularly check to see if handrails are loose or broken. Make sure that both sides of any steps have handrails. Any raised decks and porches should have guardrails on the edges. Have any leaves, snow, or ice cleared regularly. Use sand or salt on walking paths during winter. Clean up any spills in your garage right away. This includes oil or grease spills. What can I do in the  bathroom? Use night lights. Install grab bars by the toilet and in the tub and shower. Do not use towel bars as grab bars. Use non-skid mats or decals in the tub or shower. If you need to sit down in the shower, use a plastic, non-slip stool. Keep the floor dry. Clean up any water that spills on the floor as soon as it happens. Remove soap buildup in the tub or shower regularly. Attach bath mats securely with double-sided non-slip rug tape. Do not have throw rugs and other things on the floor that can make you trip. What can I do in the bedroom? Use night lights. Make sure that you have a light by your bed that is easy to reach. Do not use any sheets or blankets that are too big for your bed. They should not hang down onto the floor. Have a firm chair that has side arms. You can use this for support while you get dressed. Do not have throw rugs and other things on the floor that can make you trip. What can I do in the kitchen? Clean up any spills right away. Avoid walking on wet floors. Keep items that you use a lot in  easy-to-reach places. If you need to reach something above you, use a strong step stool that has a grab bar. Keep electrical cords out of the way. Do not use floor polish or wax that makes floors slippery. If you must use wax, use non-skid floor wax. Do not have throw rugs and other things on the floor that can make you trip. What can I do with my stairs? Do not leave any items on the stairs. Make sure that there are handrails on both sides of the stairs and use them. Fix handrails that are broken or loose. Make sure that handrails are as long as the stairways. Check any carpeting to make sure that it is firmly attached to the stairs. Fix any carpet that is loose or worn. Avoid having throw rugs at the top or bottom of the stairs. If you do have throw rugs, attach them to the floor with carpet tape. Make sure that you have a light switch at the top of the stairs and the  bottom of the stairs. If you do not have them, ask someone to add them for you. What else can I do to help prevent falls? Wear shoes that: Do not have high heels. Have rubber bottoms. Are comfortable and fit you well. Are closed at the toe. Do not wear sandals. If you use a stepladder: Make sure that it is fully opened. Do not climb a closed stepladder. Make sure that both sides of the stepladder are locked into place. Ask someone to hold it for you, if possible. Clearly mark and make sure that you can see: Any grab bars or handrails. First and last steps. Where the edge of each step is. Use tools that help you move around (mobility aids) if they are needed. These include: Canes. Walkers. Scooters. Crutches. Turn on the lights when you go into a dark area. Replace any light bulbs as soon as they burn out. Set up your furniture so you have a clear path. Avoid moving your furniture around. If any of your floors are uneven, fix them. If there are any pets around you, be aware of where they are. Review your medicines with your doctor. Some medicines can make you feel dizzy. This can increase your chance of falling. Ask your doctor what other things that you can do to help prevent falls. This information is not intended to replace advice given to you by your health care provider. Make sure you discuss any questions you have with your health care provider. Document Released: 09/06/2009 Document Revised: 04/17/2016 Document Reviewed: 12/15/2014 Elsevier Interactive Patient Education  2017 [employer].

## 2023-02-04 ENCOUNTER — Other Ambulatory Visit: Payer: Self-pay | Admitting: Primary Care

## 2023-02-04 DIAGNOSIS — E785 Hyperlipidemia, unspecified: Secondary | ICD-10-CM

## 2023-02-19 ENCOUNTER — Other Ambulatory Visit (INDEPENDENT_AMBULATORY_CARE_PROVIDER_SITE_OTHER): Payer: Medicare HMO

## 2023-02-19 ENCOUNTER — Other Ambulatory Visit: Payer: Self-pay | Admitting: Primary Care

## 2023-02-19 DIAGNOSIS — E785 Hyperlipidemia, unspecified: Secondary | ICD-10-CM

## 2023-02-19 LAB — LIPID PANEL
Cholesterol: 155 mg/dL (ref 0–200)
HDL: 36.4 mg/dL — ABNORMAL LOW (ref >39.00)
LDL Cholesterol: 105 mg/dL — ABNORMAL HIGH (ref 0–99)
NonHDL: 118.55
Total CHOL/HDL Ratio: 4
Triglycerides: 70 mg/dL (ref 0.0–149.0)
VLDL: 14 mg/dL (ref 0.0–40.0)

## 2023-02-19 MED ORDER — ROSUVASTATIN CALCIUM 40 MG PO TABS: 40.0000 mg | ORAL_TABLET | Freq: Every day | ORAL | 2 refills | Status: DC

## 2023-03-12 DIAGNOSIS — E039 Hypothyroidism, unspecified: Secondary | ICD-10-CM | POA: Diagnosis not present

## 2023-05-07 DIAGNOSIS — E039 Hypothyroidism, unspecified: Secondary | ICD-10-CM | POA: Diagnosis not present

## 2023-05-07 DIAGNOSIS — R0602 Shortness of breath: Secondary | ICD-10-CM | POA: Diagnosis not present

## 2023-05-07 DIAGNOSIS — R918 Other nonspecific abnormal finding of lung field: Secondary | ICD-10-CM | POA: Diagnosis not present

## 2023-05-11 DIAGNOSIS — J948 Other specified pleural conditions: Secondary | ICD-10-CM | POA: Diagnosis not present

## 2023-05-11 DIAGNOSIS — E039 Hypothyroidism, unspecified: Secondary | ICD-10-CM | POA: Diagnosis not present

## 2023-06-01 DIAGNOSIS — E119 Type 2 diabetes mellitus without complications: Secondary | ICD-10-CM | POA: Diagnosis not present

## 2023-06-01 DIAGNOSIS — I10 Essential (primary) hypertension: Secondary | ICD-10-CM | POA: Diagnosis not present

## 2023-06-01 DIAGNOSIS — I480 Paroxysmal atrial fibrillation: Secondary | ICD-10-CM | POA: Diagnosis not present

## 2023-06-01 DIAGNOSIS — I2510 Atherosclerotic heart disease of native coronary artery without angina pectoris: Secondary | ICD-10-CM | POA: Diagnosis not present

## 2023-06-01 DIAGNOSIS — I5020 Unspecified systolic (congestive) heart failure: Secondary | ICD-10-CM | POA: Diagnosis not present

## 2023-06-11 ENCOUNTER — Ambulatory Visit: Payer: Medicare HMO | Admitting: Urology

## 2023-06-18 ENCOUNTER — Encounter: Payer: Self-pay | Admitting: Primary Care

## 2023-06-18 ENCOUNTER — Ambulatory Visit (INDEPENDENT_AMBULATORY_CARE_PROVIDER_SITE_OTHER): Payer: Medicare HMO | Admitting: Primary Care

## 2023-06-18 VITALS — BP 128/62 | HR 53 | Temp 97.3°F | Ht 72.0 in | Wt 293.0 lb

## 2023-06-18 DIAGNOSIS — Z7985 Long-term (current) use of injectable non-insulin antidiabetic drugs: Secondary | ICD-10-CM | POA: Diagnosis not present

## 2023-06-18 DIAGNOSIS — E1165 Type 2 diabetes mellitus with hyperglycemia: Secondary | ICD-10-CM | POA: Diagnosis not present

## 2023-06-18 LAB — POCT GLYCOSYLATED HEMOGLOBIN (HGB A1C): Hemoglobin A1C: 6.1 % — AB (ref 4.0–5.6)

## 2023-06-18 MED ORDER — OZEMPIC (0.25 OR 0.5 MG/DOSE) 2 MG/3ML ~~LOC~~ SOPN: PEN_INJECTOR | SUBCUTANEOUS | 0 refills | Status: DC

## 2023-06-18 NOTE — Progress Notes (Signed)
Subjective:    Patient ID: Adam Jennings, male    DOB: 23-Feb-1946, 77 y.o.   MRN: [id number]  HPI  Adam Jennings is a very pleasant 77 y.o. male with a history of hypertension, CHF, paroxysmal atrial fibrillation, type 2 diabetes, chronic knee pain, pleural effusion, hypothyroidism who presents today for follow-up of diabetes.  Current medications include: Metformin XR 500 mg daily, Farxiga 10 mg daily  He is checking his blood glucose 0 times daily.  Last A1C: 6.3 in January 2024, 6.1 today. Last Eye Exam: Due Last Foot Exam: Due Pneumonia Vaccination: 2015? Urine Microalbumin: Up-to-date Statin: Rosuvastatin  Dietary changes since last visit: He is working to cut back on sweets. He has increased water intake. His downfall is snacking, is trying to work on getting back down into the 280s.  His surgeon told him he needed to be below 280 pounds before he could proceed with the surgery.   Exercise: He is active at home, also works.   Wt Readings from Last 3 Encounters:  06/18/23 293 lb (132.9 kg)  01/13/23 284 lb (128.8 kg)  12/18/22 290 lb (131.5 kg)     Review of Systems  Respiratory:  Negative for shortness of breath.   Cardiovascular:  Negative for chest pain.  Neurological:  Negative for dizziness and numbness.         Past Medical History:  Diagnosis Date   Arrhythmia    atrial fibrillation   BPH (benign prostatic hyperplasia)    CHF (congestive heart failure) (HCC)    Diabetes mellitus without complication (HCC)    Dizziness    Elevated blood pressure    Hernia of abdominal wall    Hypertension     Social History   Socioeconomic History   Marital status: Married    Spouse name: Not on file   Number of children: Not on file   Years of education: Not on file   Highest education level: Not on file  Occupational History   Not on file  Tobacco Use   Smoking status: Never    Passive exposure: Never   Smokeless tobacco: Never  Vaping Use    Vaping status: Never Used  Substance and Sexual Activity   Alcohol use: No    Alcohol/week: 0.0 standard drinks of alcohol   Drug use: No   Sexual activity: Not Currently  Other Topics Concern   Not on file  Social History Narrative   Married.   1 daughter, 4 grandchildren.   Works in [employer] in [place].   Enjoys working on tractors.    Social Determinants of Health   Financial Resource Strain: Low Risk  (01/13/2023)   Overall Financial Resource Strain (CARDIA)    Difficulty of Paying Living Expenses: Not hard at all  Food Insecurity: No Food Insecurity (01/13/2023)   Hunger Vital Sign    Worried About Running Out of Food in the Last Year: Never true    Ran Out of Food in the Last Year: Never true  Transportation Needs: No Transportation Needs (01/13/2023)   PRAPARE - Administrator, Civil Service (Medical): No    Lack of Transportation (Non-Medical): No  Physical Activity: Sufficiently Active (01/13/2023)   Exercise Vital Sign    Days of Exercise per Week: 7 days    Minutes of Exercise per Session: 30 min  Stress: No Stress Concern Present (01/13/2023)   [employer] of Occupational Health - Occupational Stress Questionnaire    Feeling of Stress :  Not at all  Social Connections: Moderately Integrated (01/13/2023)   Social Connection and Isolation Panel [NHANES]    Frequency of Communication with Friends and Family: Three times a week    Frequency of Social Gatherings with Friends and Family: Three times a week    Attends Religious Services: More than 4 times per year    Active Member of Clubs or Organizations: No    Attends Banker Meetings: Never    Marital Status: Married  Catering manager Violence: Not At Risk (01/13/2023)   Humiliation, Afraid, Rape, and Kick questionnaire    Fear of Current or Ex-Partner: No    Emotionally Abused: No    Physically Abused: No    Sexually Abused: No    Past Surgical History:  Procedure Laterality Date    CATARACT EXTRACTION W/ INTRAOCULAR LENS IMPLANT Right 11/05/2017   CATARACT EXTRACTION W/ INTRAOCULAR LENS IMPLANT Left 12/08/2017   LEFT HEART CATH AND CORONARY ANGIOGRAPHY N/A 04/18/2022   Procedure: LEFT HEART CATH AND CORONARY ANGIOGRAPHY;  Surgeon: Armando Reichert, MD;  Location: [facility] INVASIVE CV LAB;  Service: Cardiovascular;  Laterality: N/A;    Family History  Problem Relation Age of Onset   Hypertension Father     No Known Allergies  Current Outpatient Medications on File Prior to Visit  Medication Sig Dispense Refill   amiodarone (PACERONE) 200 MG tablet Take 1 tablet (200 mg total) by mouth daily. 30 tablet 1   apixaban (ELIQUIS) 5 MG TABS tablet Take 1 tablet (5 mg total) by mouth 2 (two) times daily. 60 tablet 1   aspirin 81 MG chewable tablet Chew 1 tablet (81 mg total) by mouth daily. 90 tablet 1   dapagliflozin propanediol (FARXIGA) 10 MG TABS tablet Take 1 tablet (10 mg total) by mouth daily before breakfast. 30 tablet 5   finasteride (PROSCAR) 5 MG tablet TAKE 1 TABLET (5 MG TOTAL) BY MOUTH DAILY. 90 tablet 3   furosemide (LASIX) 20 MG tablet TAKE 1 TABLET BY MOUTH EVERY DAY 30 tablet 0   levothyroxine (SYNTHROID) 50 MCG tablet Take 50 mcg by mouth daily before breakfast.     metoprolol succinate (TOPROL-XL) 25 MG 24 hr tablet Take 0.5 tablets (12.5 mg total) by mouth daily. 30 tablet 0   Omega-3 Fatty Acids (FISH OIL) 1000 MG CAPS Take 1,000 mg by mouth daily.     rosuvastatin (CRESTOR) 40 MG tablet Take 1 tablet (40 mg total) by mouth daily. for cholesterol. 90 tablet 2   sacubitril-valsartan (ENTRESTO) 24-26 MG Take 1 tablet by mouth 2 (two) times daily.     No current facility-administered medications on file prior to visit.    BP 128/62   Pulse (!) 53   Temp (!) 97.3 F (36.3 C) (Temporal)   Ht 6' (1.829 m)   Wt 293 lb (132.9 kg)   SpO2 98%   BMI 39.74 kg/m  Objective:   Physical Exam Cardiovascular:     Rate and Rhythm: Normal rate and regular  rhythm.  Pulmonary:     Effort: Pulmonary effort is normal.     Breath sounds: Normal breath sounds. No wheezing or rales.  Musculoskeletal:     Cervical back: Neck supple.  Skin:    General: Skin is warm and dry.  Neurological:     Mental Status: He is alert and oriented to person, place, and time.           Assessment & Plan:  Type 2 diabetes mellitus with hyperglycemia,  without long-term current use of insulin (HCC) -     POCT glycosylated hemoglobin (Hb A1C) -     Ozempic (0.25 or 0.5 MG/DOSE); Inject 0.25 mg into the skin once weekly for 4 weeks, then increase to 0.5 mg once weekly thereafter for diabetes.  Dispense: 9 mL; Refill: 0        Doreene Nest, NP

## 2023-06-18 NOTE — Assessment & Plan Note (Addendum)
Well controlled with A1C of 6.1 today.   Given the need for weight loss so that he can proceed with knee surgery, discussed the option for GLP 1 agonist treatment. He agreed to proceed. Stop metformin XR 500 mg daily.  Continue Farxiga 10 mg daily. Start semaglutide (Ozempic) for diabetes/weight loss. Start by injecting 0.25 mg into the skin once weekly for 4 weeks, then increase to 0.5 mg once weekly thereafter.   He declines to update pneumonia vaccine today.  He will update regarding goals of weight loss. Will plan to see him back in 3 months for follow-up.

## 2023-06-18 NOTE — Patient Instructions (Signed)
Stop taking metformin for diabetes.  Start semaglutide (Ozempic) for diabetes/weight loss. Start by injecting 0.25 mg into the skin once weekly for 4 weeks, then increase to 0.5 mg once weekly thereafter.   Continue Farxiga for diabetes and heart protection.  Please schedule a follow up visit for 3 months for diabetes check.  It was a pleasure to see you today!

## 2023-07-16 ENCOUNTER — Ambulatory Visit: Payer: Medicare HMO | Admitting: Urology

## 2023-07-16 IMAGING — DX DG CHEST 2V
3 series · 3 of 3 positions shown · non-contrast
Comparison: None.

CLINICAL DATA: Dyspnea on exertion.

EXAM:
CHEST - 2 VIEW

[chest pa (1 of 2)]
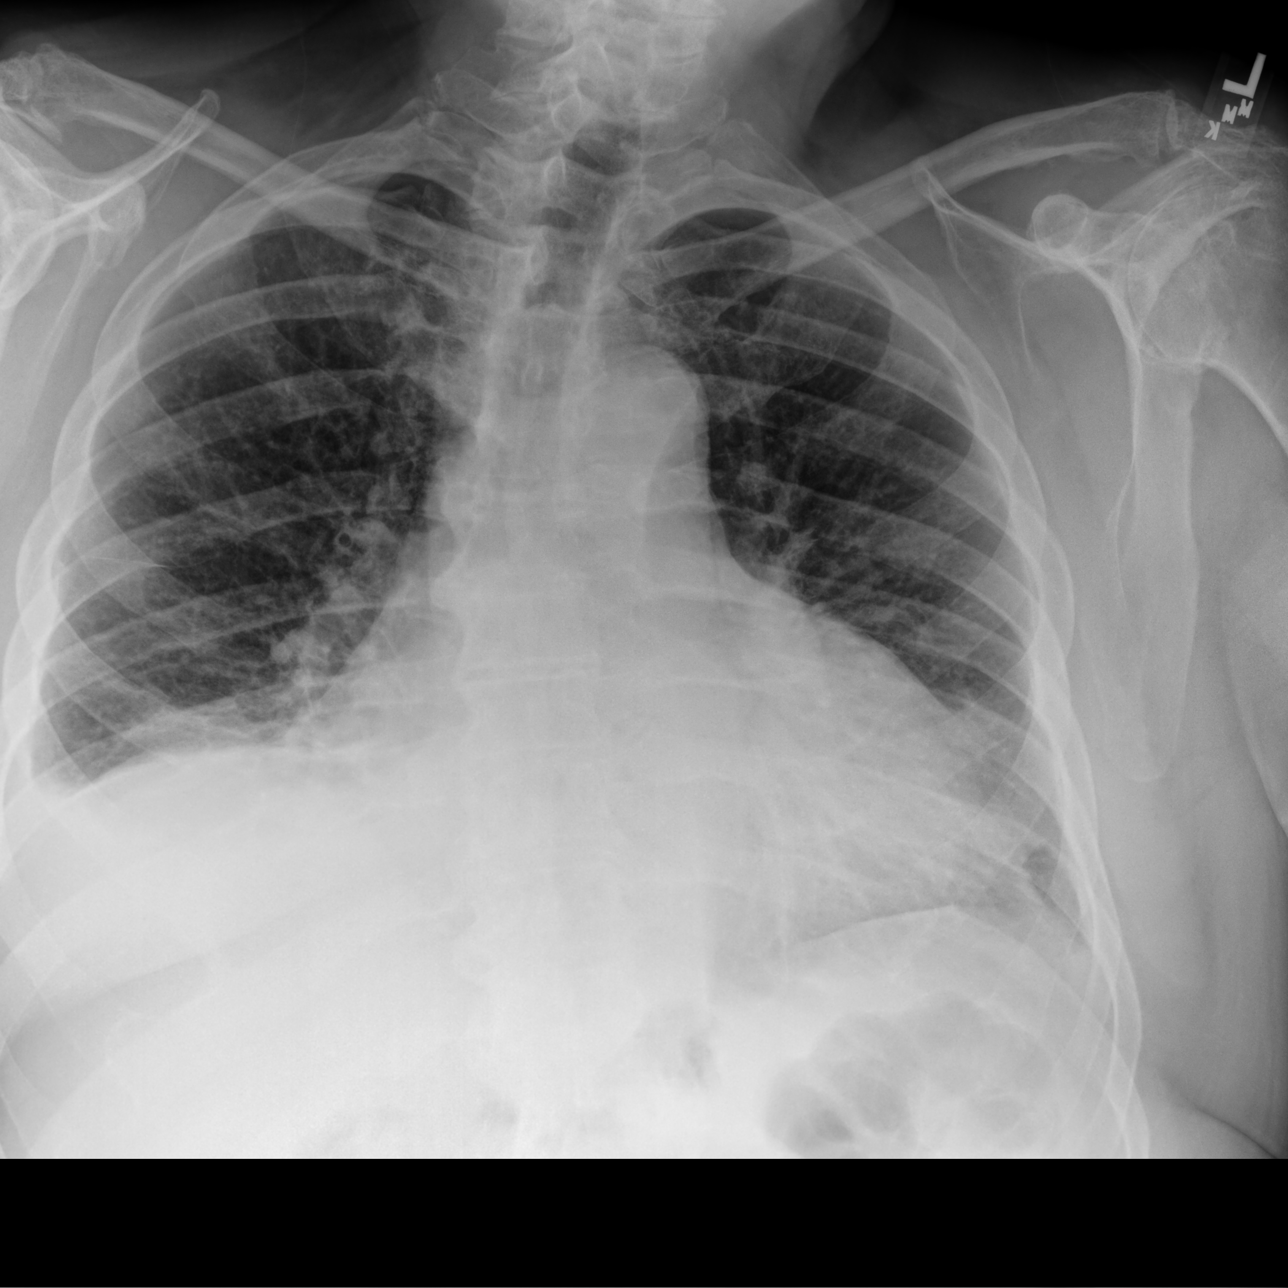

[chest pa (2 of 2)]
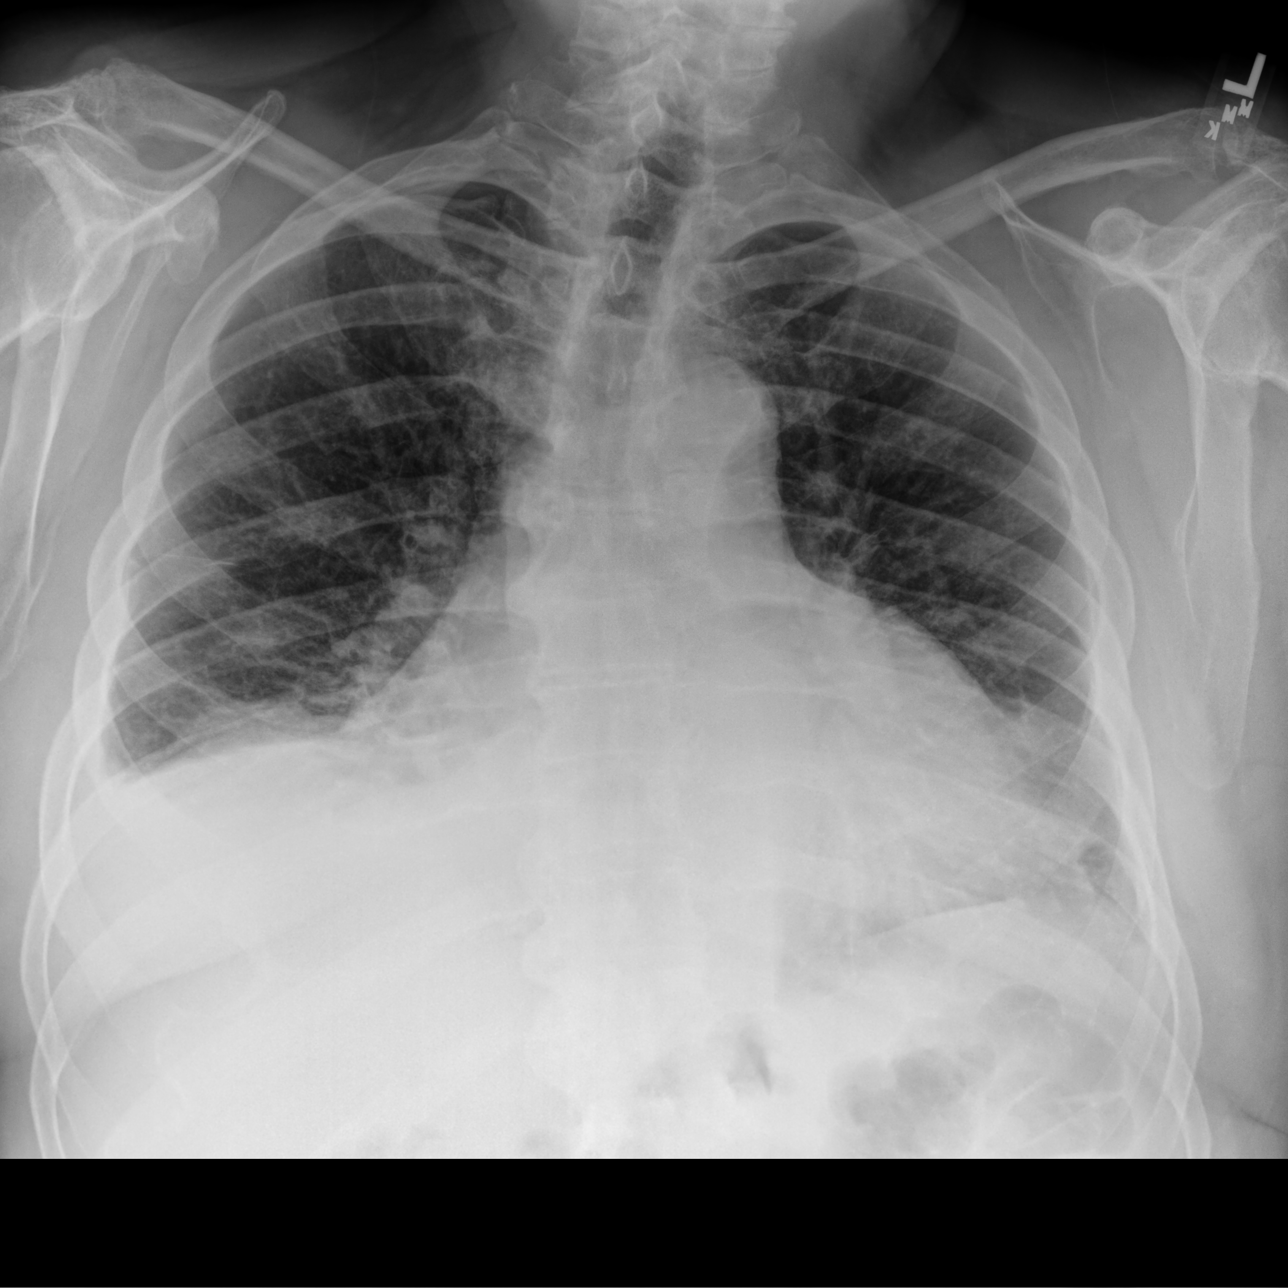

[chest lat]
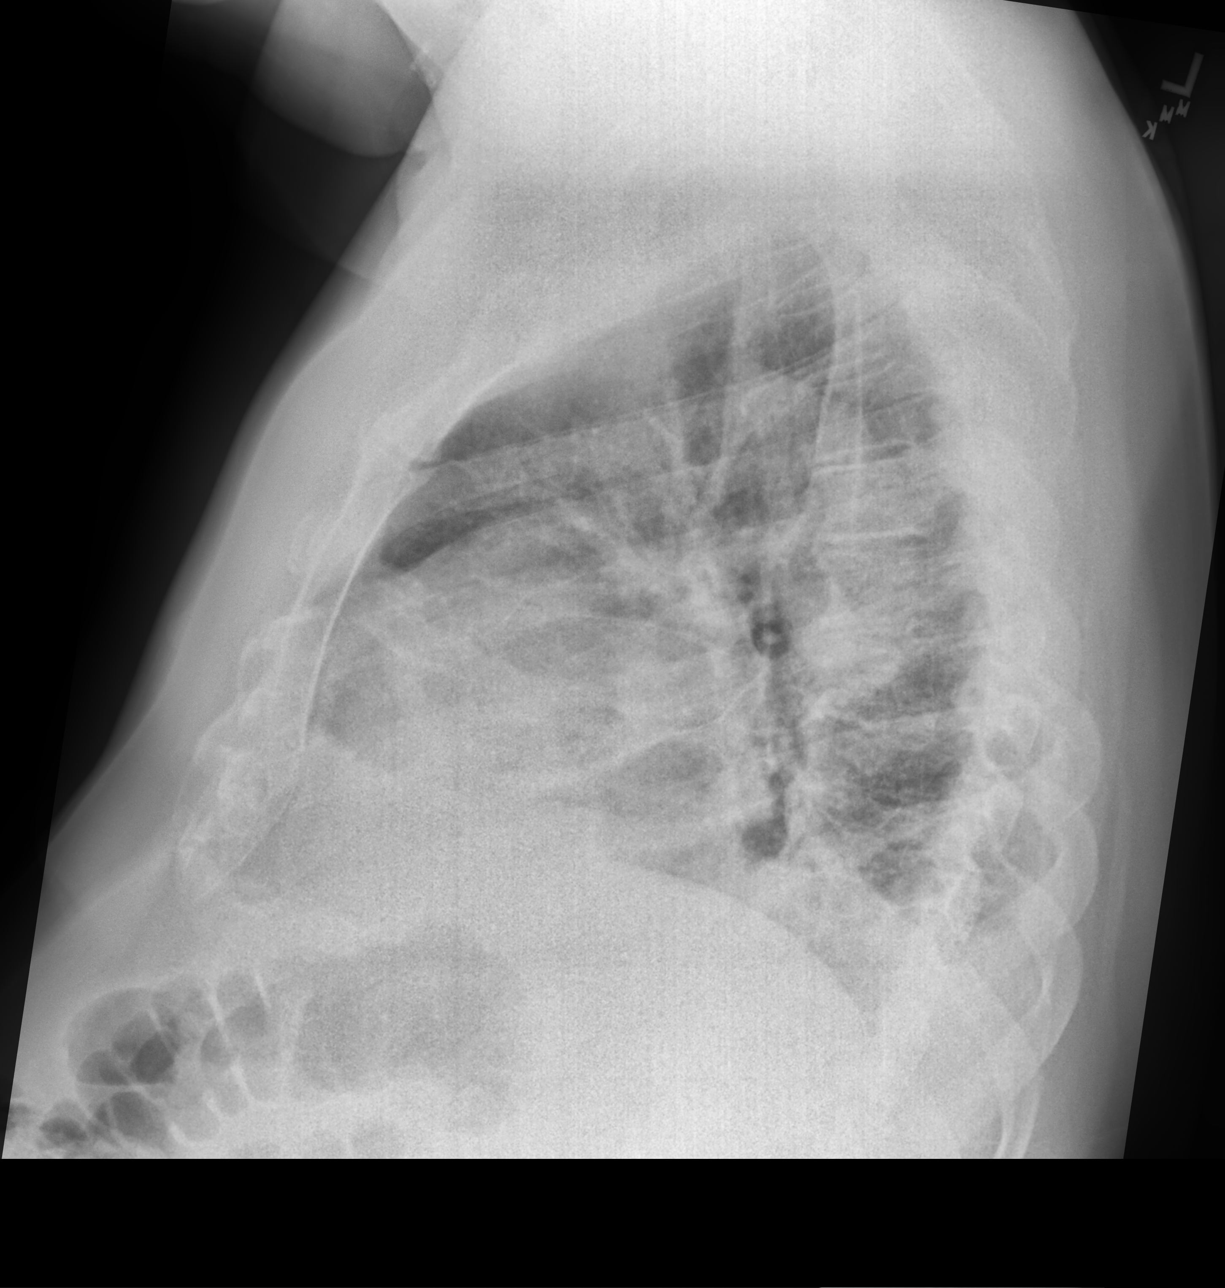

[3 of 3 positions shown; findings below may reference images not displayed]

FINDINGS: Mild cardiomegaly is noted. Left lung is clear. Elevated right
hemidiaphragm is noted. Mild right basilar atelectasis is noted with
small pleural effusion. Bony thorax is unremarkable.
IMPRESSION: Elevated right hemidiaphragm. Mild right basilar atelectasis is
noted with small right pleural effusion.

## 2023-07-16 IMAGING — CT CT ANGIO CHEST
2 of 7 series · 18 of 46 positions shown · IV contrast (APPLIED)
Comparison: None.

CLINICAL DATA: Pulmonary embolism suspected, D-dimer positive.
Chest pain which radiates.

EXAM:
CT ANGIOGRAPHY CHEST WITH CONTRAST
TECHNIQUE: Multidetector CT imaging of the chest was performed using the
standard protocol during bolus administration of intravenous
contrast. Multiplanar CT image reconstructions and MIPs were
obtained to evaluate the vascular anatomy.

[Series 6: thins · axial · 0.88mm/px · z∈[-695,-374]mm · 15 of 516 slices shown]
[im 29/516  lung]
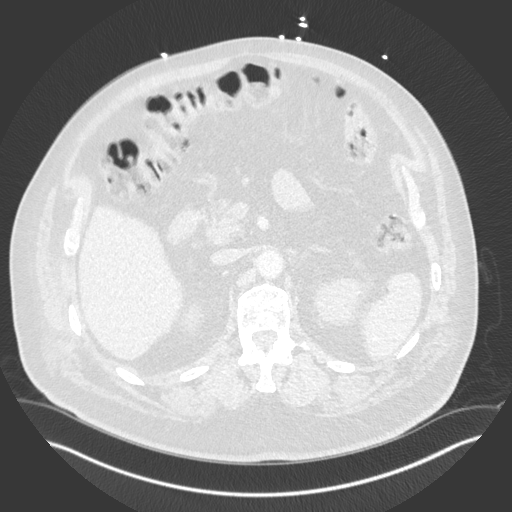
[im 58/516  soft-tissue]
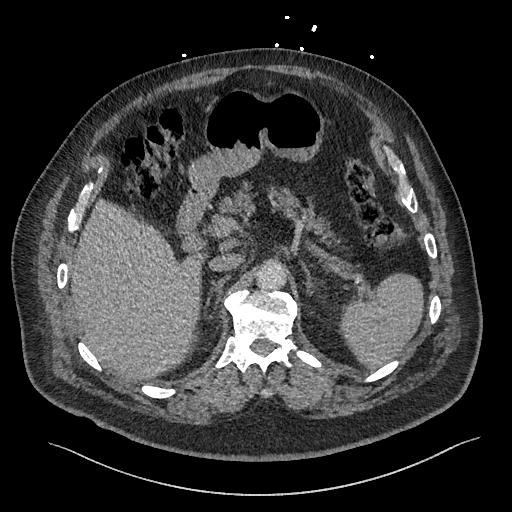
[im 86/516  lung]
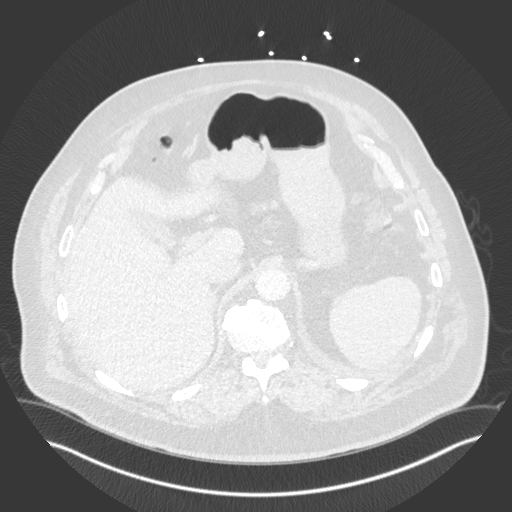
[im 115/516  soft-tissue]
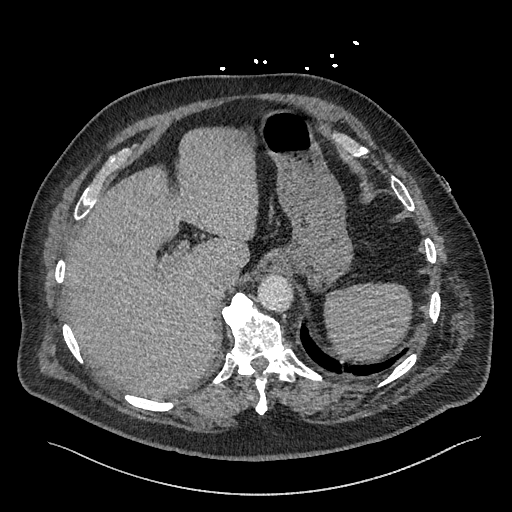
[im 172/516  lung]
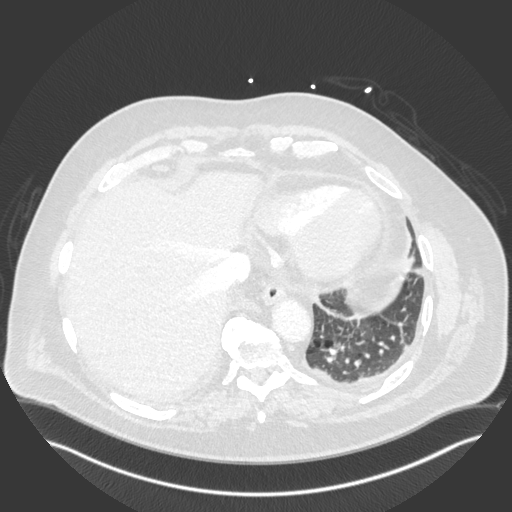
[im 201/516  soft-tissue]
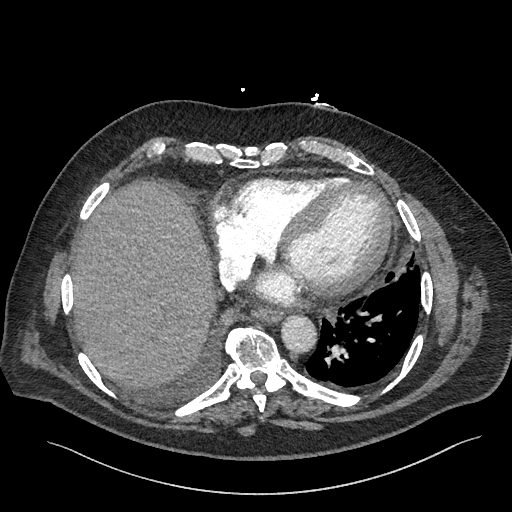
[im 229/516  lung]
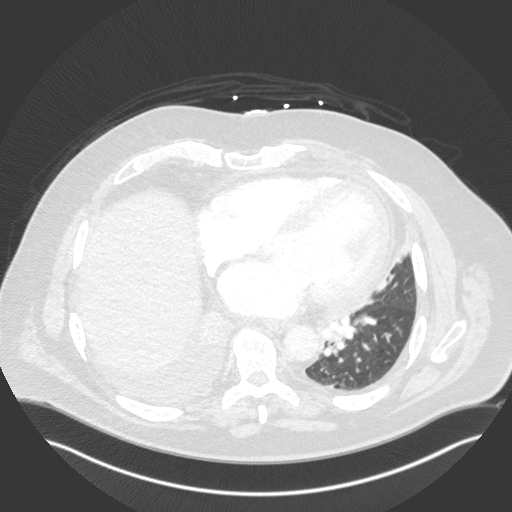
[im 258/516  soft-tissue]
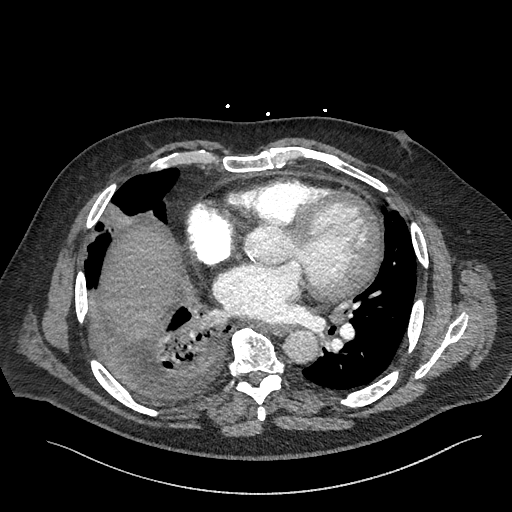
[im 287/516  lung]
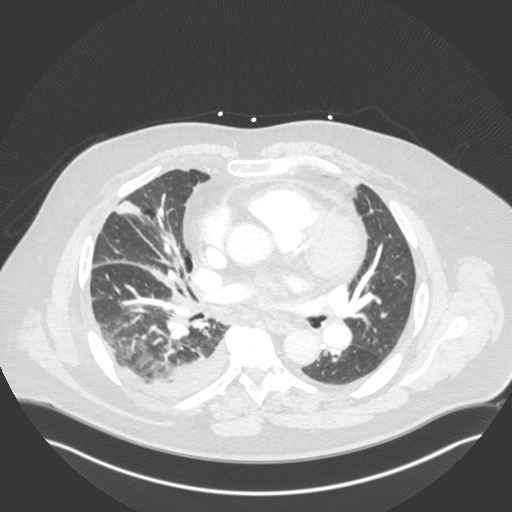
[im 315/516  soft-tissue]
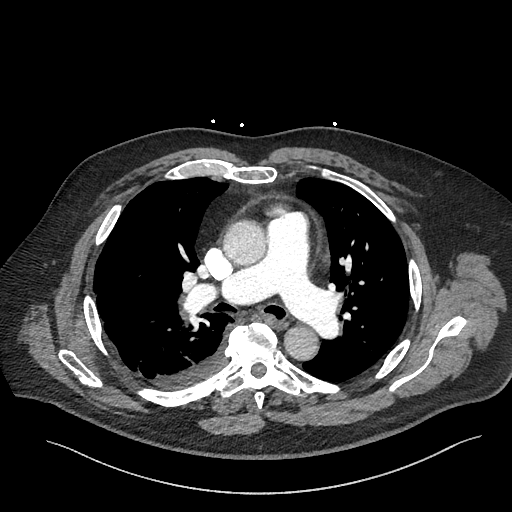
[im 344/516  lung]
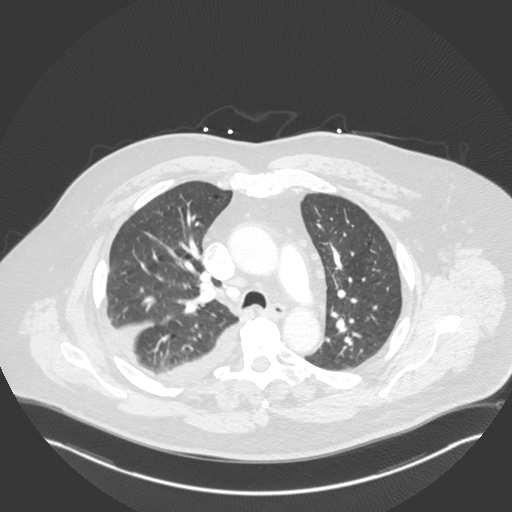
[im 401/516  soft-tissue]
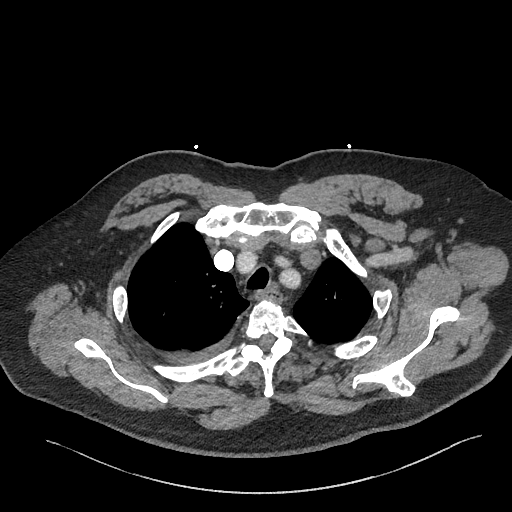
[im 430/516  lung]
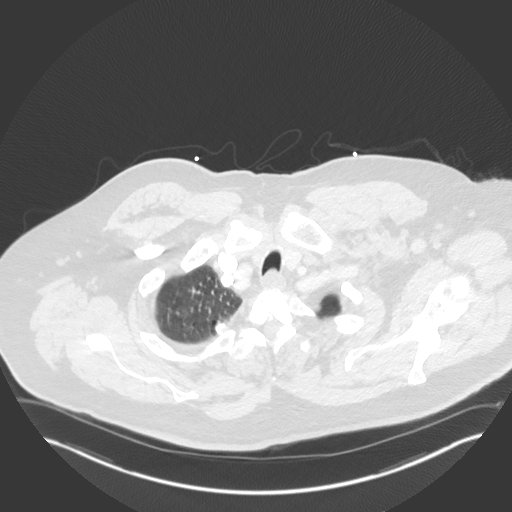
[im 458/516  soft-tissue]
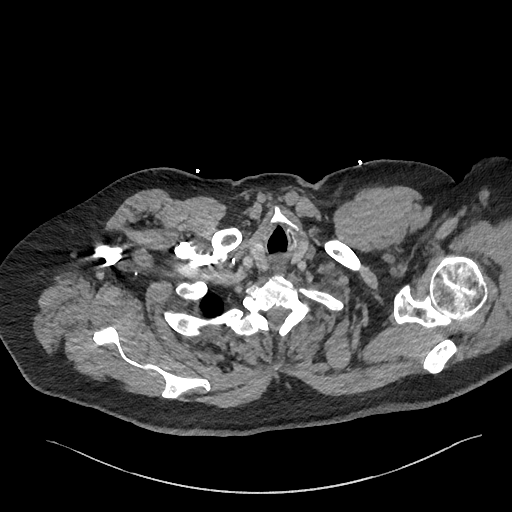
[im 487/516  lung]
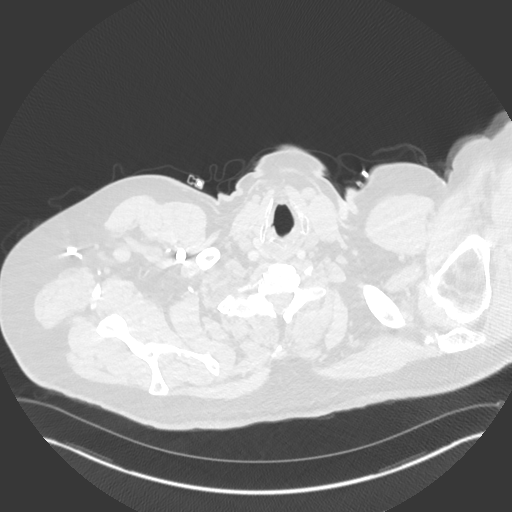

[Series 7: cor · coronal · 0.75mm/px · 3 of 191 slices shown]
[im 48/191  soft-tissue]
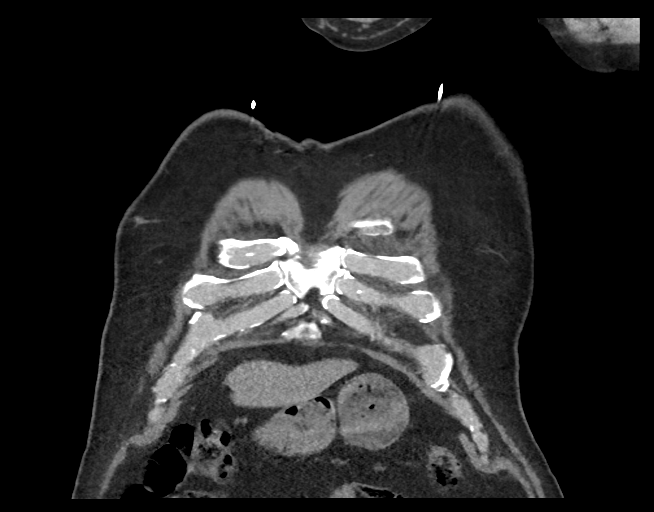
[im 96/191  soft-tissue]
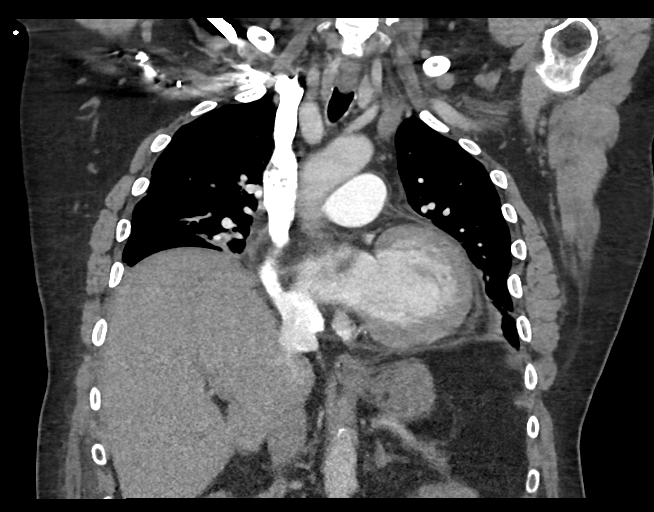
[im 143/191  soft-tissue]
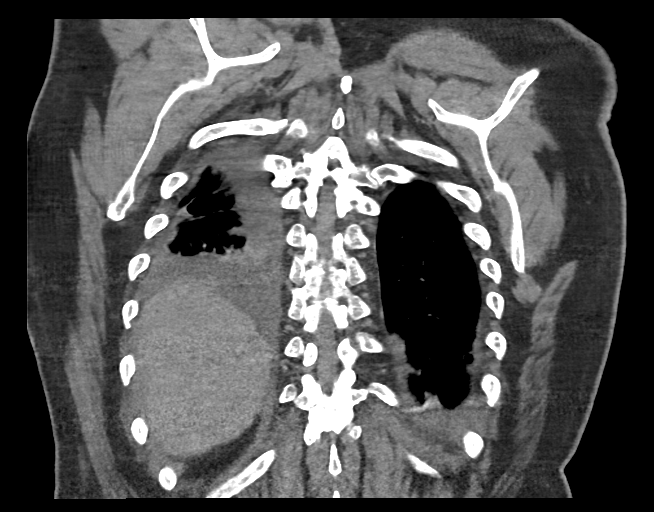

[18 of 46 positions shown; findings below may reference images not displayed]

RADIATION DOSE REDUCTION: This exam was performed according to the
departmental dose-optimization program which includes automated
exposure control, adjustment of the mA and/or kV according to
patient size and/or use of iterative reconstruction technique.

CONTRAST:  100mL OMNIPAQUE IOHEXOL 350 MG/ML SOLN
FINDINGS: Cardiovascular: Satisfactory opacification of the pulmonary arteries
to the segmental level. No evidence of pulmonary embolism. Aortic
atherosclerosis without aneurysmal dilation. Mild left-sided cardiac
enlargement. Small pericardial effusion.

Mediastinum/Nodes: No supraclavicular adenopathy. No discrete
thyroid nodule. No pathologically enlarged mediastinal, hilar or
axillary lymph nodes.

Lungs/Pleura: Diffuse bilateral bronchial wall thickening. Small
right-sided pleural effusion with adjacent consolidation. Trace left
pleural effusion with adjacent consolidation. Linear opacities in
the right middle lobe and right lower lobe are favored to reflect
atelectasis. Scattered right greater than left gas-filled cystic
spaces may reflect pneumatoceles.

Upper Abdomen: No acute abnormality.

Musculoskeletal: Thoracic diffuse idiopathic skeletal hyperostosis.
No acute osseous abnormality.

Review of the MIP images confirms the above findings.
IMPRESSION: 1. No evidence of acute pulmonary embolism.
2. Small right and trace left pleural effusions with adjacent
consolidations which may reflect atelectasis versus infiltrate.
3. Diffuse bilateral bronchial wall thickening with linear
atelectasis in the right middle and right lower lobes.
4. Small pericardial effusion.
5.  Aortic Atherosclerosis ([id number]).

## 2023-09-08 ENCOUNTER — Other Ambulatory Visit: Payer: Self-pay | Admitting: Primary Care

## 2023-09-08 DIAGNOSIS — E1165 Type 2 diabetes mellitus with hyperglycemia: Secondary | ICD-10-CM

## 2023-09-10 ENCOUNTER — Ambulatory Visit: Payer: Medicare HMO | Admitting: Urology

## 2023-09-18 ENCOUNTER — Encounter: Payer: Self-pay | Admitting: Primary Care

## 2023-09-18 ENCOUNTER — Ambulatory Visit (INDEPENDENT_AMBULATORY_CARE_PROVIDER_SITE_OTHER): Payer: Medicare HMO | Admitting: Primary Care

## 2023-09-18 VITALS — BP 122/72 | HR 52 | Temp 98.0°F | Ht 72.0 in | Wt 284.4 lb

## 2023-09-18 DIAGNOSIS — E1165 Type 2 diabetes mellitus with hyperglycemia: Secondary | ICD-10-CM

## 2023-09-18 DIAGNOSIS — Z7985 Long-term (current) use of injectable non-insulin antidiabetic drugs: Secondary | ICD-10-CM | POA: Diagnosis not present

## 2023-09-18 LAB — POCT GLYCOSYLATED HEMOGLOBIN (HGB A1C): Hemoglobin A1C: 5.7 % — AB (ref 4.0–5.6)

## 2023-09-18 MED ORDER — TIRZEPATIDE 2.5 MG/0.5ML ~~LOC~~ SOAJ: 2.5000 mg | SUBCUTANEOUS | 0 refills | Status: DC

## 2023-09-18 NOTE — Patient Instructions (Signed)
Start tirzepitide Adam Jennings) for diabetes/weight loss. Start by injecting 2.5 mg into the skin once weekly for 4 weeks, then increase to 5 mg once weekly thereafter. Please notify me once you've used your last 2.5 mg pen so that I can prescribe the next dose.   Please schedule a physical to meet with me in 3 months.   It was a pleasure to see you today!

## 2023-09-18 NOTE — Assessment & Plan Note (Addendum)
Improved with A1C of 5.7 today!  Unfortunately, Ozempic caused Gi side effects. He is interested in GLP-1 agonist, so we will trial Mounjaro to see if he can tolerate better.  Start tirzepitide Adam Jennings) for diabetes Start by injecting 2.5 mg into the skin once weekly for 4 weeks, then increase to 5 mg once weekly thereafter.  Continue Farxiga 10 mg daily.  He declines to update pneumonia vaccine. Discussed to schedule eye exam.  Follow-up in 6 months.

## 2023-09-18 NOTE — Progress Notes (Signed)
Subjective:    Patient ID: Adam Jennings, male    DOB: 04-Jan-1946, 77 y.o.   MRN: [id number]  Diabetes Pertinent negatives for hypoglycemia include no dizziness or headaches. Pertinent negatives for diabetes include no chest pain.    Adam Jennings is a very pleasant 77 y.o. male with a history of type 2 diabetes, hypertension, atrial fibrillation with RVR, CHF, hypothyroidism, AKI who presents today for follow-up of diabetes.  Current medications include: Farxiga 10 mg daily, Ozempic 0.5 mg weekly. He stopped Ozempic 2-3 weeks ago due to diarrhea.   He is checking his blood glucose 0 times daily.  Last A1C: 6.1 in July 2024, 5.7 today Last Eye Exam: Due, he will schedule Last Foot Exam: Due Pneumonia Vaccination: 2015 Urine Microalbumin: Up-to-date Statin: Rosuvastatin  Dietary changes since last visit: Reduced portion sizes, little snacking. Mostly drinking water now.    Exercise: None. Active.    Wt Readings from Last 3 Encounters:  09/18/23 284 lb 6.4 oz (129 kg)  06/18/23 293 lb (132.9 kg)  01/13/23 284 lb (128.8 kg)      Review of Systems  Respiratory:  Negative for shortness of breath.   Cardiovascular:  Negative for chest pain.  Neurological:  Negative for dizziness and headaches.         Past Medical History:  Diagnosis Date   Arrhythmia    atrial fibrillation   BPH (benign prostatic hyperplasia)    CHF (congestive heart failure) (HCC)    Diabetes mellitus without complication (HCC)    Dizziness    Elevated blood pressure    Hernia of abdominal wall    Hypertension     Social History   Socioeconomic History   Marital status: Married    Spouse name: Not on file   Number of children: Not on file   Years of education: Not on file   Highest education level: Not on file  Occupational History   Not on file  Tobacco Use   Smoking status: Never    Passive exposure: Never   Smokeless tobacco: Never  Vaping Use   Vaping status: Never Used   Substance and Sexual Activity   Alcohol use: No    Alcohol/week: 0.0 standard drinks of alcohol   Drug use: No   Sexual activity: Not Currently  Other Topics Concern   Not on file  Social History Narrative   Married.   1 daughter, 4 grandchildren.   Works in [employer] in [place].   Enjoys working on tractors.    Social Determinants of Health   Financial Resource Strain: Low Risk  (01/13/2023)   Overall Financial Resource Strain (CARDIA)    Difficulty of Paying Living Expenses: Not hard at all  Food Insecurity: No Food Insecurity (01/13/2023)   Hunger Vital Sign    Worried About Running Out of Food in the Last Year: Never true    Ran Out of Food in the Last Year: Never true  Transportation Needs: No Transportation Needs (01/13/2023)   PRAPARE - Administrator, Civil Service (Medical): No    Lack of Transportation (Non-Medical): No  Physical Activity: Sufficiently Active (01/13/2023)   Exercise Vital Sign    Days of Exercise per Week: 7 days    Minutes of Exercise per Session: 30 min  Stress: No Stress Concern Present (01/13/2023)   [employer] of Occupational Health - Occupational Stress Questionnaire    Feeling of Stress : Not at all  Social Connections: Moderately Integrated (01/13/2023)  Social Connection and Isolation Panel [NHANES]    Frequency of Communication with Friends and Family: Three times a week    Frequency of Social Gatherings with Friends and Family: Three times a week    Attends Religious Services: More than 4 times per year    Active Member of Clubs or Organizations: No    Attends Banker Meetings: Never    Marital Status: Married  Catering manager Violence: Not At Risk (01/13/2023)   Humiliation, Afraid, Rape, and Kick questionnaire    Fear of Current or Ex-Partner: No    Emotionally Abused: No    Physically Abused: No    Sexually Abused: No    Past Surgical History:  Procedure Laterality Date   CATARACT EXTRACTION W/  INTRAOCULAR LENS IMPLANT Right 11/05/2017   CATARACT EXTRACTION W/ INTRAOCULAR LENS IMPLANT Left 12/08/2017   LEFT HEART CATH AND CORONARY ANGIOGRAPHY N/A 04/18/2022   Procedure: LEFT HEART CATH AND CORONARY ANGIOGRAPHY;  Surgeon: Armando Reichert, MD;  Location: [facility] INVASIVE CV LAB;  Service: Cardiovascular;  Laterality: N/A;    Family History  Problem Relation Age of Onset   Hypertension Father     No Known Allergies  Current Outpatient Medications on File Prior to Visit  Medication Sig Dispense Refill   amiodarone (PACERONE) 200 MG tablet Take 1 tablet (200 mg total) by mouth daily. 30 tablet 1   apixaban (ELIQUIS) 5 MG TABS tablet Take 1 tablet (5 mg total) by mouth 2 (two) times daily. 60 tablet 1   aspirin 81 MG chewable tablet Chew 1 tablet (81 mg total) by mouth daily. 90 tablet 1   dapagliflozin propanediol (FARXIGA) 10 MG TABS tablet Take 1 tablet (10 mg total) by mouth daily before breakfast. 30 tablet 5   finasteride (PROSCAR) 5 MG tablet TAKE 1 TABLET (5 MG TOTAL) BY MOUTH DAILY. 90 tablet 3   furosemide (LASIX) 20 MG tablet TAKE 1 TABLET BY MOUTH EVERY DAY 30 tablet 0   levothyroxine (SYNTHROID) 50 MCG tablet Take 50 mcg by mouth daily before breakfast.     metoprolol succinate (TOPROL-XL) 25 MG 24 hr tablet Take 0.5 tablets (12.5 mg total) by mouth daily. 30 tablet 0   Omega-3 Fatty Acids (FISH OIL) 1000 MG CAPS Take 1,000 mg by mouth daily.     rosuvastatin (CRESTOR) 40 MG tablet Take 1 tablet (40 mg total) by mouth daily. for cholesterol. 90 tablet 2   sacubitril-valsartan (ENTRESTO) 24-26 MG Take 1 tablet by mouth 2 (two) times daily.     No current facility-administered medications on file prior to visit.    BP 122/72 (BP Location: Left Arm, Patient Position: Sitting, Cuff Size: Large)   Pulse (!) 52   Temp 98 F (36.7 C) (Temporal)   Ht 6' (1.829 m)   Wt 284 lb 6.4 oz (129 kg)   SpO2 99%   BMI 38.57 kg/m  Objective:   Physical Exam Cardiovascular:      Rate and Rhythm: Normal rate and regular rhythm.  Pulmonary:     Effort: Pulmonary effort is normal.     Breath sounds: Normal breath sounds.  Musculoskeletal:     Cervical back: Neck supple.  Skin:    General: Skin is warm and dry.  Neurological:     Mental Status: He is alert and oriented to person, place, and time.  Psychiatric:        Mood and Affect: Mood normal.  Assessment & Plan:  Type 2 diabetes mellitus with hyperglycemia, without long-term current use of insulin (HCC) -     POCT glycosylated hemoglobin (Hb A1C) -     Tirzepatide; Inject 2.5 mg into the skin once a week. for diabetes.  Dispense: 2 mL; Refill: 0        Doreene Nest, NP

## 2023-09-28 DIAGNOSIS — E039 Hypothyroidism, unspecified: Secondary | ICD-10-CM | POA: Diagnosis not present

## 2023-09-28 DIAGNOSIS — E119 Type 2 diabetes mellitus without complications: Secondary | ICD-10-CM | POA: Diagnosis not present

## 2023-09-28 LAB — HM DIABETES EYE EXAM

## 2023-10-13 ENCOUNTER — Telehealth: Payer: Self-pay

## 2023-10-13 NOTE — Patient Outreach (Signed)
Attempted to contact patient regarding DM eye. Left voicemail for patient to return my call at [phone].  Nicholes Rough, CMA Care Guide [employer]

## 2023-10-14 ENCOUNTER — Other Ambulatory Visit: Payer: Self-pay | Admitting: Primary Care

## 2023-10-14 DIAGNOSIS — E1165 Type 2 diabetes mellitus with hyperglycemia: Secondary | ICD-10-CM

## 2023-10-14 NOTE — Telephone Encounter (Signed)
Called and spoke with patients daughter, per dpr. She stated patient has been doing well on Mounjaro, no side effects and they would like to increase to 5mg .

## 2023-10-14 NOTE — Telephone Encounter (Signed)
Noted.  New prescription sent to pharmacy for 5 mg dose.

## 2023-10-14 NOTE — Telephone Encounter (Signed)
Please call patient:  How is he doing on Mounjaro, the once weekly injectable medication for diabetes that we started last month?  Any side effects? If not, we can increase him to the next dose of 5 mg weekly.

## 2023-10-29 ENCOUNTER — Ambulatory Visit: Payer: Medicare HMO | Admitting: Urology

## 2023-11-21 ENCOUNTER — Other Ambulatory Visit: Payer: Self-pay | Admitting: Primary Care

## 2023-11-21 DIAGNOSIS — E785 Hyperlipidemia, unspecified: Secondary | ICD-10-CM

## 2023-11-23 ENCOUNTER — Encounter: Payer: Self-pay | Admitting: Primary Care

## 2023-11-26 NOTE — Telephone Encounter (Signed)
 Request sent for DM eye exam from [facility]

## 2023-11-26 NOTE — Telephone Encounter (Signed)
 I am not. Can we fax for the eye exam report?

## 2023-11-26 NOTE — Telephone Encounter (Signed)
 Are you aware of them doing at [facility] clinic endocrinology

## 2023-12-02 ENCOUNTER — Encounter: Payer: Self-pay | Admitting: Primary Care

## 2023-12-23 ENCOUNTER — Encounter: Payer: Self-pay | Admitting: Primary Care

## 2023-12-23 ENCOUNTER — Ambulatory Visit: Payer: Medicare HMO | Admitting: Primary Care

## 2023-12-23 VITALS — BP 104/60 | HR 59 | Temp 97.2°F | Ht 72.0 in | Wt 280.0 lb

## 2023-12-23 DIAGNOSIS — I4891 Unspecified atrial fibrillation: Secondary | ICD-10-CM | POA: Diagnosis not present

## 2023-12-23 DIAGNOSIS — I10 Essential (primary) hypertension: Secondary | ICD-10-CM | POA: Diagnosis not present

## 2023-12-23 DIAGNOSIS — Z7985 Long-term (current) use of injectable non-insulin antidiabetic drugs: Secondary | ICD-10-CM

## 2023-12-23 DIAGNOSIS — E039 Hypothyroidism, unspecified: Secondary | ICD-10-CM

## 2023-12-23 DIAGNOSIS — Z125 Encounter for screening for malignant neoplasm of prostate: Secondary | ICD-10-CM | POA: Diagnosis not present

## 2023-12-23 DIAGNOSIS — E1165 Type 2 diabetes mellitus with hyperglycemia: Secondary | ICD-10-CM | POA: Diagnosis not present

## 2023-12-23 DIAGNOSIS — Z0000 Encounter for general adult medical examination without abnormal findings: Secondary | ICD-10-CM

## 2023-12-23 DIAGNOSIS — R351 Nocturia: Secondary | ICD-10-CM

## 2023-12-23 DIAGNOSIS — Z0001 Encounter for general adult medical examination with abnormal findings: Secondary | ICD-10-CM

## 2023-12-23 DIAGNOSIS — I5022 Chronic systolic (congestive) heart failure: Secondary | ICD-10-CM | POA: Diagnosis not present

## 2023-12-23 DIAGNOSIS — E785 Hyperlipidemia, unspecified: Secondary | ICD-10-CM

## 2023-12-23 DIAGNOSIS — M25561 Pain in right knee: Secondary | ICD-10-CM | POA: Diagnosis not present

## 2023-12-23 DIAGNOSIS — G8929 Other chronic pain: Secondary | ICD-10-CM

## 2023-12-23 LAB — COMPREHENSIVE METABOLIC PANEL
ALT: 22 U/L (ref 0–53)
AST: 26 U/L (ref 0–37)
Albumin: 4.6 g/dL (ref 3.5–5.2)
Alkaline Phosphatase: 66 U/L (ref 39–117)
BUN: 29 mg/dL — ABNORMAL HIGH (ref 6–23)
CO2: 27 meq/L (ref 19–32)
Calcium: 9.5 mg/dL (ref 8.4–10.5)
Chloride: 103 meq/L (ref 96–112)
Creatinine, Ser: 1.67 mg/dL — ABNORMAL HIGH (ref 0.40–1.50)
GFR: 39.64 mL/min — ABNORMAL LOW (ref >60.00)
Glucose, Bld: 103 mg/dL — ABNORMAL HIGH (ref 70–99)
Potassium: 4.3 meq/L (ref 3.5–5.1)
Sodium: 139 meq/L (ref 135–145)
Total Bilirubin: 0.7 mg/dL (ref 0.2–1.2)
Total Protein: 7.4 g/dL (ref 6.0–8.3)

## 2023-12-23 LAB — LIPID PANEL
Cholesterol: 225 mg/dL — ABNORMAL HIGH (ref 0–200)
HDL: 34.7 mg/dL — ABNORMAL LOW (ref >39.00)
LDL Cholesterol: 169 mg/dL — ABNORMAL HIGH (ref 0–99)
NonHDL: 189.9
Total CHOL/HDL Ratio: 6
Triglycerides: 104 mg/dL (ref 0.0–149.0)
VLDL: 20.8 mg/dL (ref 0.0–40.0)

## 2023-12-23 LAB — MICROALBUMIN / CREATININE URINE RATIO
Creatinine,U: 43.8 mg/dL
Microalb Creat Ratio: 1.6 mg/g (ref 0.0–30.0)
Microalb, Ur: <0.7 mg/dL (ref 0.0–1.9)

## 2023-12-23 LAB — PSA, MEDICARE: PSA: 4.55 ng/mL — ABNORMAL HIGH (ref 0.10–4.00)

## 2023-12-23 LAB — HEMOGLOBIN A1C: Hgb A1c MFr Bld: 5.6 % (ref 4.6–6.5)

## 2023-12-23 NOTE — Assessment & Plan Note (Signed)
Controlled.  Continue Entresto 24-26 mg daily, metoprolol succinate 12.5 mg daily. CMP pending.

## 2023-12-23 NOTE — Progress Notes (Signed)
Subjective:    Patient ID: Adam Jennings, male    DOB: 30-Jul-1946, 78 y.o.   MRN: [id number]  HPI  Jayel Inks is a very pleasant 78 y.o. male who presents today for complete physical and follow up of chronic conditions.  Immunizations: -Influenza: Declines -Shingles: Never completed  -Pneumonia: Never completed  Diet: Fair diet.  Exercise: No regular exercise.  Eye exam: Completes annually  Dental exam: Completed years ago   Colonoscopy: Completed > 10 years ago. Declines.   PSA: Due   BP Readings from Last 3 Encounters:  12/23/23 104/60  09/18/23 122/72  06/18/23 128/62    Wt Readings from Last 3 Encounters:  12/23/23 280 lb (127 kg)  09/18/23 284 lb 6.4 oz (129 kg)  06/18/23 293 lb (132.9 kg)      Review of Systems  Constitutional:  Negative for unexpected weight change.  HENT:  Negative for rhinorrhea.   Respiratory:  Negative for cough and shortness of breath.   Cardiovascular:  Negative for chest pain.  Gastrointestinal:  Negative for constipation and diarrhea.  Genitourinary:  Negative for difficulty urinating.  Musculoskeletal:  Positive for arthralgias.  Skin:  Negative for rash.  Allergic/Immunologic: Negative for environmental allergies.  Neurological:  Negative for dizziness, numbness and headaches.  Psychiatric/Behavioral:  The patient is not nervous/anxious.          Past Medical History:  Diagnosis Date   Arrhythmia    atrial fibrillation   BPH (benign prostatic hyperplasia)    CHF (congestive heart failure) (HCC)    Cloudy vision 06/30/2017   Diabetes mellitus without complication (HCC)    Dizziness    Elevated blood pressure    Elevated troponin 01/30/2022   Hernia of abdominal wall    Hypertension    Pleural effusion on right 04/17/2022   Preoperative clearance 10/23/2020    Social History   Socioeconomic History   Marital status: Married    Spouse name: Not on file   Number of children: Not on file   Years of  education: Not on file   Highest education level: Not on file  Occupational History   Not on file  Tobacco Use   Smoking status: Never    Passive exposure: Never   Smokeless tobacco: Never  Vaping Use   Vaping status: Never Used  Substance and Sexual Activity   Alcohol use: No    Alcohol/week: 0.0 standard drinks of alcohol   Drug use: No   Sexual activity: Not Currently  Other Topics Concern   Not on file  Social History Narrative   Married.   1 daughter, 4 grandchildren.   Works in [employer] in [place].   Enjoys working on tractors.    Social Drivers of Corporate investment banker Strain: Low Risk  (01/13/2023)   Overall Financial Resource Strain (CARDIA)    Difficulty of Paying Living Expenses: Not hard at all  Food Insecurity: No Food Insecurity (01/13/2023)   Hunger Vital Sign    Worried About Running Out of Food in the Last Year: Never true    Ran Out of Food in the Last Year: Never true  Transportation Needs: No Transportation Needs (01/13/2023)   PRAPARE - Administrator, Civil Service (Medical): No    Lack of Transportation (Non-Medical): No  Physical Activity: Sufficiently Active (01/13/2023)   Exercise Vital Sign    Days of Exercise per Week: 7 days    Minutes of Exercise per Session: 30 min  Stress: No Stress Concern Present (01/13/2023)   [employer] of Occupational Health - Occupational Stress Questionnaire    Feeling of Stress : Not at all  Social Connections: Moderately Integrated (01/13/2023)   Social Connection and Isolation Panel [NHANES]    Frequency of Communication with Friends and Family: Three times a week    Frequency of Social Gatherings with Friends and Family: Three times a week    Attends Religious Services: More than 4 times per year    Active Member of Clubs or Organizations: No    Attends Banker Meetings: Never    Marital Status: Married  Catering manager Violence: Not At Risk (01/13/2023)   Humiliation, Afraid,  Rape, and Kick questionnaire    Fear of Current or Ex-Partner: No    Emotionally Abused: No    Physically Abused: No    Sexually Abused: No    Past Surgical History:  Procedure Laterality Date   CATARACT EXTRACTION W/ INTRAOCULAR LENS IMPLANT Right 11/05/2017   CATARACT EXTRACTION W/ INTRAOCULAR LENS IMPLANT Left 12/08/2017   LEFT HEART CATH AND CORONARY ANGIOGRAPHY N/A 04/18/2022   Procedure: LEFT HEART CATH AND CORONARY ANGIOGRAPHY;  Surgeon: Armando Reichert, MD;  Location: [facility] IN[facility];  Service: Cardiovascular;  Laterality: N/A;    Family History  Problem Relation Age of Onset   Hypertension Father     No Known Allergies  Current Outpatient Medications on File Prior to Visit  Medication Sig Dispense Refill   amiodarone (PACERONE) 200 MG tablet Take 1 tablet (200 mg total) by mouth daily. 30 tablet 1   apixaban (ELIQUIS) 5 MG TABS tablet Take 1 tablet (5 mg total) by mouth 2 (two) times daily. 60 tablet 1   aspirin 81 MG chewable tablet Chew 1 tablet (81 mg total) by mouth daily. 90 tablet 1   dapagliflozin propanediol (FARXIGA) 10 MG TABS tablet Take 1 tablet (10 mg total) by mouth daily before breakfast. 30 tablet 5   finasteride (PROSCAR) 5 MG tablet TAKE 1 TABLET (5 MG TOTAL) BY MOUTH DAILY. 90 tablet 3   furosemide (LASIX) 20 MG tablet TAKE 1 TABLET BY MOUTH EVERY DAY 30 tablet 0   levothyroxine (SYNTHROID) 100 MCG tablet Take 100 mcg by mouth daily before breakfast.     metoprolol succinate (TOPROL-XL) 25 MG 24 hr tablet Take 0.5 tablets (12.5 mg total) by mouth daily. 30 tablet 0   Omega-3 Fatty Acids (FISH OIL) 1000 MG CAPS Take 1,000 mg by mouth daily.     rosuvastatin (CRESTOR) 40 MG tablet TAKE 1 TABLET BY MOUTH EVERY DAY FOR CHOLESTEROL 90 tablet 0   sacubitril-valsartan (ENTRESTO) 24-26 MG Take 1 tablet by mouth 2 (two) times daily.     tirzepatide [facility]) 5 MG/0.5ML Pen Inject 5 mg into the skin once a week. for diabetes. 6 mL 0   No current  facility-administered medications on file prior to visit.    BP 104/60   Pulse (!) 59   Temp (!) 97.2 F (36.2 C) (Temporal)   Ht 6' (1.829 m)   Wt 280 lb (127 kg)   SpO2 98%   BMI 37.97 kg/m  Objective:   Physical Exam HENT:     Right Ear: Tympanic membrane and ear canal normal.     Left Ear: Tympanic membrane and ear canal normal.  Eyes:     Pupils: Pupils are equal, round, and reactive to light.  Cardiovascular:     Rate and Rhythm: Normal rate and regular  rhythm.  Pulmonary:     Effort: Pulmonary effort is normal.     Breath sounds: Normal breath sounds.  Abdominal:     General: Bowel sounds are normal.     Palpations: Abdomen is soft.     Tenderness: There is no abdominal tenderness.  Musculoskeletal:        General: Normal range of motion.     Cervical back: Neck supple.  Skin:    General: Skin is warm and dry.  Neurological:     Mental Status: He is alert and oriented to person, place, and time.     Cranial Nerves: No cranial nerve deficit.     Deep Tendon Reflexes:     Reflex Scores:      Patellar reflexes are 2+ on the right side and 2+ on the left side. Psychiatric:        Mood and Affect: Mood normal.           Assessment & Plan:  Preventative health care Assessment & Plan: Declines all vaccines. Colonoscopy declined given age. PSA due and pending.  Discussed the importance of a healthy diet and regular exercise in order for weight loss, and to reduce the risk of further co-morbidity.  Exam stable. Labs pending.  Follow up in 1 year for repeat physical.    Atrial fibrillation with RVR (HCC) Assessment & Plan: Rate and rhythm regular.  Following with cardiology, office notes reviewed from July 2024 through [facility]. Continue amiodarone 200 mg daily, apixaban 5 mg twice daily, metoprolol succinate 12.5 mg daily.   Type 2 diabetes mellitus with hyperglycemia, without long-term current use of insulin (HCC) -     Microalbumin /  creatinine urine ratio -     Hemoglobin A1c  Hyperlipidemia, unspecified hyperlipidemia type Assessment & Plan: Repeat lipid panel pending.  Continue rosuvastatin 40 mg daily.  Orders: -     Lipid panel -     Comprehensive metabolic panel  Essential hypertension Assessment & Plan: Controlled.  Continue Entresto 24-26 mg daily, metoprolol succinate 12.5 mg daily. CMP pending.   Screening for prostate cancer -     PSA, Medicare  Chronic systolic heart failure (HCC) Assessment & Plan: Appears euvolemic today. Following with cardiology, office notes reviewed from July 2024.  Continue furosemide 20 mg daily, Entresto 24-26 mg daily, metoprolol succinate 12.5 mg daily.   Hypothyroidism, unspecified type Assessment & Plan: Following with endocrinology, office notes reviewed from November 2024 through [facility].  Continue levothyroxine 100 mcg daily.  Will update our medication list.   Chronic pain of right knee Assessment & Plan: Stable.  Commended him on weight loss! Continue to monitor.    Nocturia Assessment & Plan: Controlled.  Continue finasteride 5 mg daily.         Doreene Nest, NP

## 2023-12-23 NOTE — Assessment & Plan Note (Signed)
Commended him on weight loss!  Repeat A1c pending.   Urine microalbumin due and pending. Continue Mounjaro 5 mg weekly, Farxiga 10 mg daily.  Follow-up in 6 months.

## 2023-12-23 NOTE — Assessment & Plan Note (Signed)
Following with endocrinology, office notes reviewed from November 2024 through [facility].  Continue levothyroxine 100 mcg daily.  Will update our medication list.

## 2023-12-23 NOTE — Assessment & Plan Note (Signed)
Stable.  Commended him on weight loss! Continue to monitor.

## 2023-12-23 NOTE — Assessment & Plan Note (Signed)
Controlled.  Continue finasteride 5 mg daily.

## 2023-12-23 NOTE — Assessment & Plan Note (Signed)
Repeat lipid panel pending.  Continue rosuvastatin 40 mg daily.

## 2023-12-23 NOTE — Assessment & Plan Note (Signed)
Rate and rhythm regular.  Following with cardiology, office notes reviewed from July 2024 through [facility]. Continue amiodarone 200 mg daily, apixaban 5 mg twice daily, metoprolol succinate 12.5 mg daily.

## 2023-12-23 NOTE — Assessment & Plan Note (Signed)
Declines all vaccines. Colonoscopy declined given age. PSA due and pending.  Discussed the importance of a healthy diet and regular exercise in order for weight loss, and to reduce the risk of further co-morbidity.  Exam stable. Labs pending.  Follow up in 1 year for repeat physical.

## 2023-12-23 NOTE — Assessment & Plan Note (Signed)
Appears euvolemic today. Following with cardiology, office notes reviewed from July 2024.  Continue furosemide 20 mg daily, Entresto 24-26 mg daily, metoprolol succinate 12.5 mg daily.

## 2023-12-24 ENCOUNTER — Other Ambulatory Visit: Payer: Self-pay | Admitting: Primary Care

## 2023-12-24 DIAGNOSIS — I10 Essential (primary) hypertension: Secondary | ICD-10-CM

## 2023-12-26 ENCOUNTER — Other Ambulatory Visit: Payer: Self-pay | Admitting: Primary Care

## 2023-12-26 DIAGNOSIS — E1165 Type 2 diabetes mellitus with hyperglycemia: Secondary | ICD-10-CM

## 2023-12-29 ENCOUNTER — Telehealth: Payer: Self-pay | Admitting: Primary Care

## 2023-12-29 NOTE — Telephone Encounter (Signed)
Copied from CRM [id number]. Topic: Clinical - Medical Advice >> Dec 29, 2023  8:43 AM Pascal Lux wrote: Reason for CRM: Adam Jennings patient daughter called asking what can her father take regarding constipation. [phone]

## 2023-12-29 NOTE — Telephone Encounter (Signed)
Called daughter on dpr. Left message to call office.

## 2023-12-29 NOTE — Telephone Encounter (Signed)
 MiraLAX which is a powder mixed into 8 ounces of liquid.  He can take this daily, every other day, a few times a week.  Another option includes a stool softener such as docusate sodium .  This can be taken daily.  Increased intake of fiber rich foods.  Daily walking/exercise.

## 2023-12-30 NOTE — Telephone Encounter (Signed)
Called patients daughter and reviewed all information. She verbalized understanding. Will call if any further questions.  

## 2024-01-04 ENCOUNTER — Other Ambulatory Visit: Payer: Self-pay | Admitting: Urology

## 2024-01-08 ENCOUNTER — Other Ambulatory Visit (INDEPENDENT_AMBULATORY_CARE_PROVIDER_SITE_OTHER): Payer: Medicare HMO

## 2024-01-08 DIAGNOSIS — I10 Essential (primary) hypertension: Secondary | ICD-10-CM | POA: Diagnosis not present

## 2024-01-08 LAB — BASIC METABOLIC PANEL
BUN: 26 mg/dL — ABNORMAL HIGH (ref 6–23)
CO2: 29 meq/L (ref 19–32)
Calcium: 9.1 mg/dL (ref 8.4–10.5)
Chloride: 105 meq/L (ref 96–112)
Creatinine, Ser: 1.43 mg/dL (ref 0.40–1.50)
GFR: 47.73 mL/min — ABNORMAL LOW (ref >60.00)
Glucose, Bld: 98 mg/dL (ref 70–99)
Potassium: 4.5 meq/L (ref 3.5–5.1)
Sodium: 139 meq/L (ref 135–145)

## 2024-01-25 NOTE — Progress Notes (Signed)
 This encounter was created in error - please disregard.

## 2024-02-17 ENCOUNTER — Other Ambulatory Visit: Payer: Self-pay | Admitting: Primary Care

## 2024-02-17 DIAGNOSIS — E785 Hyperlipidemia, unspecified: Secondary | ICD-10-CM

## 2024-03-07 DIAGNOSIS — R0609 Other forms of dyspnea: Secondary | ICD-10-CM | POA: Diagnosis not present

## 2024-03-07 DIAGNOSIS — I480 Paroxysmal atrial fibrillation: Secondary | ICD-10-CM | POA: Diagnosis not present

## 2024-03-07 DIAGNOSIS — J90 Pleural effusion, not elsewhere classified: Secondary | ICD-10-CM | POA: Diagnosis not present

## 2024-03-07 DIAGNOSIS — I2510 Atherosclerotic heart disease of native coronary artery without angina pectoris: Secondary | ICD-10-CM | POA: Diagnosis not present

## 2024-03-07 DIAGNOSIS — I5020 Unspecified systolic (congestive) heart failure: Secondary | ICD-10-CM | POA: Diagnosis not present

## 2024-03-07 DIAGNOSIS — I10 Essential (primary) hypertension: Secondary | ICD-10-CM | POA: Diagnosis not present

## 2024-03-15 ENCOUNTER — Ambulatory Visit

## 2024-03-29 DIAGNOSIS — I480 Paroxysmal atrial fibrillation: Secondary | ICD-10-CM | POA: Diagnosis not present

## 2024-03-29 DIAGNOSIS — I5020 Unspecified systolic (congestive) heart failure: Secondary | ICD-10-CM | POA: Diagnosis not present

## 2024-04-19 DIAGNOSIS — I10 Essential (primary) hypertension: Secondary | ICD-10-CM | POA: Diagnosis not present

## 2024-04-19 DIAGNOSIS — I2510 Atherosclerotic heart disease of native coronary artery without angina pectoris: Secondary | ICD-10-CM | POA: Diagnosis not present

## 2024-04-19 DIAGNOSIS — I480 Paroxysmal atrial fibrillation: Secondary | ICD-10-CM | POA: Diagnosis not present

## 2024-04-19 DIAGNOSIS — I5020 Unspecified systolic (congestive) heart failure: Secondary | ICD-10-CM | POA: Diagnosis not present

## 2024-09-01 DIAGNOSIS — I10 Essential (primary) hypertension: Secondary | ICD-10-CM | POA: Diagnosis not present

## 2024-09-01 DIAGNOSIS — Z79899 Other long term (current) drug therapy: Secondary | ICD-10-CM | POA: Diagnosis not present

## 2024-09-01 DIAGNOSIS — I2510 Atherosclerotic heart disease of native coronary artery without angina pectoris: Secondary | ICD-10-CM | POA: Diagnosis not present

## 2024-09-01 DIAGNOSIS — I480 Paroxysmal atrial fibrillation: Secondary | ICD-10-CM | POA: Diagnosis not present

## 2024-09-01 DIAGNOSIS — E119 Type 2 diabetes mellitus without complications: Secondary | ICD-10-CM | POA: Diagnosis not present

## 2024-09-01 DIAGNOSIS — Z5181 Encounter for therapeutic drug level monitoring: Secondary | ICD-10-CM | POA: Diagnosis not present

## 2024-09-01 DIAGNOSIS — I5020 Unspecified systolic (congestive) heart failure: Secondary | ICD-10-CM | POA: Diagnosis not present

## 2024-09-13 DIAGNOSIS — E119 Type 2 diabetes mellitus without complications: Secondary | ICD-10-CM | POA: Diagnosis not present

## 2024-09-13 DIAGNOSIS — E039 Hypothyroidism, unspecified: Secondary | ICD-10-CM | POA: Diagnosis not present

## 2024-11-03 ENCOUNTER — Ambulatory Visit: Admitting: Urology

## 2024-11-03 VITALS — BP 121/67 | HR 56 | Ht 72.0 in | Wt 270.0 lb

## 2024-11-03 DIAGNOSIS — R310 Gross hematuria: Secondary | ICD-10-CM

## 2024-11-03 DIAGNOSIS — N401 Enlarged prostate with lower urinary tract symptoms: Secondary | ICD-10-CM | POA: Diagnosis not present

## 2024-11-03 DIAGNOSIS — N138 Other obstructive and reflux uropathy: Secondary | ICD-10-CM

## 2024-11-03 DIAGNOSIS — R319 Hematuria, unspecified: Secondary | ICD-10-CM | POA: Diagnosis not present

## 2024-11-03 LAB — MICROSCOPIC EXAMINATION

## 2024-11-03 LAB — URINALYSIS, COMPLETE
Bilirubin, UA: NEGATIVE
Ketones, UA: NEGATIVE
Leukocytes,UA: NEGATIVE
Nitrite, UA: NEGATIVE
Protein,UA: NEGATIVE
RBC, UA: NEGATIVE
Specific Gravity, UA: 1.015 (ref 1.005–1.030)
Urobilinogen, Ur: 0.2 mg/dL (ref 0.2–1.0)
pH, UA: 6 (ref 5.0–7.5)

## 2024-11-03 LAB — BLADDER SCAN AMB NON-IMAGING

## 2024-11-03 MED ORDER — FINASTERIDE 5 MG PO TABS: 5.0000 mg | ORAL_TABLET | Freq: Every day | ORAL | 3 refills | Status: AC

## 2024-11-03 NOTE — Progress Notes (Signed)
° °  11/03/2024 10:11 AM   Sharolyn Duos Mcerlean 02/07/1946 [id number]  Reason for visit: New gross hematuria, history of BPH, nocturia, PSA screening  History: 78 year old comorbid male with obesity, CAD, anticoagulated with Eliquis  Last seen July 2023 for BPH symptoms but never followed up.  Previously was doing very well on finasteride  but he has since discontinued that medication about 1 year ago because he did not think he needed it anymore Negative prostate biopsy for PSA of 7 in 2018, prostate was 250g at that time, cystoscopy January 2022 showed massive prostate with obstructing lateral lobes and friable tissue but benign bladder  Physical Exam: BP 121/67 (BP Location: Left Arm, Patient Position: Sitting, Cuff Size: Large)   Pulse (!) 56   Ht 6' (1.829 m)   Wt 270 lb (122.5 kg)   SpO2 96%   BMI 36.62 kg/m   Imaging/labs: UA today benign aside from glucosuria  Today: PVR today normal at Reports 1 episode of painless gross hematuria about a week ago that resolved spontaneously.  He does have a history of gross hematuria and has previously deferred workup Nocturia 1-2 times at night  Plan:   Gross hematuria: We discussed common possible etiologies of hematuria including BPH, malignancy, urolithiasis, medical renal disease, and idiopathic. Standard workup recommended by the AUA includes imaging with CT urogram to assess the upper tracts, and cystoscopy. Cytology is performed on patient's with gross hematuria to look for malignant cells in the urine.  He defers cystoscopy with negative cystoscopy in 2022, but was amenable to CT.  We discussed possible need for additional treatments pending CT findings. BPH: Recommended he go back on the finasteride  with his slightly worsened obstructive symptoms since stopping the finasteride , as well as his gross hematuria, discussed benefit of finasteride  to shrink prostate and prevent gross hematuria if related to BPH.  Will assess prostate volume  on upcoming CT. Finasteride  restarted, CT for gross hematuria workup, he defers cystoscopy at this time, call with results   Redell JAYSON Burnet, MD  [facility] [address], [address] [place], [place] [address] [phone]

## 2024-11-03 NOTE — Patient Instructions (Signed)
 Please call [phone] or [phone] to schedule your imaging prior to your appointment.

## 2024-12-14 ENCOUNTER — Ambulatory Visit
Admission: RE | Admit: 2024-12-14 | Discharge: 2024-12-14 | Disposition: A | Discharge status: Home / Self Care | Source: Ambulatory Visit | Attending: Urology | Admitting: Urology

## 2024-12-14 DIAGNOSIS — R310 Gross hematuria: Secondary | ICD-10-CM | POA: Insufficient documentation

## 2024-12-14 LAB — POCT I-STAT CREATININE: Creatinine, Ser: 1.4 mg/dL — ABNORMAL HIGH (ref 0.61–1.24)

## 2024-12-14 MED ORDER — IOHEXOL 300 MG/ML  SOLN
100.0000 mL | Freq: Once | INTRAMUSCULAR | Status: AC | PRN
  Administered 2024-12-14: 100 mL via INTRAVENOUS

## 2024-12-19 ENCOUNTER — Ambulatory Visit: Payer: Self-pay | Admitting: Urology

## 2025-12-20 ENCOUNTER — Ambulatory Visit: Admitting: Urology
